# Patient Record
Sex: Female | Born: 1985
Health system: Southern US, Community
[De-identification: ages and names within clinical notes are randomized; demographics above are authoritative.]

## PROBLEM LIST (undated history)

## (undated) DIAGNOSIS — H5501 Congenital nystagmus: Secondary | ICD-10-CM

## (undated) DIAGNOSIS — C9590 Leukemia, unspecified not having achieved remission: Secondary | ICD-10-CM

## (undated) DIAGNOSIS — Q909 Down syndrome, unspecified: Secondary | ICD-10-CM

## (undated) DIAGNOSIS — C801 Malignant (primary) neoplasm, unspecified: Secondary | ICD-10-CM

## (undated) DIAGNOSIS — R011 Cardiac murmur, unspecified: Secondary | ICD-10-CM

## (undated) HISTORY — DX: Down syndrome, unspecified: Q90.9

## (undated) HISTORY — DX: Congenital nystagmus: H55.01

## (undated) HISTORY — DX: Leukemia, unspecified not having achieved remission: C95.90

## (undated) HISTORY — PX: HERNIA REPAIR: SHX51

## (undated) HISTORY — DX: Cardiac murmur, unspecified: R01.1

---
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
## 2004-01-07 ENCOUNTER — Encounter: Admission: RE | Admit: 2004-01-07 | Discharge: 2004-01-07 | Payer: Self-pay | Admitting: General Surgery

## 2004-07-10 ENCOUNTER — Ambulatory Visit (HOSPITAL_COMMUNITY): Admission: RE | Admit: 2004-07-10 | Discharge: 2004-07-10 | Payer: Self-pay | Admitting: General Surgery

## 2004-07-12 ENCOUNTER — Inpatient Hospital Stay (HOSPITAL_COMMUNITY): Admission: RE | Admit: 2004-07-12 | Discharge: 2004-07-18 | Payer: Self-pay | Admitting: General Surgery

## 2011-07-30 ENCOUNTER — Other Ambulatory Visit: Payer: Self-pay | Admitting: Endocrinology

## 2011-07-30 DIAGNOSIS — E0590 Thyrotoxicosis, unspecified without thyrotoxic crisis or storm: Secondary | ICD-10-CM

## 2011-08-02 ENCOUNTER — Encounter (HOSPITAL_COMMUNITY)
Admission: RE | Admit: 2011-08-02 | Discharge: 2011-08-02 | Disposition: A | Discharge status: Home / Self Care | Payer: Medicaid Other | Source: Ambulatory Visit | Attending: Endocrinology | Admitting: Endocrinology

## 2011-08-02 DIAGNOSIS — E0590 Thyrotoxicosis, unspecified without thyrotoxic crisis or storm: Secondary | ICD-10-CM

## 2011-08-02 MED ORDER — SODIUM IODIDE I 131 CAPSULE
6.0000 | Freq: Once | INTRAVENOUS | Status: AC | PRN
  Administered 2011-08-02: 6 via ORAL

## 2011-08-03 ENCOUNTER — Encounter (HOSPITAL_COMMUNITY): Payer: Self-pay

## 2011-08-03 ENCOUNTER — Encounter (HOSPITAL_COMMUNITY)
Admission: RE | Admit: 2011-08-03 | Discharge: 2011-08-03 | Disposition: A | Discharge status: Home / Self Care | Payer: Medicaid Other | Source: Ambulatory Visit | Attending: Endocrinology | Admitting: Endocrinology

## 2011-08-03 HISTORY — DX: Malignant (primary) neoplasm, unspecified: C80.1

## 2011-08-03 MED ORDER — SODIUM PERTECHNETATE TC 99M INJECTION
10.0000 | Freq: Once | INTRAVENOUS | Status: AC | PRN
  Administered 2011-08-03: 10.7 via INTRAVENOUS

## 2011-08-07 ENCOUNTER — Other Ambulatory Visit: Payer: Self-pay | Admitting: Endocrinology

## 2011-08-07 DIAGNOSIS — E0590 Thyrotoxicosis, unspecified without thyrotoxic crisis or storm: Secondary | ICD-10-CM

## 2011-08-17 ENCOUNTER — Encounter (HOSPITAL_COMMUNITY)
Admission: RE | Admit: 2011-08-17 | Discharge: 2011-08-17 | Disposition: A | Discharge status: Home / Self Care | Payer: Medicaid Other | Source: Ambulatory Visit | Attending: Endocrinology | Admitting: Endocrinology

## 2011-08-17 DIAGNOSIS — E0500 Thyrotoxicosis with diffuse goiter without thyrotoxic crisis or storm: Secondary | ICD-10-CM | POA: Insufficient documentation

## 2011-08-17 DIAGNOSIS — E0590 Thyrotoxicosis, unspecified without thyrotoxic crisis or storm: Secondary | ICD-10-CM

## 2011-08-17 MED ORDER — SODIUM IODIDE I 131 CAPSULE
24.3000 | Freq: Once | INTRAVENOUS | Status: AC | PRN
  Administered 2011-08-17: 24.3 via ORAL

## 2012-04-14 ENCOUNTER — Other Ambulatory Visit: Payer: Self-pay | Admitting: *Deleted

## 2012-04-14 MED ORDER — METFORMIN HCL 500 MG PO TABS: ORAL_TABLET | ORAL | Status: DC

## 2012-04-14 NOTE — Telephone Encounter (Signed)
LAST AIC 6/13 5.6

## 2012-04-14 NOTE — Telephone Encounter (Signed)
This patient is to be seen by someone in the next month.Please schedule appointment for him

## 2012-05-19 ENCOUNTER — Other Ambulatory Visit: Payer: Self-pay

## 2012-05-19 MED ORDER — METFORMIN HCL 500 MG PO TABS: ORAL_TABLET | ORAL | Status: DC

## 2012-05-19 NOTE — Telephone Encounter (Signed)
Last seen 06/13/11  Last refilled and told needed to be seen  No upcoming appt scheduled

## 2012-06-23 ENCOUNTER — Other Ambulatory Visit: Payer: Self-pay | Admitting: *Deleted

## 2012-06-23 MED ORDER — METFORMIN HCL 500 MG PO TABS: ORAL_TABLET | ORAL | Status: DC

## 2012-06-23 NOTE — Telephone Encounter (Signed)
LAST LABS 6/13. NTBS

## 2012-07-15 ENCOUNTER — Ambulatory Visit (INDEPENDENT_AMBULATORY_CARE_PROVIDER_SITE_OTHER): Payer: Medicaid Other | Admitting: General Practice

## 2012-07-15 ENCOUNTER — Encounter: Payer: Self-pay | Admitting: General Practice

## 2012-07-15 VITALS — BP 118/77 | HR 85 | Temp 99.0°F | Wt 244.0 lb

## 2012-07-15 DIAGNOSIS — E559 Vitamin D deficiency, unspecified: Secondary | ICD-10-CM

## 2012-07-15 DIAGNOSIS — E119 Type 2 diabetes mellitus without complications: Secondary | ICD-10-CM

## 2012-07-15 DIAGNOSIS — E079 Disorder of thyroid, unspecified: Secondary | ICD-10-CM

## 2012-07-15 MED ORDER — METFORMIN HCL 500 MG PO TABS: ORAL_TABLET | ORAL | Status: DC

## 2012-07-15 NOTE — Progress Notes (Signed)
  Subjective:    Patient ID: Julie Carter, female    DOB: 1985-10-15, 27 y.o.   MRN: [id number]  HPI Patient presents today for chronic health follow up, she has down syndrome and accompanied by her mother. She has a history of hypothyroidism and diabetes. Patient's mother denies complaints or problems and patient is taking medications as prescribed.     Review of Systems  Constitutional: Negative for fever and chills.  HENT: Negative for neck pain and neck stiffness.   Respiratory: Negative for chest tightness and shortness of breath.   Cardiovascular: Negative for chest pain and palpitations.  Gastrointestinal: Negative for nausea, vomiting, abdominal pain and blood in stool.  Genitourinary: Negative for hematuria and difficulty urinating.  Neurological: Negative for dizziness, weakness and headaches.       Objective:   Physical Exam  Constitutional: She appears well-developed and well-nourished.  Patient isn't very verbal, but able to answer some questions appropriately  HENT:  Head: Normocephalic and atraumatic.  Eyes: Conjunctivae are normal.  Neck: Normal range of motion. Neck supple. No thyromegaly present.  Cardiovascular: Normal rate, regular rhythm and normal heart sounds.   Pulmonary/Chest: Effort normal and breath sounds normal. No respiratory distress. She exhibits no tenderness.  Abdominal: Soft. Bowel sounds are normal. She exhibits no distension. There is no tenderness.  Obese abdomen  Lymphadenopathy:    She has no cervical adenopathy.  Neurological: She is alert.  Oriented to self  Skin: Skin is warm and dry.  Psychiatric: She has a normal mood and affect.           Assessment & Plan:  1. Diabetes mellitus, controlled - COMPLETE METABOLIC PANEL WITH GFR - NMR Lipoprofile with Lipids - POCT glycosylated hemoglobin (Hb A1C) - metFORMIN (GLUCOPHAGE) 500 MG tablet; TAKE 1/2 TABLET BY MOUTH TWICE DAILY  Dispense: 30 tablet; Refill: 2  2. Thyroid  disorder - Thyroid Panel With TSH  3. Unspecified vitamin D deficiency - Vitamin D 25 hydroxy -Continue all current medications Labs pending, cmp, lipid panel F/u in 3 months Discussed exercise and healthy eating habits Patient's mother verbalized understanding Coralie Keens, FNP-C

## 2012-07-15 NOTE — Patient Instructions (Signed)

## 2012-07-16 LAB — COMPLETE METABOLIC PANEL WITH GFR
AST: 19 U/L (ref 0–37)
Albumin: 4 g/dL (ref 3.5–5.2)
BUN: 10 mg/dL (ref 6–23)
Calcium: 9.4 mg/dL (ref 8.4–10.5)
Chloride: 103 mEq/L (ref 96–112)
Creat: 0.81 mg/dL (ref 0.50–1.10)
GFR, Est African American: >89 mL/min
GFR, Est Non African American: >89 mL/min
Glucose, Bld: 86 mg/dL (ref 70–99)

## 2012-07-16 LAB — NMR LIPOPROFILE WITH LIPIDS
HDL Size: 9.4 nm (ref >=9.2)
LDL (calc): 76 mg/dL (ref <100)
LDL Particle Number: 1059 nmol/L — ABNORMAL HIGH (ref <1000)
LDL Size: 20.9 nm (ref >20.5)
LP-IR Score: 51 — ABNORMAL HIGH (ref <=45)
Large VLDL-P: 6 nmol/L — ABNORMAL HIGH (ref <=2.7)
Small LDL Particle Number: 491 nmol/L (ref <=527)
VLDL Size: 47.9 nm — ABNORMAL HIGH (ref <=46.6)

## 2012-07-22 ENCOUNTER — Other Ambulatory Visit: Payer: Self-pay | Admitting: General Practice

## 2012-07-22 DIAGNOSIS — E559 Vitamin D deficiency, unspecified: Secondary | ICD-10-CM

## 2012-07-22 MED ORDER — VITAMIN D3 1.25 MG (50000 UT) PO CAPS: 1.0000 | ORAL_CAPSULE | ORAL | Status: DC

## 2012-07-22 NOTE — Progress Notes (Signed)
Patient mom aware.

## 2012-09-12 ENCOUNTER — Telehealth: Payer: Self-pay | Admitting: Family Medicine

## 2012-09-12 ENCOUNTER — Other Ambulatory Visit: Payer: Self-pay | Admitting: *Deleted

## 2012-09-12 MED ORDER — GLUCOSE BLOOD VI STRP: ORAL_STRIP | Status: DC

## 2012-09-26 ENCOUNTER — Other Ambulatory Visit: Payer: Self-pay

## 2012-09-26 DIAGNOSIS — E119 Type 2 diabetes mellitus without complications: Secondary | ICD-10-CM

## 2012-09-26 MED ORDER — METFORMIN HCL 500 MG PO TABS: ORAL_TABLET | ORAL | Status: DC

## 2012-10-03 ENCOUNTER — Other Ambulatory Visit: Payer: Self-pay | Admitting: *Deleted

## 2012-10-03 DIAGNOSIS — E039 Hypothyroidism, unspecified: Secondary | ICD-10-CM

## 2012-10-03 DIAGNOSIS — E119 Type 2 diabetes mellitus without complications: Secondary | ICD-10-CM

## 2012-10-03 DIAGNOSIS — E890 Postprocedural hypothyroidism: Secondary | ICD-10-CM | POA: Insufficient documentation

## 2012-10-09 ENCOUNTER — Other Ambulatory Visit (INDEPENDENT_AMBULATORY_CARE_PROVIDER_SITE_OTHER): Payer: Medicaid Other

## 2012-10-09 DIAGNOSIS — E039 Hypothyroidism, unspecified: Secondary | ICD-10-CM

## 2012-10-14 ENCOUNTER — Encounter: Payer: Self-pay | Admitting: Endocrinology

## 2012-10-14 ENCOUNTER — Ambulatory Visit (INDEPENDENT_AMBULATORY_CARE_PROVIDER_SITE_OTHER): Payer: Medicaid Other | Admitting: Endocrinology

## 2012-10-14 VITALS — BP 118/52 | HR 86 | Temp 98.2°F | Resp 12 | Ht <= 58 in | Wt 243.7 lb

## 2012-10-14 DIAGNOSIS — E039 Hypothyroidism, unspecified: Secondary | ICD-10-CM

## 2012-10-14 NOTE — Patient Instructions (Signed)
No change 

## 2012-10-14 NOTE — Progress Notes (Signed)
Patient ID: Julie Carter, female   DOB: [birth year], 27 y.o.   MRN: [id number]  Reason for Appointment:  Hypothyroidism, followup visit    History of Present Illness:   The hypothyroidism was first diagnosed in  2013 after her I-131 treatment for Graves' disease in 08/2011  She has had some increase in her thyroid dosage done after initial therapy was started Difficult to assess her symptoms because of her mental retardation She has difficulty with her obesity and tends to gain weight Currently the patient does not complain of unusual sleepiness or cold intolerance Previous records are currently not available, her dose was not changed on her last visit about 6 months ago         Compliance with the medical regimen has been as prescribed with taking the tablet in the morning before breakfast.  Appointment on 10/09/2012  Component Date Value Range Status  . TSH 10/09/2012 1.05  0.35 - 5.50 uIU/mL Final  . Free T4 10/09/2012 1.21  0.60 - 1.60 ng/dL Final      Medication List       This list is accurate as of: 10/14/12  9:32 AM.  Always use your most recent med list.               glucose blood test strip  Use as instructed     levothyroxine 137 MCG tablet  Commonly known as:  SYNTHROID, LEVOTHROID  Take 137 mcg by mouth daily before breakfast. Take 1 pill every day and 2 pills on Sunday     metFORMIN 500 MG tablet  Commonly known as:  GLUCOPHAGE  TAKE 1/2 TABLET BY MOUTH TWICE DAILY        Past Medical History  Diagnosis Date  . Cancer   . Diabetes mellitus     No past surgical history on file.  No family history on file.  Social History:  reports that she has never smoked. She does not have any smokeless tobacco history on file. Her alcohol and drug histories are not on file.  Allergies: No Known Allergies  REVIEW of systems:  She has ha mild diabetes followed by PCP, apparently blood sugars are fairly good at home checked by her mother   Examination:    BP 118/52  Pulse 86  Temp(Src) 98.2 F (36.8 C)  Resp 12  Ht 4' 8.5" (1.435 m)  Wt 243 lb 11.2 oz (110.542 kg)  BMI 53.68 kg/m2  SpO2 98%  LMP 06/14/2012   GENERAL APPEARANCE:  has generalized obesity, she is alert  FACE: No puffiness of face or periorbital edema.         NECK:  no mass palpable in the neck         NEUROLOGIC EXAM: DTRs appear normal bilaterally at biceps, slightly difficult to elicit.    Assessments   Hypothyroidism, post ablative with normal TSH and no clinical symptoms of hypothyroidism She has been very compliant with her thyroid supplement    Treatment:   Continue same dosage before breakfast daily. Avoid taking any calcium or iron supplements with the thyroid supplement.   Followup annually  [facility] 10/14/2012, 9:32 AM

## 2012-10-16 ENCOUNTER — Encounter: Payer: Self-pay | Admitting: General Practice

## 2012-10-16 ENCOUNTER — Ambulatory Visit (INDEPENDENT_AMBULATORY_CARE_PROVIDER_SITE_OTHER): Payer: Medicaid Other | Admitting: General Practice

## 2012-10-16 VITALS — BP 134/71 | HR 68 | Temp 97.3°F | Ht <= 58 in | Wt 243.5 lb

## 2012-10-16 DIAGNOSIS — E119 Type 2 diabetes mellitus without complications: Secondary | ICD-10-CM

## 2012-10-16 MED ORDER — METFORMIN HCL 500 MG PO TABS: ORAL_TABLET | ORAL | Status: DC

## 2012-10-16 MED ORDER — GLUCOSE BLOOD VI STRP: ORAL_STRIP | Status: DC

## 2012-10-16 NOTE — Patient Instructions (Signed)

## 2012-10-16 NOTE — Progress Notes (Signed)
  Subjective:    Patient ID: Julie Carter, female    DOB: 02-09-1985, 27 y.o.   MRN: [id number]  HPI Patient presents today for 3 month chronic health follow up. Julie Carter has Down Syndrome and is accompanied by her mother. Julie Carter has a history of hyperthyroidism and diabetes type II. Thyroid disease is managed by endocrinologist. Patient's mother reports medications are taken as prescribed. Julie Carter denies any physical activity, she enjoys playing her ipad. Mom reports trying to encourage Julie Carter to walk, due to her obese size and better for health, but doesn't have much success. Julie Carter does respond to some questions appropriately with yes or no answers.     Review of Systems  Constitutional: Negative for fever and chills.  Respiratory: Negative for cough, chest tightness, shortness of breath and wheezing.   Cardiovascular: Negative for chest pain and palpitations.  Gastrointestinal: Negative for abdominal pain, diarrhea, constipation and blood in stool.       Obese abdomen  Genitourinary: Negative for dysuria, hematuria and difficulty urinating.  Musculoskeletal: Negative for back pain, neck pain and neck stiffness.  Neurological: Negative for dizziness, weakness and headaches.       Objective:   Physical Exam  Constitutional: She appears well-developed and well-nourished.  Patient isn't very verbal, but able to answer some questions appropriately  HENT:  Head: Normocephalic and atraumatic.  Eyes: Conjunctivae are normal.  Neck: Normal range of motion. Neck supple. No thyromegaly present.  Cardiovascular: Normal rate, regular rhythm and normal heart sounds.   Pulmonary/Chest: Effort normal and breath sounds normal. No respiratory distress. She exhibits no tenderness.  Abdominal: Soft. Bowel sounds are normal. She exhibits no distension. There is no tenderness.  Obese abdomen  Lymphadenopathy:    She has no cervical adenopathy.  Neurological: She is alert.  Oriented to self  Skin:  Skin is warm and dry.  Psychiatric: She has a normal mood and affect.          Assessment & Plan:  1. Diabetes  - POCT glycosylated hemoglobin (Hb A1C) - CMP14+EGFR  2. Diabetes mellitus, controlled  - glucose blood test strip; Use as instructed  Dispense: 100 each; Refill: 12 - metFORMIN (GLUCOPHAGE) 500 MG tablet; TAKE 1/2 TABLET BY MOUTH TWICE DAILY  Dispense: 30 tablet; Refill: 3 -Continue all current medications Labs pending, cmp, lipid panel F/u in 3 months Discussed exercise and diet  Encouraged her to walk for exercise as tolerated Patient's mother verbalized understanding Coralie Keens, FNP-C

## 2012-10-17 LAB — CMP14+EGFR
Albumin: 4.3 g/dL (ref 3.5–5.5)
Alkaline Phosphatase: 88 IU/L (ref 39–117)
BUN/Creatinine Ratio: 12 (ref 8–20)
BUN: 9 mg/dL (ref 6–20)
Chloride: 100 mmol/L (ref 97–108)
Creatinine, Ser: 0.78 mg/dL (ref 0.57–1.00)
GFR calc Af Amer: 120 mL/min/{1.73_m2} (ref >59)
Globulin, Total: 2.8 g/dL (ref 1.5–4.5)
Glucose: 84 mg/dL (ref 65–99)
Total Bilirubin: 0.2 mg/dL (ref 0.0–1.2)
Total Protein: 7.1 g/dL (ref 6.0–8.5)

## 2012-10-23 ENCOUNTER — Encounter: Payer: Self-pay | Admitting: *Deleted

## 2012-10-28 ENCOUNTER — Encounter: Payer: Self-pay | Admitting: *Deleted

## 2013-02-13 ENCOUNTER — Other Ambulatory Visit: Payer: Self-pay | Admitting: *Deleted

## 2013-02-13 MED ORDER — LEVOTHYROXINE SODIUM 137 MCG PO TABS: 137.0000 ug | ORAL_TABLET | Freq: Every day | ORAL | Status: DC

## 2013-03-04 ENCOUNTER — Encounter: Payer: Self-pay | Admitting: *Deleted

## 2013-04-15 ENCOUNTER — Ambulatory Visit (INDEPENDENT_AMBULATORY_CARE_PROVIDER_SITE_OTHER): Payer: Medicaid Other | Admitting: General Practice

## 2013-04-15 ENCOUNTER — Encounter: Payer: Self-pay | Admitting: General Practice

## 2013-04-15 VITALS — BP 127/74 | HR 85 | Temp 98.2°F | Ht <= 58 in | Wt 259.6 lb

## 2013-04-15 DIAGNOSIS — E119 Type 2 diabetes mellitus without complications: Secondary | ICD-10-CM

## 2013-04-15 LAB — POCT CBC
Granulocyte percent: 76.2 %G (ref 37–80)
HCT, POC: 39.4 % (ref 37.7–47.9)
Hemoglobin: 13 g/dL (ref 12.2–16.2)
LYMPH, POC: 1.4 (ref 0.6–3.4)
MCH, POC: 30.9 pg (ref 27–31.2)
MCHC: 32.9 g/dL (ref 31.8–35.4)
MCV: 93.8 fL (ref 80–97)
MPV: 6.9 fL (ref 0–99.8)
PLATELET COUNT, POC: 371 10*3/uL (ref 142–424)
POC GRANULOCYTE: 5 (ref 2–6.9)
POC LYMPH PERCENT: 20.8 %L (ref 10–50)
RBC: 4.2 M/uL (ref 4.04–5.48)
RDW, POC: 16.1 %
WBC: 6.6 10*3/uL (ref 4.6–10.2)

## 2013-04-15 LAB — POCT GLYCOSYLATED HEMOGLOBIN (HGB A1C)

## 2013-04-15 NOTE — Patient Instructions (Signed)

## 2013-04-15 NOTE — Progress Notes (Signed)
   Subjective:    Patient ID: Julie Carter, female    DOB: 1985/12/06, 28 y.o.   MRN: [id number]  HPI Patient presents today for 3 month chronic health follow up. Julie Carter has Down Syndrome and is accompanied by her mother. History of thyroid and diabetes type II. Thyroid disease is managed by endocrinologist. Patient's mother reports medications are taken as prescribed. Blood sugars checked daily and range 120's. Julie Carter does respond to some questions appropriately with yes or no answers. Mother denies regular physical activity, tries to provide healthy foods.      Review of Systems  Constitutional: Negative for fever and chills.  Respiratory: Negative for cough, chest tightness, shortness of breath and wheezing.   Cardiovascular: Negative for chest pain and palpitations.  Gastrointestinal: Negative for abdominal pain, diarrhea, constipation and blood in stool.       Obese abdomen  Genitourinary: Negative for dysuria, hematuria and difficulty urinating.  Musculoskeletal: Negative for back pain, neck pain and neck stiffness.  Neurological: Negative for dizziness, weakness and headaches.       Objective:   Physical Exam  Constitutional: She appears well-developed and well-nourished.  Patient isn't very verbal, but able to answer some questions appropriately  HENT:  Head: Normocephalic and atraumatic.  Eyes: Conjunctivae are normal.  Neck: Normal range of motion. Neck supple. No thyromegaly present.  Cardiovascular: Normal rate, regular rhythm and normal heart sounds.   Pulmonary/Chest: Effort normal and breath sounds normal. No respiratory distress. She exhibits no tenderness.  Abdominal: Soft. Bowel sounds are normal. She exhibits no distension. There is no tenderness.  Obese abdomen  Lymphadenopathy:    She has no cervical adenopathy.  Neurological: She is alert.  Oriented to self  Skin: Skin is warm and dry.  Psychiatric: She has a normal mood and affect.            Assessment & Plan:  1. Diabetes mellitus, type 2 - POCT CBC - CMP14+EGFR - POCT glycosylated hemoglobin (Hb A1C) - Lipid panel Continue all current medications Labs pending F/u in 3 months Discussed benefits of regular exercise and healthy eating Patient's guardian verbalized understanding Erby Pian, FNP-C

## 2013-04-16 ENCOUNTER — Ambulatory Visit: Payer: Medicaid Other | Admitting: General Practice

## 2013-04-16 LAB — CMP14+EGFR
ALBUMIN: 4.3 g/dL (ref 3.5–5.5)
ALK PHOS: 81 IU/L (ref 39–117)
ALT: 48 IU/L — ABNORMAL HIGH (ref 0–32)
AST: 28 IU/L (ref 0–40)
Albumin/Globulin Ratio: 1.4 (ref 1.1–2.5)
BUN / CREAT RATIO: 13 (ref 8–20)
BUN: 11 mg/dL (ref 6–20)
CHLORIDE: 102 mmol/L (ref 97–108)
CO2: 26 mmol/L (ref 18–29)
Calcium: 9.6 mg/dL (ref 8.7–10.2)
Creatinine, Ser: 0.84 mg/dL (ref 0.57–1.00)
GFR calc Af Amer: 109 mL/min/{1.73_m2} (ref >59)
GFR calc non Af Amer: 94 mL/min/{1.73_m2} (ref >59)
Globulin, Total: 3 g/dL (ref 1.5–4.5)
Glucose: 134 mg/dL — ABNORMAL HIGH (ref 65–99)
POTASSIUM: 4.9 mmol/L (ref 3.5–5.2)
Sodium: 144 mmol/L (ref 134–144)
Total Bilirubin: 0.3 mg/dL (ref 0.0–1.2)
Total Protein: 7.3 g/dL (ref 6.0–8.5)

## 2013-04-16 LAB — LIPID PANEL
Chol/HDL Ratio: 3.2 ratio units (ref 0.0–4.4)
Cholesterol, Total: 162 mg/dL (ref 100–199)
HDL: 50 mg/dL (ref >39)
LDL Calculated: 86 mg/dL (ref 0–99)
Triglycerides: 131 mg/dL (ref 0–149)
VLDL Cholesterol Cal: 26 mg/dL (ref 5–40)

## 2013-04-17 ENCOUNTER — Ambulatory Visit: Payer: Medicaid Other | Admitting: General Practice

## 2013-04-17 ENCOUNTER — Encounter: Payer: Self-pay | Admitting: General Practice

## 2013-04-22 ENCOUNTER — Other Ambulatory Visit: Payer: Self-pay | Admitting: *Deleted

## 2013-04-22 DIAGNOSIS — E119 Type 2 diabetes mellitus without complications: Secondary | ICD-10-CM

## 2013-04-22 MED ORDER — METFORMIN HCL 500 MG PO TABS: ORAL_TABLET | ORAL | Status: DC

## 2013-06-26 ENCOUNTER — Other Ambulatory Visit: Payer: Self-pay | Admitting: *Deleted

## 2013-06-26 DIAGNOSIS — E119 Type 2 diabetes mellitus without complications: Secondary | ICD-10-CM

## 2013-06-26 MED ORDER — METFORMIN HCL 500 MG PO TABS: ORAL_TABLET | ORAL | Status: DC

## 2013-08-27 ENCOUNTER — Other Ambulatory Visit: Payer: Self-pay | Admitting: Nurse Practitioner

## 2013-08-28 NOTE — Telephone Encounter (Signed)
Last seen and last glucose 04/15/13  Julie Carter

## 2013-08-31 NOTE — Telephone Encounter (Signed)
Patient NTBS for follow up and lab work  

## 2013-10-02 ENCOUNTER — Other Ambulatory Visit: Payer: Self-pay | Admitting: Nurse Practitioner

## 2013-10-05 NOTE — Telephone Encounter (Signed)
no more refills without being seen  

## 2013-10-12 ENCOUNTER — Other Ambulatory Visit: Payer: Medicaid Other

## 2013-10-13 ENCOUNTER — Other Ambulatory Visit (INDEPENDENT_AMBULATORY_CARE_PROVIDER_SITE_OTHER): Payer: Medicaid Other

## 2013-10-13 DIAGNOSIS — E039 Hypothyroidism, unspecified: Secondary | ICD-10-CM

## 2013-10-13 LAB — TSH: TSH: 1.16 u[IU]/mL (ref 0.35–4.50)

## 2013-10-13 LAB — T4, FREE: Free T4: 1.42 ng/dL (ref 0.60–1.60)

## 2013-10-14 ENCOUNTER — Ambulatory Visit (INDEPENDENT_AMBULATORY_CARE_PROVIDER_SITE_OTHER): Payer: Medicaid Other | Admitting: Endocrinology

## 2013-10-14 ENCOUNTER — Encounter: Payer: Self-pay | Admitting: Endocrinology

## 2013-10-14 VITALS — BP 116/70 | HR 93 | Temp 98.0°F | Resp 14 | Ht <= 58 in | Wt 264.0 lb

## 2013-10-14 DIAGNOSIS — E890 Postprocedural hypothyroidism: Secondary | ICD-10-CM

## 2013-10-14 NOTE — Progress Notes (Signed)
Patient ID: Julie Carter, female   DOB: 1985/09/21, 28 y.o.   MRN: [id number]  Reason for Appointment:  Hypothyroidism, followup visit    History of Present Illness:   The hypothyroidism was first diagnosed in  2013 after her I-131 treatment for Graves' disease in 08/2011  She did need an increase in her thyroid dosage done after initial therapy was started Difficult to assess her symptoms because of her mental retardation She has difficulty with her obesity and continues to gain weight Currently the patient does not complain of unusual fatigue or cold intolerance  Her dose was not changed on her visit a year ago          Compliance with the medical regimen has been as prescribed with taking the tablet in the morning before breakfast. Not on any iron or vitamins Her TSH is again fairly good  Appointment on 10/13/2013  Component Date Value Ref Range Status  . Free T4 10/13/2013 1.42  0.60 - 1.60 ng/dL Final  . TSH 10/13/2013 1.16  0.35 - 4.50 uIU/mL Final   Lab Results  Component Value Date   FREET4 1.42 10/13/2013   FREET4 1.21 10/09/2012   TSH 1.16 10/13/2013   TSH 1.05 10/09/2012     Medication List       This list is accurate as of: 10/14/13 11:19 AM.  Always use your most recent med list.               glucose blood test strip  Use as instructed     levothyroxine 137 MCG tablet  Commonly known as:  SYNTHROID, LEVOTHROID  Take 1 tablet (137 mcg total) by mouth daily before breakfast. Take 1 pill every day and 2 pills on Sunday     metFORMIN 500 MG tablet  Commonly known as:  GLUCOPHAGE  TAKE ONE-HALF (1/2) TABLET (250MG ) BY MOUTH TWICE DAILY        Past Medical History  Diagnosis Date  . Cancer   . Diabetes mellitus   . Down's syndrome   . Congenital nystagmus   . Leukemia   . Heart murmur     Past Surgical History  Procedure Laterality Date  . Hernia repair      umbilical    Family History  Problem Relation Age of Onset  . Diabetes Mother    . Hypertension Mother     Social History:  reports that she has never smoked. She does not have any smokeless tobacco history on file. She reports that she does not drink alcohol or use illicit drugs.  Allergies: No Known Allergies  REVIEW of systems:  She has has mild diabetes followed by PCP, apparently blood sugars are fairly good at home checked by her mother 120-130   Examination:   BP 116/70  Pulse 93  Temp(Src) 98 F (36.7 C)  Resp 14  Ht 4' 8.5" (1.435 m)  Wt 264 lb (119.75 kg)  BMI 58.15 kg/m2  SpO2 93%   GENERAL APPEARANCE:  has generalized obesity, she is  Communicative but not very alert    No puffiness of face or  parents             Assessments   Hypothyroidism, post ablative with now consistently  normal TSH and no clinical symptoms of hypothyroidism She has been very compliant with her thyroid supplement    Obesity: She has gained about 20 pounds in one year and advised her to discuss this with her PCP   Treatment:  Continue same dosage before breakfast daily. Avoid taking any calcium or iron supplements with the thyroid supplement.   Followup annually  [facility] 10/14/2013, 11:19 AM

## 2013-10-14 NOTE — Patient Instructions (Signed)
   Continue same dosage before breakfast daily. Avoid taking any calcium or iron supplements with the thyroid supplement.   Followup annually

## 2013-11-07 ENCOUNTER — Other Ambulatory Visit: Payer: Self-pay | Admitting: Nurse Practitioner

## 2013-12-04 ENCOUNTER — Telehealth: Payer: Self-pay | Admitting: Endocrinology

## 2013-12-04 ENCOUNTER — Other Ambulatory Visit: Payer: Self-pay | Admitting: *Deleted

## 2013-12-04 MED ORDER — LEVOTHYROXINE SODIUM 137 MCG PO TABS: ORAL_TABLET | ORAL | Status: DC

## 2013-12-04 NOTE — Telephone Encounter (Signed)
Patient need refill of Levothroixine

## 2013-12-04 NOTE — Telephone Encounter (Signed)
rx sent

## 2014-01-04 ENCOUNTER — Other Ambulatory Visit: Payer: Self-pay | Admitting: Family Medicine

## 2014-01-05 ENCOUNTER — Telehealth: Payer: Self-pay | Admitting: Family

## 2014-01-05 MED ORDER — METFORMIN HCL 500 MG PO TABS: 250.0000 mg | ORAL_TABLET | Freq: Two times a day (BID) | ORAL | Status: DC

## 2014-01-05 NOTE — Telephone Encounter (Signed)
done

## 2014-01-16 ENCOUNTER — Other Ambulatory Visit: Payer: Self-pay | Admitting: Nurse Practitioner

## 2014-01-16 MED ORDER — AMOXICILLIN 400 MG/5ML PO SUSR: ORAL | Status: DC

## 2014-02-02 ENCOUNTER — Encounter: Payer: Self-pay | Admitting: Family

## 2014-02-02 ENCOUNTER — Ambulatory Visit (INDEPENDENT_AMBULATORY_CARE_PROVIDER_SITE_OTHER): Payer: Medicaid Other | Admitting: Family

## 2014-02-02 VITALS — BP 127/77 | HR 95 | Temp 96.7°F | Ht <= 58 in | Wt 265.4 lb

## 2014-02-02 DIAGNOSIS — E119 Type 2 diabetes mellitus without complications: Secondary | ICD-10-CM

## 2014-02-02 DIAGNOSIS — Z1321 Encounter for screening for nutritional disorder: Secondary | ICD-10-CM

## 2014-02-02 DIAGNOSIS — Q909 Down syndrome, unspecified: Secondary | ICD-10-CM

## 2014-02-02 DIAGNOSIS — E890 Postprocedural hypothyroidism: Secondary | ICD-10-CM

## 2014-02-02 LAB — POCT GLYCOSYLATED HEMOGLOBIN (HGB A1C): HEMOGLOBIN A1C: 7

## 2014-02-02 LAB — POCT UA - MICROALBUMIN: MICROALBUMIN (UR) POC: 20 mg/L

## 2014-02-02 MED ORDER — METFORMIN HCL 500 MG PO TABS: 250.0000 mg | ORAL_TABLET | Freq: Two times a day (BID) | ORAL | Status: DC

## 2014-02-02 NOTE — Patient Instructions (Signed)

## 2014-02-02 NOTE — Addendum Note (Signed)
Addended by: Earlene Plater on: 02/02/2014 12:42 PM   Modules accepted: Orders

## 2014-02-02 NOTE — Progress Notes (Signed)
Subjective:    Patient ID: Julie Carter, female    DOB: 1985/02/09, 29 y.o.   MRN: [id number]  Pt presents for a chronic follow up with lab work. Pt has down syndrome.  Diabetes She presents for her follow-up diabetic visit. She has type 2 diabetes mellitus. Her disease course has been stable. Pertinent negatives for hypoglycemia include no confusion, dizziness, headaches or nervousness/anxiousness. Pertinent negatives for diabetes include no blurred vision, no fatigue, no foot paresthesias, no foot ulcerations and no visual change. Pertinent negatives for hypoglycemia complications include no blackouts and no hospitalization. Symptoms are stable. Pertinent negatives for diabetic complications include no CVA, heart disease, nephropathy or peripheral neuropathy. Risk factors for coronary artery disease include obesity. Current diabetic treatment includes oral agent (monotherapy). She is compliant with treatment all of the time. Her weight is increasing steadily. She is following a generally unhealthy diet. She rarely participates in exercise. Her breakfast blood glucose range is generally 110-130 mg/dl. Eye exam is not current.  Thyroid Problem Presents for follow-up visit. Symptoms include dry skin. Patient reports no anxiety, constipation, depressed mood, diarrhea, fatigue, heat intolerance, nail problem, palpitations or visual change. The symptoms have been stable. Past treatments include levothyroxine. The treatment provided significant relief. Her past medical history is significant for diabetes and obesity. There is no history of hyperlipidemia.    *Pt has an endocrinology   that she see's once a year who manages her thyroid.   Review of Systems  Constitutional: Negative.  Negative for fatigue.  HENT: Negative.   Eyes: Negative.  Negative for blurred vision.  Respiratory: Negative.  Negative for shortness of breath.   Cardiovascular: Negative.  Negative for palpitations.    Gastrointestinal: Negative.  Negative for diarrhea and constipation.  Endocrine: Negative.  Negative for heat intolerance.  Genitourinary: Negative.   Musculoskeletal: Negative.   Neurological: Negative.  Negative for dizziness and headaches.  Hematological: Negative.   Psychiatric/Behavioral: Negative.  Negative for confusion. The patient is not nervous/anxious.   All other systems reviewed and are negative.      Objective:   Physical Exam  Constitutional: She is oriented to person, place, and time. She appears well-developed and well-nourished. No distress.  HENT:  Head: Normocephalic and atraumatic.  Right Ear: External ear normal.  Left Ear: External ear normal.  Nose: Nose normal.  Mouth/Throat: Oropharynx is clear and moist.  Eyes: Pupils are equal, round, and reactive to light.  Neck: Normal range of motion. Neck supple. No thyromegaly present.  Cardiovascular: Normal rate, regular rhythm, normal heart sounds and intact distal pulses.   No murmur heard. Pulmonary/Chest: Effort normal and breath sounds normal. No respiratory distress. She has no wheezes.  Abdominal: Soft. Bowel sounds are normal. She exhibits no distension. There is no tenderness.  Musculoskeletal: Normal range of motion. She exhibits no edema or tenderness.  Neurological: She is alert and oriented to person, place, and time. She has normal reflexes. No cranial nerve deficit.  Skin: Skin is warm and dry.  Psychiatric: She has a normal mood and affect. Her behavior is normal. Judgment and thought content normal.  Vitals reviewed.   BP 127/77 mmHg  Pulse 95  Temp(Src) 96.7 F (35.9 C) (Oral)  Ht 4' 8.5" (1.435 m)  Wt 265 lb 6.4 oz (120.385 kg)  BMI 58.46 kg/m2       Assessment & Plan:  1. Hypothyroidism, postradioiodine therapy - CMP14+EGFR  2. Type 2 diabetes mellitus without complication - POCT glycosylated hemoglobin (Hb A1C) -  POCT UA - Microalbumin - CMP14+EGFR - metFORMIN (GLUCOPHAGE)  500 MG tablet; Take 0.5 tablets (250 mg total) by mouth 2 (two) times daily.  Dispense: 90 tablet; Refill: 3  3. Encounter for vitamin deficiency screening - Vit D  25 hydroxy (rtn osteoporosis monitoring)  4. Down syndrome  5. Morbid obesity -Encouraged activity and daily exericse   Continue all meds Labs pending Health Maintenance reviewed- Pt encouraged to have eye exam Diet and exercise encouraged RTO 6 months  Evelina Dun, FNP

## 2014-02-03 ENCOUNTER — Other Ambulatory Visit: Payer: Self-pay | Admitting: Family

## 2014-02-03 DIAGNOSIS — E559 Vitamin D deficiency, unspecified: Secondary | ICD-10-CM

## 2014-02-03 LAB — CMP14+EGFR
ALK PHOS: 79 IU/L (ref 39–117)
ALT: 62 IU/L — ABNORMAL HIGH (ref 0–32)
AST: 34 IU/L (ref 0–40)
Albumin/Globulin Ratio: 1.3 (ref 1.1–2.5)
Albumin: 4 g/dL (ref 3.5–5.5)
BILIRUBIN TOTAL: 0.3 mg/dL (ref 0.0–1.2)
BUN/Creatinine Ratio: 12 (ref 8–20)
BUN: 9 mg/dL (ref 6–20)
CALCIUM: 9.6 mg/dL (ref 8.7–10.2)
CHLORIDE: 100 mmol/L (ref 97–108)
CO2: 26 mmol/L (ref 18–29)
CREATININE: 0.74 mg/dL (ref 0.57–1.00)
GFR, EST AFRICAN AMERICAN: 126 mL/min/{1.73_m2} (ref >59)
GFR, EST NON AFRICAN AMERICAN: 109 mL/min/{1.73_m2} (ref >59)
GLUCOSE: 134 mg/dL — AB (ref 65–99)
Globulin, Total: 3.1 g/dL (ref 1.5–4.5)
Potassium: 4.7 mmol/L (ref 3.5–5.2)
Sodium: 143 mmol/L (ref 134–144)
TOTAL PROTEIN: 7.1 g/dL (ref 6.0–8.5)

## 2014-02-03 LAB — VITAMIN D 25 HYDROXY (VIT D DEFICIENCY, FRACTURES): VIT D 25 HYDROXY: 12.6 ng/mL — AB (ref 30.0–100.0)

## 2014-02-03 LAB — MICROALBUMIN, URINE: Microalbumin, Urine: 4.6 ug/mL (ref 0.0–17.0)

## 2014-02-03 MED ORDER — VITAMIN D (ERGOCALCIFEROL) 1.25 MG (50000 UNIT) PO CAPS: 50000.0000 [IU] | ORAL_CAPSULE | ORAL | Status: DC

## 2014-10-11 ENCOUNTER — Other Ambulatory Visit: Payer: Medicaid Other

## 2014-10-15 ENCOUNTER — Encounter: Payer: Self-pay | Admitting: *Deleted

## 2014-10-15 ENCOUNTER — Telehealth: Payer: Self-pay | Admitting: Endocrinology

## 2014-10-15 ENCOUNTER — Ambulatory Visit: Payer: Medicaid Other | Admitting: Endocrinology

## 2014-10-15 NOTE — Telephone Encounter (Signed)
Patient no showed today's appt. Please advise on how to follow up. °A. No follow up necessary. °B. Follow up urgent. Contact patient immediately. °C. Follow up necessary. Contact patient and schedule visit in ___ days. °D. Follow up advised. Contact patient and schedule visit in ____weeks. ° °

## 2014-10-15 NOTE — Telephone Encounter (Signed)
Letter mailed

## 2014-10-18 NOTE — Telephone Encounter (Signed)
Needs to be rescheduled at first available appointment

## 2014-10-20 ENCOUNTER — Other Ambulatory Visit: Payer: Self-pay | Admitting: *Deleted

## 2014-10-20 ENCOUNTER — Other Ambulatory Visit (INDEPENDENT_AMBULATORY_CARE_PROVIDER_SITE_OTHER): Payer: Medicaid Other

## 2014-10-20 DIAGNOSIS — E890 Postprocedural hypothyroidism: Secondary | ICD-10-CM

## 2014-10-20 LAB — T4, FREE: FREE T4: 1.13 ng/dL (ref 0.60–1.60)

## 2014-10-20 LAB — TSH: TSH: 1.65 u[IU]/mL (ref 0.35–4.50)

## 2014-10-26 ENCOUNTER — Ambulatory Visit: Payer: Medicaid Other | Admitting: Endocrinology

## 2014-11-02 ENCOUNTER — Encounter: Payer: Self-pay | Admitting: Endocrinology

## 2014-11-02 ENCOUNTER — Ambulatory Visit (INDEPENDENT_AMBULATORY_CARE_PROVIDER_SITE_OTHER): Payer: Medicaid Other | Admitting: Endocrinology

## 2014-11-02 VITALS — BP 110/74 | HR 92 | Temp 98.6°F | Wt 259.1 lb

## 2014-11-02 DIAGNOSIS — Z23 Encounter for immunization: Secondary | ICD-10-CM

## 2014-11-02 DIAGNOSIS — E890 Postprocedural hypothyroidism: Secondary | ICD-10-CM | POA: Diagnosis not present

## 2014-11-02 MED ORDER — LEVOTHYROXINE SODIUM 137 MCG PO TABS: ORAL_TABLET | ORAL | Status: DC

## 2014-11-02 NOTE — Progress Notes (Signed)
Patient ID: Julie Carter, female   DOB: 1985/03/15, 29 y.o.   MRN: [id number]  Reason for Appointment:  Hypothyroidism, followup visit    History of Present Illness:   The hypothyroidism was first diagnosed in  2013 after her I-131 treatment for Graves' disease in 08/2011  She did need an increase in her thyroid dosage done after initial therapy was started Difficult to assess her symptoms because of her mental retardation She has difficulty with her obesity and continues to gain weight Currently the patient does not complain of unusual fatigue or cold intolerance  Her dose was not changed on her visit a year ago          Compliance with the medical regimen has been as prescribed with taking the tablet in the morning before breakfast. Not on any iron or vitamins  Her TSH is again quite normal  No visits with results within 1 Week(s) from this visit. Latest known visit with results is:  Appointment on 10/20/2014  Component Date Value Ref Range Status  . TSH 10/20/2014 1.65  0.35 - 4.50 uIU/mL Final  . Free T4 10/20/2014 1.13  0.60 - 1.60 ng/dL Final   Lab Results  Component Value Date   FREET4 1.13 10/20/2014   FREET4 1.42 10/13/2013   FREET4 1.21 10/09/2012   TSH 1.65 10/20/2014   TSH 1.16 10/13/2013   TSH 1.05 10/09/2012     Medication List       This list is accurate as of: 11/02/14  9:08 PM.  Always use your most recent med list.               glucose blood test strip  Use as instructed     levothyroxine 137 MCG tablet  Commonly known as:  SYNTHROID, LEVOTHROID  Take 1 pill every day and 2 pills on Sunday     metFORMIN 500 MG tablet  Commonly known as:  GLUCOPHAGE  Take 0.5 tablets (250 mg total) by mouth 2 (two) times daily.     Vitamin D (Ergocalciferol) 50000 UNITS Caps capsule  Commonly known as:  DRISDOL  Take 1 capsule (50,000 Units total) by mouth every 7 (seven) days.        Past Medical History  Diagnosis Date  . Cancer ([place])   .  Diabetes mellitus   . Down's syndrome   . Congenital nystagmus   . Leukemia ([place])   . Heart murmur     Past Surgical History  Procedure Laterality Date  . Hernia repair      umbilical    Family History  Problem Relation Age of Onset  . Diabetes Mother   . Hypertension Mother     Social History:  reports that she has never smoked. She has never used smokeless tobacco. She reports that she does not drink alcohol or use illicit drugs.  Allergies: No Known Allergies  REVIEW of systems:  She has has mild diabetes followed by PCP, last A1c was 7% She is however taking only 250 mg metformin twice a day Blood sugars at home are checked only fasting and they are about 130-140 She is not motivated to any exercise  She has had amenorrhea for several years of unknown etiology   Examination:   BP 110/74 mmHg  Pulse 92  Temp(Src) 98.6 F (37 C) (Oral)  Wt 259 lb 2 oz (117.538 kg)   GENERAL APPEARANCE:  has generalized obesity, no cushingoid features Does have facial hirsutism    No swelling  of the eyes Thyroid not palpable Biceps reflexes appear normal No edema      Assessments   Hypothyroidism, post ablative with  consistently  normal TSH and no clinical symptoms of hypothyroidism She has been very compliant with her thyroid supplement with the help of her mother  DIABETES: Considering her age and her A1c is relatively high at 7% and fasting readings are 130-140 without any postprandial monitoring. Discussed with the mother that since she does have obesity and insulin resistance she should be on at least 1500 mg of metformin a day instead of 500   Probable PCOS: She has amenorrhea and hirsutism and may benefit from increased doses of metformin also   Plan:   Continue same dosage of levothyroxine before breakfast daily.  Followup annually  Will forward this note to her PCP for consideration of evaluation of PCOS an increased dose of metformin  Tilda Samudio 11/02/2014,  9:08 PM   Note: This office note was prepared with Estate agent. Any transcriptional errors that result from this process are unintentional.

## 2014-11-02 NOTE — Progress Notes (Deleted)
Patient ID: Julie Carter, female   DOB: Jan 14, 1985, 29 y.o.   MRN: [id number]  Reason for Appointment:  Hypothyroidism, followup visit    History of Present Illness:   The hypothyroidism was first diagnosed in  2013 after her I-131 treatment for Graves' disease in 08/2011  She did need an increase in her thyroid dosage done after initial therapy was started Difficult to assess her symptoms because of her mental retardation  She has difficulty with her obesity  Currently the patient does not complain of unusual fatigue or cold intolerance  Her dose was not changed on her visit a year ago          Compliance with the medical regimen has been as prescribed with taking the tablet in the morning before breakfast. Not on any iron or vitamins  Her TSH is again fairly normal  Lab Results  Component Value Date   TSH 1.65 10/20/2014   TSH 1.16 10/13/2013   TSH 1.05 10/09/2012   FREET4 1.13 10/20/2014   FREET4 1.42 10/13/2013   FREET4 1.21 10/09/2012      Medication List       This list is accurate as of: 11/02/14 10:54 AM.  Always use your most recent med list.               glucose blood test strip  Use as instructed     levothyroxine 137 MCG tablet  Commonly known as:  SYNTHROID, LEVOTHROID  Take 1 pill every day and 2 pills on Sunday     metFORMIN 500 MG tablet  Commonly known as:  GLUCOPHAGE  Take 0.5 tablets (250 mg total) by mouth 2 (two) times daily.     Vitamin D (Ergocalciferol) 50000 UNITS Caps capsule  Commonly known as:  DRISDOL  Take 1 capsule (50,000 Units total) by mouth every 7 (seven) days.        Past Medical History  Diagnosis Date  . Cancer ([place])   . Diabetes mellitus   . Down's syndrome   . Congenital nystagmus   . Leukemia ([place])   . Heart murmur     Past Surgical History  Procedure Laterality Date  . Hernia repair      umbilical    Family History  Problem Relation Age of Onset  . Diabetes Mother   . Hypertension  Mother     Social History:  reports that she has never smoked. She has never used smokeless tobacco. She reports that she does not drink alcohol or use illicit drugs.  Allergies: No Known Allergies  REVIEW of systems:  She has has mild diabetes followed by PCP, apparently blood sugars are fairly good at home checked by her mother 130  Wt Readings from Last 3 Encounters:  11/02/14 259 lb 2 oz (117.538 kg)  02/02/14 265 lb 6.4 oz (120.385 kg)  10/14/13 264 lb (119.75 kg)   Has amenorrhea   Examination:   BP 110/74 mmHg  Pulse 92  Temp(Src) 98.6 F (37 C) (Oral)  Wt 259 lb 2 oz (117.538 kg)   GENERAL APPEARANCE:  has generalized obesity, she is  Communicative but not very alert    No puffiness of face or  parents             Assessments   Hypothyroidism, post ablative with now consistently  normal TSH and no clinical symptoms of hypothyroidism She has been very compliant with her thyroid supplement     Treatment:   Continue  same dosage before breakfast daily. Avoid taking any calcium or iron supplements with the thyroid supplement.   Followup annually  [facility] 11/02/2014, 10:54 AM

## 2014-11-08 ENCOUNTER — Ambulatory Visit: Payer: Medicaid Other | Admitting: Family

## 2014-11-10 ENCOUNTER — Encounter: Payer: Self-pay | Admitting: Family

## 2014-12-07 ENCOUNTER — Other Ambulatory Visit: Payer: Self-pay

## 2014-12-07 DIAGNOSIS — E119 Type 2 diabetes mellitus without complications: Secondary | ICD-10-CM

## 2014-12-07 MED ORDER — METFORMIN HCL 500 MG PO TABS: 250.0000 mg | ORAL_TABLET | Freq: Two times a day (BID) | ORAL | Status: DC

## 2014-12-07 NOTE — Telephone Encounter (Signed)
Mother aware that she will need to be seen

## 2014-12-07 NOTE — Telephone Encounter (Signed)
Pt needs to make chronic follow up

## 2015-01-11 ENCOUNTER — Ambulatory Visit (INDEPENDENT_AMBULATORY_CARE_PROVIDER_SITE_OTHER): Payer: Medicaid Other | Admitting: Family Medicine

## 2015-01-11 ENCOUNTER — Ambulatory Visit (INDEPENDENT_AMBULATORY_CARE_PROVIDER_SITE_OTHER): Payer: Medicaid Other

## 2015-01-11 ENCOUNTER — Encounter: Payer: Self-pay | Admitting: Family Medicine

## 2015-01-11 VITALS — BP 127/81 | HR 122 | Temp 99.4°F | Ht <= 58 in | Wt 261.0 lb

## 2015-01-11 DIAGNOSIS — R509 Fever, unspecified: Secondary | ICD-10-CM | POA: Diagnosis not present

## 2015-01-11 DIAGNOSIS — R0989 Other specified symptoms and signs involving the circulatory and respiratory systems: Secondary | ICD-10-CM

## 2015-01-11 DIAGNOSIS — R05 Cough: Secondary | ICD-10-CM

## 2015-01-11 DIAGNOSIS — J181 Lobar pneumonia, unspecified organism: Secondary | ICD-10-CM

## 2015-01-11 DIAGNOSIS — J029 Acute pharyngitis, unspecified: Secondary | ICD-10-CM | POA: Diagnosis not present

## 2015-01-11 DIAGNOSIS — R059 Cough, unspecified: Secondary | ICD-10-CM

## 2015-01-11 DIAGNOSIS — J189 Pneumonia, unspecified organism: Secondary | ICD-10-CM | POA: Diagnosis not present

## 2015-01-11 LAB — POCT INFLUENZA A/B
INFLUENZA A, POC: NEGATIVE
Influenza B, POC: NEGATIVE

## 2015-01-11 LAB — POCT RAPID STREP A (OFFICE): Rapid Strep A Screen: NEGATIVE

## 2015-01-11 MED ORDER — CEFTRIAXONE SODIUM 1 G IJ SOLR
1.0000 g | Freq: Once | INTRAMUSCULAR | Status: AC
  Administered 2015-01-11: 1 g via INTRAMUSCULAR

## 2015-01-11 MED ORDER — LEVOFLOXACIN 500 MG PO TABS: 500.0000 mg | ORAL_TABLET | Freq: Every day | ORAL | Status: DC

## 2015-01-11 MED ORDER — HYDROCODONE-HOMATROPINE 5-1.5 MG/5ML PO SYRP: 5.0000 mL | ORAL_SOLUTION | Freq: Four times a day (QID) | ORAL | Status: DC | PRN

## 2015-01-11 MED ORDER — LEVALBUTEROL HCL 0.63 MG/3ML IN NEBU
0.6300 mg | INHALATION_SOLUTION | Freq: Once | RESPIRATORY_TRACT | Status: AC
  Administered 2015-01-11: 0.63 mg via RESPIRATORY_TRACT

## 2015-01-11 MED ORDER — BETAMETHASONE SOD PHOS & ACET 6 (3-3) MG/ML IJ SUSP
6.0000 mg | Freq: Once | INTRAMUSCULAR | Status: AC
  Administered 2015-01-11: 6 mg via INTRAMUSCULAR

## 2015-01-11 NOTE — Progress Notes (Signed)
Subjective:  Patient ID: Julie Carter, female    DOB: [birth year]  Age: 30 y.o. MRN: [id number]  CC: No chief complaint on file.   HPI Julie Carter presents for Patient presents with upper respiratory congestion. Rhinorrhea that is frequently purulent. There is moderate sore throat. Patient reports coughing frequently as well.-colored/purulent sputum noted. There is no fever no chills no sweats. The patient has developed shortness of breath. Onset was 7 days ago. Gradually worsening in spite of home remedies.   History Julie Carter has a past medical history of Cancer (Julie Carter); Diabetes mellitus; Down's syndrome; Congenital nystagmus; Leukemia (Julie Carter); and Heart murmur.   She has past surgical history that includes Hernia repair.   Her family history includes Diabetes in her mother; Hypertension in her mother.She reports that she has never smoked. She has never used smokeless tobacco. She reports that she does not drink alcohol or use illicit drugs.  Outpatient Prescriptions Prior to Visit  Medication Sig Dispense Refill  . glucose blood test strip Use as instructed 100 each 12  . levothyroxine (SYNTHROID, LEVOTHROID) 137 MCG tablet Take 1 pill every day and 2 pills on Sunday 35 tablet 10  . metFORMIN (GLUCOPHAGE) 500 MG tablet Take 0.5 tablets (250 mg total) by mouth 2 (two) times daily. 60 tablet 0  . Vitamin D, Ergocalciferol, (DRISDOL) 50000 UNITS CAPS capsule Take 1 capsule (50,000 Units total) by mouth every 7 (seven) days. 12 capsule 3   No facility-administered medications prior to visit.    ROS Review of Systems  Constitutional: Negative for fever, chills, activity change and appetite change.  HENT: Positive for congestion, postnasal drip, rhinorrhea and sinus pressure. Negative for ear discharge, ear pain, hearing loss, nosebleeds, sneezing and trouble swallowing.   Respiratory: Positive for cough, shortness of breath and wheezing. Negative for chest tightness.   Cardiovascular:  Negative for chest pain and palpitations.  Skin: Negative for rash.    Objective:  BP 127/81 mmHg  Pulse 122  Temp(Src) 99.4 F (37.4 C) (Oral)  Ht 4' 8.5" (1.435 m)  Wt 261 lb (118.389 kg)  BMI 57.49 kg/m2  SpO2 90%  BP Readings from Last 3 Encounters:  01/11/15 127/81  11/02/14 110/74  02/02/14 127/77    Wt Readings from Last 3 Encounters:  01/11/15 261 lb (118.389 kg)  11/02/14 259 lb 2 oz (117.538 kg)  02/02/14 265 lb 6.4 oz (120.385 kg)     Physical Exam  Constitutional: She appears well-developed and well-nourished. She appears distressed.  HENT:  Head: Normocephalic and atraumatic.  Right Ear: Tympanic membrane and external ear normal. No decreased hearing is noted.  Left Ear: Tympanic membrane and external ear normal. No decreased hearing is noted.  Nose: Mucosal edema present. Right sinus exhibits no frontal sinus tenderness. Left sinus exhibits no frontal sinus tenderness.  Mouth/Throat: No oropharyngeal exudate or posterior oropharyngeal erythema.  Eyes: Pupils are equal, round, and reactive to light. Right eye exhibits no discharge. Left eye exhibits no discharge. Scleral icterus is present.  Neck: Normal range of motion. No Brudzinski's sign noted.  Cardiovascular: Normal rate and regular rhythm.   No murmur heard. Pulmonary/Chest: No respiratory distress. She has wheezes. She has rales. She exhibits no tenderness.  Lymphadenopathy:       Head (right side): No preauricular adenopathy present.       Head (left side): No preauricular adenopathy present.       Right cervical: No superficial cervical adenopathy present.      Left cervical:  No superficial cervical adenopathy present.     Lab Results  Component Value Date   WBC 6.6 04/15/2013   HGB 13.0 04/15/2013   HCT 39.4 04/15/2013   GLUCOSE 134* 02/02/2014   CHOL 162 04/15/2013   TRIG 131 04/15/2013   HDL 50 04/15/2013   LDLCALC 86 04/15/2013   ALT 62* 02/02/2014   AST 34 02/02/2014   NA 143  02/02/2014   K 4.7 02/02/2014   CL 100 02/02/2014   CREATININE 0.74 02/02/2014   BUN 9 02/02/2014   CO2 26 02/02/2014   TSH 1.65 10/20/2014   HGBA1C 7.0 02/02/2014    Nm Rai Therapy For Hyperthyroidism  08/17/2011  *RADIOLOGY REPORT* Clinical Data: Graves' disease NUCLEAR MEDICINE RADIOACTIVE IODINE THERAPY FOR HYPERTHYROIDISM Technique:  The risks and benefits of radioactive iodine therapy were discussed with the patient in detail. Alternative therapies were also mentioned. Radiation safety was discussed with the patient, including how to protect the general public from exposure. There were no barriers to communication.  Written consent was obtained.  The patient then received a capsule containing the radiopharmaceutical.  The patient will follow-up with the referring physician. Radiopharmaceutical: 24.3MILLI CURIE I-131 SODIUM IODIDE I 131 CAPSULE Comparison: Thyroid uptake 08/03/2011 Findings: The patient has laboratory values and imaging consistent Graves' disease.  The patient was accompanied by her mother as patient has Down's syndrome. IMPRESSION: I 131 therapy for Graves' disease. Original Report Authenticated By: Suzy Bouchard, M.D.   Assessment & Plan:   Diagnoses and all orders for this visit:  Cough -     POCT Influenza A/B -     DG Chest 2 View  Fever, unspecified -     POCT Influenza A/B -     POCT rapid strep A -     DG Chest 2 View  Chest congestion -     POCT Influenza A/B  Sore throat -     POCT rapid strep A  Left upper lobe pneumonia -     levofloxacin (LEVAQUIN) 500 MG tablet; Take 1 tablet (500 mg total) by mouth daily. -     HYDROcodone-homatropine (HYCODAN) 5-1.5 MG/5ML syrup; Take 5 mLs by mouth every 6 (six) hours as needed for cough. -     betamethasone acetate-betamethasone sodium phosphate (CELESTONE) injection 6 mg; Inject 1 mL (6 mg total) into the muscle once. -     cefTRIAXone (ROCEPHIN) injection 1 g; Inject 1 g into the muscle once. -      levalbuterol (XOPENEX) nebulizer solution 0.63 mg; Take 3 mLs (0.63 mg total) by nebulization once.   I am having [name] start on levofloxacin and HYDROcodone-homatropine. I am also having her maintain her glucose blood, Vitamin D (Ergocalciferol), levothyroxine, and metFORMIN. We administered betamethasone acetate-betamethasone sodium phosphate, cefTRIAXone, and levalbuterol.  Meds ordered this encounter  Medications  . levofloxacin (LEVAQUIN) 500 MG tablet    Sig: Take 1 tablet (500 mg total) by mouth daily.    Dispense:  10 tablet    Refill:  0  . HYDROcodone-homatropine (HYCODAN) 5-1.5 MG/5ML syrup    Sig: Take 5 mLs by mouth every 6 (six) hours as needed for cough.    Dispense:  120 mL    Refill:  0  . betamethasone acetate-betamethasone sodium phosphate (CELESTONE) injection 6 mg    Sig:   . cefTRIAXone (ROCEPHIN) injection 1 g    Sig:   . levalbuterol (XOPENEX) nebulizer solution 0.63 mg    Sig:    Results for orders  placed or performed in visit on 01/11/15  POCT Influenza A/B  Result Value Ref Range   Influenza A, POC Negative Negative   Influenza B, POC Negative Negative  POCT rapid strep A  Result Value Ref Range   Rapid Strep A Screen Negative Negative     Follow-up: Return in about 3 days (around 01/14/2015) for pneumonia.  Claretta Fraise, M.D.

## 2015-01-14 ENCOUNTER — Encounter (HOSPITAL_COMMUNITY): Payer: Self-pay | Admitting: Emergency Medicine

## 2015-01-14 ENCOUNTER — Ambulatory Visit (INDEPENDENT_AMBULATORY_CARE_PROVIDER_SITE_OTHER): Payer: Medicaid Other

## 2015-01-14 ENCOUNTER — Ambulatory Visit (INDEPENDENT_AMBULATORY_CARE_PROVIDER_SITE_OTHER): Payer: Medicaid Other | Admitting: Family Medicine

## 2015-01-14 ENCOUNTER — Inpatient Hospital Stay (HOSPITAL_COMMUNITY)
Admission: EM | Admit: 2015-01-14 | Discharge: 2015-01-17 | DRG: 193 | Disposition: A | Discharge status: Home / Self Care | Payer: Medicaid Other | Attending: Internal Medicine | Admitting: Internal Medicine

## 2015-01-14 ENCOUNTER — Emergency Department (HOSPITAL_COMMUNITY): Payer: Medicaid Other

## 2015-01-14 VITALS — BP 111/68 | HR 106 | Temp 102.0°F | Ht <= 58 in | Wt 259.0 lb

## 2015-01-14 DIAGNOSIS — Z6841 Body Mass Index (BMI) 40.0 and over, adult: Secondary | ICD-10-CM

## 2015-01-14 DIAGNOSIS — E890 Postprocedural hypothyroidism: Secondary | ICD-10-CM | POA: Diagnosis present

## 2015-01-14 DIAGNOSIS — Q909 Down syndrome, unspecified: Secondary | ICD-10-CM | POA: Diagnosis not present

## 2015-01-14 DIAGNOSIS — J189 Pneumonia, unspecified organism: Secondary | ICD-10-CM

## 2015-01-14 DIAGNOSIS — Z856 Personal history of leukemia: Secondary | ICD-10-CM | POA: Diagnosis not present

## 2015-01-14 DIAGNOSIS — A419 Sepsis, unspecified organism: Secondary | ICD-10-CM

## 2015-01-14 DIAGNOSIS — E119 Type 2 diabetes mellitus without complications: Secondary | ICD-10-CM

## 2015-01-14 DIAGNOSIS — J9601 Acute respiratory failure with hypoxia: Secondary | ICD-10-CM | POA: Diagnosis present

## 2015-01-14 DIAGNOSIS — E878 Other disorders of electrolyte and fluid balance, not elsewhere classified: Secondary | ICD-10-CM | POA: Diagnosis present

## 2015-01-14 DIAGNOSIS — Z833 Family history of diabetes mellitus: Secondary | ICD-10-CM

## 2015-01-14 DIAGNOSIS — E1165 Type 2 diabetes mellitus with hyperglycemia: Secondary | ICD-10-CM | POA: Diagnosis present

## 2015-01-14 DIAGNOSIS — E559 Vitamin D deficiency, unspecified: Secondary | ICD-10-CM | POA: Diagnosis present

## 2015-01-14 DIAGNOSIS — J181 Lobar pneumonia, unspecified organism: Secondary | ICD-10-CM

## 2015-01-14 DIAGNOSIS — Z7984 Long term (current) use of oral hypoglycemic drugs: Secondary | ICD-10-CM

## 2015-01-14 DIAGNOSIS — Z8249 Family history of ischemic heart disease and other diseases of the circulatory system: Secondary | ICD-10-CM

## 2015-01-14 DIAGNOSIS — Y95 Nosocomial condition: Secondary | ICD-10-CM | POA: Diagnosis present

## 2015-01-14 DIAGNOSIS — E0800 Diabetes mellitus due to underlying condition with hyperosmolarity without nonketotic hyperglycemic-hyperosmolar coma (NKHHC): Secondary | ICD-10-CM

## 2015-01-14 DIAGNOSIS — R0902 Hypoxemia: Secondary | ICD-10-CM | POA: Diagnosis not present

## 2015-01-14 LAB — URINALYSIS, ROUTINE W REFLEX MICROSCOPIC
Bilirubin Urine: NEGATIVE
Glucose, UA: 100 mg/dL — AB
Hgb urine dipstick: NEGATIVE
Ketones, ur: NEGATIVE mg/dL
Leukocytes, UA: NEGATIVE
Nitrite: NEGATIVE
PH: 5.5 (ref 5.0–8.0)
PROTEIN: NEGATIVE mg/dL

## 2015-01-14 LAB — COMPREHENSIVE METABOLIC PANEL
ALT: 41 U/L (ref 14–54)
ANION GAP: 7 (ref 5–15)
AST: 27 U/L (ref 15–41)
Albumin: 3.2 g/dL — ABNORMAL LOW (ref 3.5–5.0)
Alkaline Phosphatase: 74 U/L (ref 38–126)
BUN: 10 mg/dL (ref 6–20)
CHLORIDE: 98 mmol/L — AB (ref 101–111)
CO2: 31 mmol/L (ref 22–32)
CREATININE: 0.72 mg/dL (ref 0.44–1.00)
Calcium: 9 mg/dL (ref 8.9–10.3)
Glucose, Bld: 286 mg/dL — ABNORMAL HIGH (ref 65–99)
POTASSIUM: 3.9 mmol/L (ref 3.5–5.1)
Sodium: 136 mmol/L (ref 135–145)
Total Bilirubin: 0.9 mg/dL (ref 0.3–1.2)
Total Protein: 7.1 g/dL (ref 6.5–8.1)

## 2015-01-14 LAB — CBC WITH DIFFERENTIAL/PLATELET
Basophils Absolute: 0.1 10*3/uL (ref 0.0–0.1)
Basophils Relative: 1 %
EOS ABS: 0.1 10*3/uL (ref 0.0–0.7)
Eosinophils Relative: 1 %
HCT: 37.1 % (ref 36.0–46.0)
HEMOGLOBIN: 12.4 g/dL (ref 12.0–15.0)
LYMPHS ABS: 1.5 10*3/uL (ref 0.7–4.0)
LYMPHS PCT: 17 %
MCH: 31.8 pg (ref 26.0–34.0)
MCHC: 33.4 g/dL (ref 30.0–36.0)
MCV: 95.1 fL (ref 78.0–100.0)
Monocytes Absolute: 0.5 10*3/uL (ref 0.1–1.0)
Monocytes Relative: 6 %
NEUTROS PCT: 75 %
Neutro Abs: 6.5 10*3/uL (ref 1.7–7.7)
Platelets: 298 10*3/uL (ref 150–400)
RBC: 3.9 MIL/uL (ref 3.87–5.11)
RDW: 15.1 % (ref 11.5–15.5)
WBC: 8.6 10*3/uL (ref 4.0–10.5)

## 2015-01-14 LAB — PROTIME-INR
INR: 1.09 (ref 0.00–1.49)
PROTHROMBIN TIME: 14.3 s (ref 11.6–15.2)

## 2015-01-14 LAB — GLUCOSE, CAPILLARY: GLUCOSE-CAPILLARY: 216 mg/dL — AB (ref 65–99)

## 2015-01-14 LAB — APTT: aPTT: 29 seconds (ref 24–37)

## 2015-01-14 LAB — PROCALCITONIN: Procalcitonin: 0.35 ng/mL

## 2015-01-14 LAB — LACTIC ACID, PLASMA
LACTIC ACID, VENOUS: 1.1 mmol/L (ref 0.5–2.0)
LACTIC ACID, VENOUS: 1.1 mmol/L (ref 0.5–2.0)

## 2015-01-14 LAB — I-STAT CG4 LACTIC ACID, ED: LACTIC ACID, VENOUS: 1.04 mmol/L (ref 0.5–2.0)

## 2015-01-14 LAB — INFLUENZA PANEL BY PCR (TYPE A & B)
H1N1FLUPCR: NOT DETECTED
INFLAPCR: NEGATIVE
INFLBPCR: NEGATIVE

## 2015-01-14 LAB — BRAIN NATRIURETIC PEPTIDE: B NATRIURETIC PEPTIDE 5: 80 pg/mL (ref 0.0–100.0)

## 2015-01-14 LAB — TSH: TSH: 10.89 u[IU]/mL — AB (ref 0.350–4.500)

## 2015-01-14 MED ORDER — ENOXAPARIN SODIUM 40 MG/0.4ML ~~LOC~~ SOLN: 40.0000 mg | SUBCUTANEOUS | Status: DC

## 2015-01-14 MED ORDER — DEXTROSE 5 % IV SOLN
1.0000 g | Freq: Three times a day (TID) | INTRAVENOUS | Status: DC
  Filled 2015-01-14 (×9): qty 1

## 2015-01-14 MED ORDER — VITAMIN D (ERGOCALCIFEROL) 1.25 MG (50000 UNIT) PO CAPS
50000.0000 [IU] | ORAL_CAPSULE | ORAL | Status: DC
  Filled 2015-01-14: qty 1

## 2015-01-14 MED ORDER — IOHEXOL 350 MG/ML SOLN
100.0000 mL | Freq: Once | INTRAVENOUS | Status: AC | PRN
  Administered 2015-01-14: 100 mL via INTRAVENOUS

## 2015-01-14 MED ORDER — INSULIN ASPART 100 UNIT/ML ~~LOC~~ SOLN
0.0000 [IU] | Freq: Three times a day (TID) | SUBCUTANEOUS | Status: DC
Start: 2015-01-15 — End: 2015-01-17
  Administered 2015-01-15: 11 [IU] via SUBCUTANEOUS
  Administered 2015-01-15: 7 [IU] via SUBCUTANEOUS
  Administered 2015-01-15 – 2015-01-16 (×2): 11 [IU] via SUBCUTANEOUS
  Administered 2015-01-16 (×2): 7 [IU] via SUBCUTANEOUS
  Administered 2015-01-17: 4 [IU] via SUBCUTANEOUS
  Administered 2015-01-17: 15 [IU] via SUBCUTANEOUS

## 2015-01-14 MED ORDER — DEXTROSE 5 % IV SOLN
2.0000 g | Freq: Once | INTRAVENOUS | Status: AC
  Administered 2015-01-14: 2 g via INTRAVENOUS
  Filled 2015-01-14: qty 2

## 2015-01-14 MED ORDER — DEXTROSE 5 % IV SOLN
1.0000 g | Freq: Three times a day (TID) | INTRAVENOUS | Status: DC
  Administered 2015-01-14 – 2015-01-17 (×8): 1 g via INTRAVENOUS
  Filled 2015-01-14 (×12): qty 1

## 2015-01-14 MED ORDER — CETYLPYRIDINIUM CHLORIDE 0.05 % MT LIQD
7.0000 mL | Freq: Two times a day (BID) | OROMUCOSAL | Status: DC
  Administered 2015-01-15 – 2015-01-17 (×5): 7 mL via OROMUCOSAL

## 2015-01-14 MED ORDER — IPRATROPIUM-ALBUTEROL 0.5-2.5 (3) MG/3ML IN SOLN
3.0000 mL | RESPIRATORY_TRACT | Status: DC | PRN
  Administered 2015-01-14: 3 mL via RESPIRATORY_TRACT
  Filled 2015-01-14: qty 3

## 2015-01-14 MED ORDER — DEXTROSE 5 % IV SOLN
INTRAVENOUS | Status: AC
  Filled 2015-01-14 (×2): qty 1

## 2015-01-14 MED ORDER — HYDROCODONE-HOMATROPINE 5-1.5 MG/5ML PO SYRP
5.0000 mL | ORAL_SOLUTION | Freq: Four times a day (QID) | ORAL | Status: DC | PRN
  Administered 2015-01-14: 5 mL via ORAL
  Filled 2015-01-14: qty 5

## 2015-01-14 MED ORDER — LEVOTHYROXINE SODIUM 137 MCG PO TABS
137.0000 ug | ORAL_TABLET | Freq: Every day | ORAL | Status: DC
  Administered 2015-01-15 – 2015-01-17 (×3): 137 ug via ORAL
  Filled 2015-01-14 (×5): qty 1

## 2015-01-14 MED ORDER — ENOXAPARIN SODIUM 60 MG/0.6ML ~~LOC~~ SOLN
60.0000 mg | SUBCUTANEOUS | Status: DC
  Administered 2015-01-14 – 2015-01-16 (×3): 60 mg via SUBCUTANEOUS
  Filled 2015-01-14 (×3): qty 0.6

## 2015-01-14 MED ORDER — VANCOMYCIN HCL IN DEXTROSE 1-5 GM/200ML-% IV SOLN: 1000.0000 mg | Freq: Once | INTRAVENOUS | Status: DC

## 2015-01-14 MED ORDER — VANCOMYCIN HCL IN DEXTROSE 1-5 GM/200ML-% IV SOLN
1000.0000 mg | Freq: Once | INTRAVENOUS | Status: AC
  Administered 2015-01-14: 1000 mg via INTRAVENOUS
  Filled 2015-01-14: qty 200

## 2015-01-14 MED ORDER — SODIUM CHLORIDE 0.9 % IV SOLN: 1000.0000 mL | INTRAVENOUS | Status: DC

## 2015-01-14 MED ORDER — POTASSIUM CHLORIDE IN NACL 20-0.9 MEQ/L-% IV SOLN
INTRAVENOUS | Status: AC
Start: 2015-01-14 — End: 2015-01-15
  Administered 2015-01-14: 22:00:00 via INTRAVENOUS

## 2015-01-14 MED ORDER — SODIUM CHLORIDE 0.9 % IV BOLUS (SEPSIS)
1000.0000 mL | INTRAVENOUS | Status: DC
  Administered 2015-01-14 (×3): 1000 mL via INTRAVENOUS

## 2015-01-14 MED ORDER — VANCOMYCIN HCL IN DEXTROSE 1-5 GM/200ML-% IV SOLN
1000.0000 mg | Freq: Three times a day (TID) | INTRAVENOUS | Status: DC
  Administered 2015-01-14 – 2015-01-17 (×7): 1000 mg via INTRAVENOUS
  Filled 2015-01-14 (×7): qty 200

## 2015-01-14 NOTE — Progress Notes (Signed)
Subjective:  Patient ID: Julie Carter, female    DOB: 06/14/1985  Age: 30 y.o. MRN: [id number]  CC: Pneumonia   HPI Julie Carter presents for recheck of pneumonia. Seen 3 days ago and found to have infiltrate. Now febrile, not herself says Mom. Less active. Had rocephin & celestone injections at last visit. Taking levaquin as well. Noted to be listless by staff. Appetite poor.Dyspneic intermittently. Mom says she tends to haveproblems fighting off infections.  History Julie Carter has a past medical history of Cancer ([place]); Diabetes mellitus; Down's syndrome; Congenital nystagmus; Leukemia ([place]); and Heart murmur.   She has past surgical history that includes Hernia repair.   Her family history includes Diabetes in her mother; Hypertension in her mother.She reports that she has never smoked. She has never used smokeless tobacco. She reports that she does not drink alcohol or use illicit drugs.  No facility-administered medications prior to visit.   Outpatient Prescriptions Prior to Visit  Medication Sig Dispense Refill  . glucose blood test strip Use as instructed 100 each 12  . HYDROcodone-homatropine (HYCODAN) 5-1.5 MG/5ML syrup Take 5 mLs by mouth every 6 (six) hours as needed for cough. 120 mL 0  . levofloxacin (LEVAQUIN) 500 MG tablet Take 1 tablet (500 mg total) by mouth daily. 10 tablet 0  . levothyroxine (SYNTHROID, LEVOTHROID) 137 MCG tablet Take 1 pill every day and 2 pills on Sunday 35 tablet 10  . metFORMIN (GLUCOPHAGE) 500 MG tablet Take 0.5 tablets (250 mg total) by mouth 2 (two) times daily. 60 tablet 0  . Vitamin D, Ergocalciferol, (DRISDOL) 50000 UNITS CAPS capsule Take 1 capsule (50,000 Units total) by mouth every 7 (seven) days. 12 capsule 3    ROS Review of Systems  Constitutional: Positive for activity change (decreased - no energy) and appetite change (decreased). Negative for fever and chills.  HENT: Positive for congestion, postnasal drip, rhinorrhea and sinus  pressure. Negative for ear discharge, ear pain, hearing loss, nosebleeds, sneezing and trouble swallowing.   Respiratory: Positive for cough, shortness of breath and wheezing. Negative for chest tightness.   Cardiovascular: Negative for chest pain and palpitations.  Skin: Negative for rash.    Objective:  BP 111/68 mmHg  Pulse 106  Temp(Src) 102 F (38.9 C) (Oral)  Ht 4' 8.5" (1.435 m)  Wt 259 lb (117.482 kg)  BMI 57.05 kg/m2  SpO2 72%  BP Readings from Last 3 Encounters:  01/16/15 128/68  01/14/15 111/68  01/11/15 127/81    Wt Readings from Last 3 Encounters:  01/14/15 260 lb 14.4 oz (118.343 kg)  01/14/15 259 lb (117.482 kg)  01/11/15 261 lb (118.389 kg)   Filed Vitals:   01/14/15 1126 01/14/15 1141 01/14/15 1154  BP: 111/68    Pulse: 106    Temp: 102 F (38.9 C)    TempSrc: Oral    Height: 4' 8.5" (1.435 m)    Weight: 259 lb (117.482 kg)    SpO2: 72% 90% 72%  Room air SpO2                                                followed by 2L Julie Carter                 followed by RA after CXR  Physical Exam  Constitutional: She appears well-developed and well-nourished. She appears distressed.  HENT:  Head: Normocephalic and atraumatic.  Right Ear: Tympanic membrane and external ear normal. No decreased hearing is noted.  Left Ear: Tympanic membrane and external ear normal. No decreased hearing is noted.  Nose: Mucosal edema present. Right sinus exhibits no frontal sinus tenderness. Left sinus exhibits no frontal sinus tenderness.  Mouth/Throat: No oropharyngeal exudate or posterior oropharyngeal erythema.  Eyes: Pupils are equal, round, and reactive to light. Right eye exhibits no discharge. Left eye exhibits no discharge. Scleral icterus is present.  Neck: Normal range of motion. No Brudzinski's sign noted.  Cardiovascular: Normal rate and regular rhythm.   No murmur heard. Pulmonary/Chest: No respiratory distress. She has wheezes. She has rales. She exhibits no tenderness.    Lymphadenopathy:       Head (right side): No preauricular adenopathy present.       Head (left side): No preauricular adenopathy present.       Right cervical: No superficial cervical adenopathy present.      Left cervical: No superficial cervical adenopathy present.      Lab Results  Component Value Date   WBC 7.0 01/15/2015   HGB 11.4* 01/15/2015   HCT 34.5* 01/15/2015   PLT 302 01/15/2015   GLUCOSE 271* 01/15/2015   CHOL 162 04/15/2013   TRIG 131 04/15/2013   HDL 50 04/15/2013   LDLCALC 86 04/15/2013   ALT 41 01/14/2015   AST 27 01/14/2015   NA 139 01/15/2015   K 4.1 01/15/2015   CL 104 01/15/2015   CREATININE 0.74 01/15/2015   BUN 10 01/15/2015   CO2 28 01/15/2015   TSH 10.890* 01/14/2015   INR 1.09 01/14/2015   HGBA1C 8.5* 01/14/2015    Nm Rai Therapy For Hyperthyroidism  08/17/2011  *RADIOLOGY REPORT* Clinical Data: Graves' disease NUCLEAR MEDICINE RADIOACTIVE IODINE THERAPY FOR HYPERTHYROIDISM Technique:  The risks and benefits of radioactive iodine therapy were discussed with the patient in detail. Alternative therapies were also mentioned. Radiation safety was discussed with the patient, including how to protect the general public from exposure. There were no barriers to communication.  Written consent was obtained.  The patient then received a capsule containing the radiopharmaceutical.  The patient will follow-up with the referring physician. Radiopharmaceutical: 24.3MILLI CURIE I-131 SODIUM IODIDE I 131 CAPSULE Comparison: Thyroid uptake 08/03/2011 Findings: The patient has laboratory values and imaging consistent Graves' disease.  The patient was accompanied by her mother as patient has Down's syndrome. IMPRESSION: I 131 therapy for Graves' disease. Original Report Authenticated By: Suzy Bouchard, M.D.   Assessment & Plan:   Julie Carter was seen today for pneumonia.  Diagnoses and all orders for this visit:  LLL pneumonia -     DG Chest 2 View  CXR - compared  to 1/10 shows infiltrate unchanged at LLL  I am having Julie Carter maintain her glucose blood, Vitamin D (Ergocalciferol), levothyroxine, metFORMIN, levofloxacin, and HYDROcodone-homatropine.  No orders of the defined types were placed in this encounter.   Results for orders placed or performed in visit on 01/11/15  POCT Influenza A/B  Result Value Ref Range   Influenza A, POC Negative Negative   Influenza B, POC Negative Negative  POCT rapid strep A  Result Value Ref Range   Rapid Strep A Screen Negative Negative   Pneumonia with hypoxia.   Follow-up: EMS to [facility].  Claretta Fraise, M.D.

## 2015-01-14 NOTE — ED Provider Notes (Signed)
CSN: [id number]     Arrival date & time 01/14/15  1246 History   First MD Initiated Contact with Patient 01/14/15 1246     Chief Complaint  Patient presents with  . Low O2 Sats      (Consider location/radiation/quality/duration/timing/severity/associated sxs/prior Treatment) HPI Comments: The patient is a 30 year old female, she does have Down syndrome, she also has diabetes, Leukemia.  She presents with her mother from their doctor's office at [employer] [facility].  The mother reports that actually has been sick for the last week with a cough and fever and increased shortness of breath Xray from 10th showed LLL pna On Levaquin Xray from today showed on going pna and now is hypoxic and in resp distress Sent to ED for admission and abx Pt poor historian due to underlying MR>    The history is provided by the patient.    Past Medical History  Diagnosis Date  . Cancer ([place])   . Diabetes mellitus   . Down's syndrome   . Congenital nystagmus   . Leukemia ([place])   . Heart murmur    Past Surgical History  Procedure Laterality Date  . Hernia repair      umbilical   Family History  Problem Relation Age of Onset  . Diabetes Mother   . Hypertension Mother    Social History  Substance Use Topics  . Smoking status: Never Smoker   . Smokeless tobacco: Never Used  . Alcohol Use: No   OB History    No data available     Review of Systems  Unable to perform ROS: Acuity of condition      Allergies  Review of patient's allergies indicates no known allergies.  Home Medications   Prior to Admission medications   Medication Sig Start Date End Date Taking? Authorizing Provider  glucose blood test strip Use as instructed 10/16/12   Erby Pian, FNP  HYDROcodone-homatropine [facility]) 5-1.5 MG/5ML syrup Take 5 mLs by mouth every 6 (six) hours as needed for cough. 01/11/15   Claretta Fraise, MD  levofloxacin (LEVAQUIN) 500 MG tablet Take 1 tablet (500 mg total) by  mouth daily. 01/11/15   Claretta Fraise, MD  levothyroxine (SYNTHROID, LEVOTHROID) 137 MCG tablet Take 1 pill every day and 2 pills on Sunday 11/02/14   Elayne Snare, MD  metFORMIN (GLUCOPHAGE) 500 MG tablet Take 0.5 tablets (250 mg total) by mouth 2 (two) times daily. 12/07/14   [name], FNP  Vitamin D, Ergocalciferol, (DRISDOL) 50000 UNITS CAPS capsule Take 1 capsule (50,000 Units total) by mouth every 7 (seven) days. 02/03/14   Christy A Hawks, FNP   BP 133/70 mmHg  Pulse 100  Temp(Src) 98.6 F (37 C) (Oral)  Resp 38  Ht 4' 8.5" (1.435 m)  Wt 259 lb (117.482 kg)  BMI 57.05 kg/m2  SpO2 94% Physical Exam  Constitutional: She appears well-developed and well-nourished. She appears distressed.  HENT:  Head: Normocephalic and atraumatic.  Mouth/Throat: Oropharynx is clear and moist. No oropharyngeal exudate.  Eyes: Conjunctivae and EOM are normal. Pupils are equal, round, and reactive to light. Right eye exhibits no discharge. Left eye exhibits no discharge. No scleral icterus.  Neck: Normal range of motion. Neck supple. No JVD present. No thyromegaly present.  Cardiovascular: Regular rhythm, normal heart sounds and intact distal pulses.  Exam reveals no gallop and no friction rub.   No murmur heard. Mild tachycardia  Pulmonary/Chest: She is in respiratory distress. She has no wheezes. She  has rales.  Abdominal: Soft. Bowel sounds are normal. She exhibits no distension and no mass. There is no tenderness.  Musculoskeletal: Normal range of motion. She exhibits no edema or tenderness.  Lymphadenopathy:    She has no cervical adenopathy.  Neurological: She is alert. Coordination normal.  Skin: Skin is warm and dry. No rash noted. No erythema.  Psychiatric: She has a normal mood and affect. Her behavior is normal.  Nursing note and vitals reviewed.   ED Course  Procedures (including critical care time) Labs Review Labs Reviewed  CULTURE, BLOOD (ROUTINE X 2)  CULTURE, BLOOD (ROUTINE X  2)  URINE CULTURE  COMPREHENSIVE METABOLIC PANEL  CBC WITH DIFFERENTIAL/PLATELET  URINALYSIS, ROUTINE W REFLEX MICROSCOPIC (NOT AT [facility])  I-STAT CG4 LACTIC ACID, ED    Imaging Review Dg Chest 2 View  01/14/2015  CLINICAL DATA:  Recent diagnosis of pneumonia, patient has experience no clinical improvement. EXAM: CHEST  2 VIEW COMPARISON:  PA and lateral chest x-ray of January 11, 2015 FINDINGS: The study is limited due to scatter effects secondary to the patient's body habitus. The lungs are adequately inflated. The interstitial markings remain increased bilaterally. Confluent alveolar opacity in the lingula has become less conspicuous. The cardiac silhouette is top-normal to mildly enlarged. The central pulmonary vascularity remains prominent. IMPRESSION: 1. Mild interval improvement in alveolar infiltrate in the lingula. Persistently increased interstitial markings in both lungs are present. Additional follow-up radiographs following antibiotic therapy will be needed. 2. The cardiac silhouette appears slightly larger today. The pulmonary vascularity is slightly less distinct and appears mildly engorged. Are there other clinical or laboratory findings that might suggest low-grade CHF? This would be unusual at this age but correlation clinically is needed. Electronically Signed   By: David  Martinique M.D.   On: 01/14/2015 11:38   I have personally reviewed and evaluated these images and lab results as part of my medical decision-making.   EKG Interpretation   Date/Time:  Friday January 14 2015 12:46:38 EST Ventricular Rate:  96 PR Interval:  119 QRS Duration: 82 QT Interval:  361 QTC Calculation: 456 R Axis:   35 Text Interpretation:  Sinus rhythm Borderline short PR interval Baseline  wander in lead(s) V3 No old tracing to compare Confirmed by Toluwani Yadav  MD,  [place] ([id number]) on 01/14/2015 1:10:35 PM      MDM   Final diagnoses:  None    Increased WOB,  consider worsening pna (though  clearing on xray) PE less likely but possible CHF also possible but less likely ECG without ischemic fidnings. Labs pending Sat's 80% on ra with significant tachypnea. The pt required tx for pulmonary sepsis with the following:  1.  Cardiac monitoring 2.  Blood Pressure monitoring 3.  Fluid Rescucitation for hypotension 30cc/Kg NS 4.  Broad spectrum abx for failed outpt tx 5.  CT angio to r/o PE - (negative)  At change of shift - care signed over to Dr. Jeneen Rinks to f/u CT report, reevaluated and disposition accordingly - anticipated disposition is admissino.    Noemi Chapel, MD 01/15/15 [id number]

## 2015-01-14 NOTE — ED Notes (Signed)
MD at bedside. 

## 2015-01-14 NOTE — ED Notes (Signed)
Dr Sabra Heck given VS BP 115/72, Sats 98% on 2L/M, HR 91 and Resp 27.  Attempting to lower [facility] for tolerance for possible CTA.

## 2015-01-14 NOTE — H&P (Signed)
[facility] History and Physical  Julie Carter [id number] DOB: 1985-08-10 DOA: 01/14/2015  Referring physician: ED physician PCP: Evelina Dun, FNP  Specialists:   Chief Complaint: Hypoxia at [place] office   HPI: Julie Carter is a 30 y.o. female with PMH of trisomy 21, leukemia, and diabetes mellitus who presents to the ED at the direction of her PCP after O2 sat in the doctor's office was noted to be in the low 70s. Julie Carter developed respiratory distress several days ago with nonproductive cough and was evaluated in her PCPs office where chest x-ray was obtained, demonstrating a left upper lobe pneumonia, and the patient was started on Levaquin after receiving an IM dose of Rocephin in the office. With symptoms continuing to worsen, patient return to her PCP today Where her O2 sat was noted to be 72% on room air. Supplemental oxygen was administered in the outpatient setting and EMS was activated for transport to the hospital.  In ED, patient was found to be hypoxic with sats in the 70s, recovering with 3 L/m supplemental oxygen. She was febrile to 38.9 C with tachycardia in the low 100s and tachypnea in the low 30s. She received IV fluids in the ED, blood cultures were obtained, and empiric vancomycin and cefepime were administered. CT angiogram of the chest was obtained from the ED, demonstrated a bilateral airspace process concerning for multifocal pneumonia. Initial blood work demonstrates hyperglycemia to 286, a mild hypochloremia, and a lactic acid of 1.1. Patient remained stable in the emergency department and was admitted to the hospital for ongoing evaluation and management.   Where does patient live?     ALF     Can patient participate in ADLs?  Some   Review of Systems:   Unable to obtain secondary to clinical condition with chronic cognitive deficits.    Allergy: No Known Allergies  Past Medical History  Diagnosis Date  . Cancer ([place])   . Diabetes mellitus    . Down's syndrome   . Congenital nystagmus   . Leukemia ([place])   . Heart murmur     Past Surgical History  Procedure Laterality Date  . Hernia repair      umbilical    Social History:  reports that she has never smoked. She has never used smokeless tobacco. She reports that she does not drink alcohol or use illicit drugs.  Family History:  Family History  Problem Relation Age of Onset  . Diabetes Mother   . Hypertension Mother      Prior to Admission medications   Medication Sig Start Date End Date Taking? Authorizing Provider  glucose blood test strip Use as instructed 10/16/12  Yes Mae Loree Fee, FNP  HYDROcodone-homatropine [facility]) 5-1.5 MG/5ML syrup Take 5 mLs by mouth every 6 (six) hours as needed for cough. 01/11/15  Yes Claretta Fraise, MD  levofloxacin (LEVAQUIN) 500 MG tablet Take 1 tablet (500 mg total) by mouth daily. 01/11/15  Yes Claretta Fraise, MD  levothyroxine (SYNTHROID, LEVOTHROID) 137 MCG tablet Take 1 pill every day and 2 pills on Sunday 11/02/14  Yes Elayne Snare, MD  metFORMIN (GLUCOPHAGE) 500 MG tablet Take 0.5 tablets (250 mg total) by mouth 2 (two) times daily. 12/07/14  Yes [name], FNP  Vitamin D, Ergocalciferol, (DRISDOL) 50000 UNITS CAPS capsule Take 1 capsule (50,000 Units total) by mouth every 7 (seven) days. 02/03/14  Yes [name], FNP    Physical Exam: Filed Vitals:   01/14/15 1400 01/14/15 1430  01/14/15 1500 01/14/15 1739  BP: 115/72 103/67 116/79 130/83  Pulse: 95 86 88 91  Temp:      TempSrc:      Resp: 38 31 25 20   Height:      Weight:      SpO2: 94% 93% 97% 97%   General:   Down facies. Very obese. Mild respiratory distress with accessory muscle recruitment, no tachypnea, pallor, or cyanosis  HEENT:       Eyes: PERRL, EOMI, no scleral icterus or conjunctival pallor. Horizontal strabismus         ENT: No discharge from the ears or nose, no pharyngeal ulcers, petechiae or exudate         Neck: No JVD, no bruit, no  appreciable mass Heme: No cervical adenopathy, no pallor Cardiac: S1/S2, RRR, grade II midsystolic murmur, No gallops or rubs. Pulm: Good air movement bilaterally. Coarse ronchi over left lung fields > rt, no wheeze or rubs. Abd: Soft, nondistended, nontender, no rebound pain or gaurding, BS present. Ext: No LE edema bilaterally. 2+DP/PT pulse bilaterally. Musculoskeletal: No red, hot, swollen joints, no limitation in ROM  Skin: No rashes or wounds on exposed surfaces  Neuro: Alert, oriented to person and place, no facial asymmetry, moves all extremities. No focal findings Psych: Patient is not overtly psychotic, appropriate mood and affect.  Labs on Admission:  Basic Metabolic Panel:  Recent Labs Lab 01/14/15 1300  NA 136  K 3.9  CL 98*  CO2 31  GLUCOSE 286*  BUN 10  CREATININE 0.72  CALCIUM 9.0   Liver Function Tests:  Recent Labs Lab 01/14/15 1300  AST 27  ALT 41  ALKPHOS 74  BILITOT 0.9  PROT 7.1  ALBUMIN 3.2*   No results for input(s): LIPASE, AMYLASE in the last 168 hours. No results for input(s): AMMONIA in the last 168 hours. CBC:  Recent Labs Lab 01/14/15 1300  WBC 8.6  NEUTROABS 6.5  HGB 12.4  HCT 37.1  MCV 95.1  PLT 298   Cardiac Enzymes: No results for input(s): CKTOTAL, CKMB, CKMBINDEX, TROPONINI in the last 168 hours.  BNP (last 3 results)  Recent Labs  01/14/15 1315  BNP 80.0    ProBNP (last 3 results) No results for input(s): PROBNP in the last 8760 hours.  CBG: No results for input(s): GLUCAP in the last 168 hours.  Radiological Exams on Admission: Dg Chest 2 View  01/14/2015  CLINICAL DATA:  Recent diagnosis of pneumonia, patient has experience no clinical improvement. EXAM: CHEST  2 VIEW COMPARISON:  PA and lateral chest x-ray of January 11, 2015 FINDINGS: The study is limited due to scatter effects secondary to the patient's body habitus. The lungs are adequately inflated. The interstitial markings remain increased  bilaterally. Confluent alveolar opacity in the lingula has become less conspicuous. The cardiac silhouette is top-normal to mildly enlarged. The central pulmonary vascularity remains prominent. IMPRESSION: 1. Mild interval improvement in alveolar infiltrate in the lingula. Persistently increased interstitial markings in both lungs are present. Additional follow-up radiographs following antibiotic therapy will be needed. 2. The cardiac silhouette appears slightly larger today. The pulmonary vascularity is slightly less distinct and appears mildly engorged. Are there other clinical or laboratory findings that might suggest low-grade CHF? This would be unusual at this age but correlation clinically is needed. Electronically Signed   By: David  Martinique M.D.   On: 01/14/2015 11:38   Ct Angio Chest Pe W/cm &/or Wo Cm  01/14/2015  CLINICAL DATA:  Shortness  of breath and hypoxia. History of Down's syndrome, diabetes and anemia. EXAM: CT ANGIOGRAPHY CHEST WITH CONTRAST TECHNIQUE: Multidetector CT imaging of the chest was performed using the standard protocol during bolus administration of intravenous contrast. Multiplanar CT image reconstructions and MIPs were obtained to evaluate the vascular anatomy. CONTRAST:  169mL OMNIPAQUE IOHEXOL 350 MG/ML SOLN COMPARISON:  Chest x-ray 01/14/2015 and CT abdomen 01/07/2004 FINDINGS: Lungs are adequately inflated and demonstrate patchy bilateral airspace opacification suggesting multifocal pneumonia. No evidence effusion. Airways are within normal. There is mild cardiomegaly. Pulmonary arterial system is normal without evidence of emboli. 1.1 cm and 1.3 cm lymph nodes in the right peritracheal region. 1 cm AP window lymph node. These lymph nodes are likely reactive. No evidence of hilar or axillary adenopathy. Remaining mediastinal structures are within normal. Images through the upper abdomen demonstrate diffuse decreased density of the liver. There are moderate overlying soft  tissues present. Minimal degenerate change of the spine. Review of the MIP images confirms the above findings. IMPRESSION: Patchy bilateral airspace process likely multifocal pneumonia. Mild mediastinal adenopathy likely reactive. No evidence of pulmonary embolism. Electronically Signed   By: Marin Olp M.D.   On: 01/14/2015 16:53    EKG:   Not done in ED, will obtain as appropriate   Assessment/Plan  1. Sepsis d/t PNA, organsim unknown - Treated empirically with Levaquin outpt (1/10 - 1/13); IM Rocephin given 1/10  - CTA c/w multifocal PNA on admission  - Given systemic involvement and worsening on Levaquin, will cover broadly now and deescalate as appropriate considering culture data, procalcitonin, and clinical progress  - Vancomycin and cefepime with pharmacy consultation  - Blood cultures obtained, though pt had been on abx outpt  - Trend lactate, check procalcitonin  - Check urine for strep pneumo antigens; seasonal flu PCR  - Monitor with continuous pulse oximetry  - Trial neb treatments   2. Type II DM  - Serum glucose 286 on presentation, perhaps elevated in setting of sepsis - Hold metformin while inpt  - Check CBGs with meals and qHS  - SSI correctional regimen, adjusted according to CBGs  - A1c pending; was 7.0% on 02/02/14, suggesting adequate control  - Carb-modified diet    3. Hypothyroidism  - Continue home-dose Synthroid  - Check TSH    4. Vit D deficiency  - Will continue supplementing   -   5. Hypochloremia  - Suspect secondary to dehydration in setting of sepsis  - Anticipate resolution with IVF  - Repeat chem panel in am    DVT ppx:  SQ Lovenox     Code Status: Full code Family Communication:   Yes, patient's sister at bed side Disposition Plan: Admit to inpatient   Date of Service 01/14/2015    Vianne Bulls, MD [facility] Pager [phone]  If 7PM-7AM, please contact night-coverage [email or web address] Password [facility] 01/14/2015, 5:49  PM

## 2015-01-14 NOTE — Progress Notes (Signed)
ANTIBIOTIC CONSULT NOTE - INITIAL  Pharmacy Consult for Vancomycin and Cefepime 1/13 >> Indication: pneumonia  No Known Allergies  Patient Measurements: Height: 4' 8.5" (143.5 cm) Weight: 259 lb (117.482 kg) IBW/kg (Calculated) : 37.45  Vital Signs: Temp: 98.6 F (37 C) (01/13 1247) Temp Source: Oral (01/13 1247) BP: 103/67 mmHg (01/13 1430) Pulse Rate: 86 (01/13 1430) Intake/Output from previous day:   Intake/Output from this shift: Total I/O In: 1250 [I.V.:1050; Other:200] Out: -   Labs:  Recent Labs  01/14/15 1300  WBC 8.6  HGB 12.4  PLT 298  CREATININE 0.72   Estimated Creatinine Clearance: 112.8 mL/min (by C-G formula based on Cr of 0.72). No results for input(s): VANCOTROUGH, VANCOPEAK, VANCORANDOM, GENTTROUGH, GENTPEAK, GENTRANDOM, TOBRATROUGH, TOBRAPEAK, TOBRARND, AMIKACINPEAK, AMIKACINTROU, AMIKACIN in the last 72 hours.   Microbiology: Recent Results (from the past 720 hour(s))  Blood Culture (routine x 2)     Status: None (Preliminary result)   Collection Time: 01/14/15  1:02 PM  Result Value Ref Range Status   Specimen Description BLOOD DRAWN BY IV THERAPY  Final   Special Requests BOTTLES DRAWN AEROBIC AND ANAEROBIC 6 CC EACH  Final   Culture PENDING  Incomplete   Report Status PENDING  Incomplete  Blood Culture (routine x 2)     Status: None (Preliminary result)   Collection Time: 01/14/15  1:07 PM  Result Value Ref Range Status   Specimen Description BLOOD DRAWN BY IV THERAPY  Final   Special Requests BOTTLES DRAWN AEROBIC AND ANAEROBIC 8 CC EACH  Final   Culture PENDING  Incomplete   Report Status PENDING  Incomplete    Medical History: Past Medical History  Diagnosis Date  . Cancer ([place])   . Diabetes mellitus   . Down's syndrome   . Congenital nystagmus   . Leukemia ([place])   . Heart murmur    Anti-infectives    Start     Dose/Rate Route Frequency Ordered Stop   01/14/15 1500  vancomycin (VANCOCIN) IVPB 1000 mg/200 mL premix     Comments:  * Give 2 bags of 1000mg  each to make total dose of 2000mg  today *   1,000 mg 200 mL/hr over 60 Minutes Intravenous  Once 01/14/15 1316     01/14/15 1400  vancomycin (VANCOCIN) IVPB 1000 mg/200 mL premix     1,000 mg 200 mL/hr over 60 Minutes Intravenous  Once 01/14/15 1316 01/14/15 1426   01/14/15 1300  ceFEPIme (MAXIPIME) 2 g in dextrose 5 % 50 mL IVPB     2 g 100 mL/hr over 30 Minutes Intravenous  Once 01/14/15 1256 01/14/15 1402   01/14/15 1300  vancomycin (VANCOCIN) IVPB 1000 mg/200 mL premix  Status:  Discontinued     1,000 mg 200 mL/hr over 60 Minutes Intravenous  Once 01/14/15 1256 01/14/15 1311     Assessment: 30yo obese female.  Asked to initiate Vancomycin and Cefepime for pna.  Pt has good renal fxn.  Goal of Therapy:  Vancomycin trough level 15-20 mcg/ml  Plan:  Cefepime 2gm loading dose IV x 1 then 1gm IV q8h (signed and held) Vancomycin 2gm loading dose IV x 1 then 1gm IV q8h (signed and held) Check trough level at steady state Monitor labs, renal fxn, progress and c/s  Hart Robinsons A 01/14/2015,2:47 PM

## 2015-01-14 NOTE — ED Notes (Signed)
Pt was a doctors appointment and oxygen Sats were 72% on RA and pt was placed on 2l/m [place] in office and EMs called to  Transport to ED.  Sats on RA was 87% and placed on O2@2L /MSats remained 90-91%.  Increased O2to 3L/M and Sats currently 95%.

## 2015-01-14 NOTE — ED Provider Notes (Signed)
Care assumed from Dr. Sabra Heck. Patient discussed. CT scan shows left lower lobe pneumonia. No pulmonary embolus or mass. Reactive mediastinal lymphadenopathy. Patient saturating in the mid to high 90s on 2 L nasal cannula. Has been given antibiotics of vancomycin and Maxipime. Blood cultures obtained prior to this. Care discussed with Dr.Opyd, [facility]. To be admitted under his care.  Tanna Furry, MD 01/14/15 1726

## 2015-01-14 NOTE — ED Notes (Signed)
Verified Vancomycin with Lauren Pharmacist

## 2015-01-15 DIAGNOSIS — J189 Pneumonia, unspecified organism: Principal | ICD-10-CM

## 2015-01-15 DIAGNOSIS — J9601 Acute respiratory failure with hypoxia: Secondary | ICD-10-CM

## 2015-01-15 DIAGNOSIS — E890 Postprocedural hypothyroidism: Secondary | ICD-10-CM

## 2015-01-15 LAB — GLUCOSE, CAPILLARY
GLUCOSE-CAPILLARY: 242 mg/dL — AB (ref 65–99)
Glucose-Capillary: 254 mg/dL — ABNORMAL HIGH (ref 65–99)
Glucose-Capillary: 293 mg/dL — ABNORMAL HIGH (ref 65–99)
Glucose-Capillary: 208 mg/dL — ABNORMAL HIGH (ref 65–99)

## 2015-01-15 LAB — BASIC METABOLIC PANEL
ANION GAP: 7 (ref 5–15)
BUN: 10 mg/dL (ref 6–20)
CHLORIDE: 104 mmol/L (ref 101–111)
CO2: 28 mmol/L (ref 22–32)
Calcium: 8.1 mg/dL — ABNORMAL LOW (ref 8.9–10.3)
Creatinine, Ser: 0.74 mg/dL (ref 0.44–1.00)
GFR calc Af Amer: >60 mL/min (ref >60)
GFR calc non Af Amer: >60 mL/min (ref >60)
Glucose, Bld: 271 mg/dL — ABNORMAL HIGH (ref 65–99)
POTASSIUM: 4.1 mmol/L (ref 3.5–5.1)
Sodium: 139 mmol/L (ref 135–145)

## 2015-01-15 LAB — CBC WITH DIFFERENTIAL/PLATELET
Basophils Absolute: 0.1 10*3/uL (ref 0.0–0.1)
Basophils Relative: 1 %
EOS ABS: 0.1 10*3/uL (ref 0.0–0.7)
Eosinophils Relative: 1 %
HEMATOCRIT: 34.5 % — AB (ref 36.0–46.0)
HEMOGLOBIN: 11.4 g/dL — AB (ref 12.0–15.0)
LYMPHS ABS: 1.6 10*3/uL (ref 0.7–4.0)
LYMPHS PCT: 23 %
MCH: 32 pg (ref 26.0–34.0)
MCHC: 33 g/dL (ref 30.0–36.0)
MCV: 96.9 fL (ref 78.0–100.0)
MONOS PCT: 6 %
Monocytes Absolute: 0.4 10*3/uL (ref 0.1–1.0)
NEUTROS ABS: 4.8 10*3/uL (ref 1.7–7.7)
NEUTROS PCT: 69 %
Platelets: 302 10*3/uL (ref 150–400)
RBC: 3.56 MIL/uL — AB (ref 3.87–5.11)
RDW: 15.3 % (ref 11.5–15.5)
WBC: 7 10*3/uL (ref 4.0–10.5)

## 2015-01-15 LAB — HIV ANTIBODY (ROUTINE TESTING W REFLEX): HIV SCREEN 4TH GENERATION: NONREACTIVE

## 2015-01-15 LAB — HEMOGLOBIN A1C
HEMOGLOBIN A1C: 8.5 % — AB (ref 4.8–5.6)
Mean Plasma Glucose: 197 mg/dL

## 2015-01-15 LAB — STREP PNEUMONIAE URINARY ANTIGEN: STREP PNEUMO URINARY ANTIGEN: NEGATIVE

## 2015-01-15 MED ORDER — VITAMIN D (ERGOCALCIFEROL) 1.25 MG (50000 UNIT) PO CAPS: 50000.0000 [IU] | ORAL_CAPSULE | ORAL | Status: DC

## 2015-01-15 MED ORDER — INSULIN GLARGINE 100 UNIT/ML ~~LOC~~ SOLN
5.0000 [IU] | Freq: Every day | SUBCUTANEOUS | Status: DC
Start: 2015-01-15 — End: 2015-01-17
  Administered 2015-01-15 – 2015-01-17 (×3): 5 [IU] via SUBCUTANEOUS
  Filled 2015-01-15 (×4): qty 0.05

## 2015-01-15 MED ORDER — SODIUM CHLORIDE 0.9 % IV SOLN
INTRAVENOUS | Status: DC
  Administered 2015-01-15 – 2015-01-16 (×2): via INTRAVENOUS

## 2015-01-15 NOTE — Progress Notes (Signed)
PROGRESS NOTE  Julie Carter [id number] DOB: [birth year] DOA:  01/14/2015 PCP: Evelina Dun, FNP  HPI/Recap of past 24 hours:  patient is a 30 year old with past medical historyfemale with past medical history of trisomy 21, obesity and diabetes mellitus who had been treated for several days for a pneumonia as outpatient, but then sent over to the emergency room on 1/13 after her PCP noted that she was hypoxic in the office. In the emergency room, patient noted to have normal white blood cell count, but tachycardic, febrile, tachypneic and oxygen saturations in the 70s on room air. Patient started on IV antibiotics, oxygen  This morning, patient doing better. If she is in saturations above 90% on 2 L. She is comfortable without complaints  Assessment/Plan: Principal Problem:   Acute respiratory failure with hypoxia secondary to HCAP (healthcare-associated pneumonia): Continue antibiotics, oxygen supplementation Active Problems:   Hypothyroidism, postradioiodine therapy : Continue Synthroid   Diabetes mellitus ([place]) with acute hyperglycemia: A1c 8.5, worse from 1 year ago. Likely more elevated in the setting of acute illness. Sliding-scale and low-dose Lantus    Hypochloremia Morbid obesity: Patient meets criteria with BMI greater than 40 History of trisomy 21  Code Status: Full code   Family Communication: Mother at the bedside   Disposition Plan: Likely here for several days    Consultants:  None   Procedures:  None   Antibiotics:   Levaquin prior to admission 4 days  IV vancomycin 1/13-present  IV cefepime 1/13-present   Objective: BP 121/67 mmHg  Pulse 87  Temp(Src) 98.1 F (36.7 C) (Oral)  Resp 20  Ht 4' 8.5" (1.435 m)  Wt 118.343 kg (260 lb 14.4 oz)  BMI 57.47 kg/m2  SpO2 94%  Intake/Output Summary (Last 24 hours) at 01/15/15 1520 Last data filed at 01/15/15 1200  Gross per 24 hour  Intake 2496.67 ml  Output    900 ml  Net 1596.67 ml   Filed  Weights   01/14/15 1247 01/14/15 2007  Weight: 117.482 kg (259 lb) 118.343 kg (260 lb 14.4 oz)    Exam:   General:  Alert and oriented 2, no acute distress   Cardiovascular: Regular rate and rhythm, S1-S2   Respiratory: Decreased breath sounds throughout secondary to body habitus, decreased at bases   Abdomen: Soft, obese, nontender, positive bowel sounds   Musculoskeletal: Trace pitting edema bilaterally    Data Reviewed: Basic Metabolic Panel:  Recent Labs Lab 01/14/15 1300 01/15/15 0625  NA 136 139  K 3.9 4.1  CL 98* 104  CO2 31 28  GLUCOSE 286* 271*  BUN 10 10  CREATININE 0.72 0.74  CALCIUM 9.0 8.1*   Liver Function Tests:  Recent Labs Lab 01/14/15 1300  AST 27  ALT 41  ALKPHOS 74  BILITOT 0.9  PROT 7.1  ALBUMIN 3.2*   No results for input(s): LIPASE, AMYLASE in the last 168 hours. No results for input(s): AMMONIA in the last 168 hours. CBC:  Recent Labs Lab 01/14/15 1300 01/15/15 0625  WBC 8.6 7.0  NEUTROABS 6.5 4.8  HGB 12.4 11.4*  HCT 37.1 34.5*  MCV 95.1 96.9  PLT 298 302   Cardiac Enzymes:   No results for input(s): CKTOTAL, CKMB, CKMBINDEX, TROPONINI in the last 168 hours. BNP (last 3 results)  Recent Labs  01/14/15 1315  BNP 80.0    ProBNP (last 3 results) No results for input(s): PROBNP in the last 8760 hours.  CBG:  Recent Labs Lab 01/14/15 2208 01/15/15 0802  01/15/15 1145  GLUCAP 216* 242* 254*    Recent Results (from the past 240 hour(s))  Blood Culture (routine x 2)     Status: None (Preliminary result)   Collection Time: 01/14/15  1:02 PM  Result Value Ref Range Status   Specimen Description BLOOD DRAWN BY IV THERAPY  Final   Special Requests BOTTLES DRAWN AEROBIC AND ANAEROBIC 6 CC EACH  Final   Culture NO GROWTH < 24 HOURS  Final   Report Status PENDING  Incomplete  Blood Culture (routine x 2)     Status: None (Preliminary result)   Collection Time: 01/14/15  1:07 PM  Result Value Ref Range Status    Specimen Description BLOOD DRAWN BY IV THERAPY  Final   Special Requests BOTTLES DRAWN AEROBIC AND ANAEROBIC 8 CC EACH  Final   Culture NO GROWTH < 24 HOURS  Final   Report Status PENDING  Incomplete  Urine culture     Status: None (Preliminary result)   Collection Time: 01/14/15  1:55 PM  Result Value Ref Range Status   Specimen Description URINE, CLEAN CATCH  Final   Special Requests NONE  Final   Culture   Final    TOO YOUNG TO READ Performed at [facility]    Report Status PENDING  Incomplete     Studies: Ct Angio Chest Pe W/cm &/or Wo Cm  01/14/2015  CLINICAL DATA:  Shortness of breath and hypoxia. History of Down's syndrome, diabetes and anemia. EXAM: CT ANGIOGRAPHY CHEST WITH CONTRAST TECHNIQUE: Multidetector CT imaging of the chest was performed using the standard protocol during bolus administration of intravenous contrast. Multiplanar CT image reconstructions and MIPs were obtained to evaluate the vascular anatomy. CONTRAST:  137mL OMNIPAQUE IOHEXOL 350 MG/ML SOLN COMPARISON:  Chest x-ray 01/14/2015 and CT abdomen 01/07/2004 FINDINGS: Lungs are adequately inflated and demonstrate patchy bilateral airspace opacification suggesting multifocal pneumonia. No evidence effusion. Airways are within normal. There is mild cardiomegaly. Pulmonary arterial system is normal without evidence of emboli. 1.1 cm and 1.3 cm lymph nodes in the right peritracheal region. 1 cm AP window lymph node. These lymph nodes are likely reactive. No evidence of hilar or axillary adenopathy. Remaining mediastinal structures are within normal. Images through the upper abdomen demonstrate diffuse decreased density of the liver. There are moderate overlying soft tissues present. Minimal degenerate change of the spine. Review of the MIP images confirms the above findings. IMPRESSION: Patchy bilateral airspace process likely multifocal pneumonia. Mild mediastinal adenopathy likely reactive. No evidence of pulmonary  embolism. Electronically Signed   By: Marin Olp M.D.   On: 01/14/2015 16:53    Scheduled Meds: . antiseptic oral rinse  7 mL Mouth Rinse BID  . ceFEPime (MAXIPIME) IV  1 g Intravenous Q8H  . enoxaparin (LOVENOX) injection  60 mg Subcutaneous Q24H  . insulin aspart  0-20 Units Subcutaneous TID WC  . insulin glargine  5 Units Subcutaneous Daily  . levothyroxine  137 mcg Oral QAC breakfast  . vancomycin  1,000 mg Intravenous Q8H  . [START ON 01/27/2015] Vitamin D (Ergocalciferol)  50,000 Units Oral Q7 days    Continuous Infusions: . sodium chloride 125 mL/hr at 01/15/15 1010     Time spent: 15 minutes   [facility] Pager [phone] . If 7PM-7AM, please contact night-coverage at [email or web address], password [facility] 01/15/2015, 3:20 PM  LOS: 1 day

## 2015-01-15 NOTE — Progress Notes (Signed)
ANTIBIOTIC CONSULT NOTE - follow up  Pharmacy Consult for Vancomycin and Cefepime 1/13 >> Indication: pneumonia  No Known Allergies  Patient Measurements: Height: 4' 8.5" (143.5 cm) Weight: 260 lb 14.4 oz (118.343 kg) IBW/kg (Calculated) : 37.45  Vital Signs: Temp: 98.3 F (36.8 C) (01/14 0606) Temp Source: Oral (01/14 0606) BP: 116/66 mmHg (01/14 0606) Pulse Rate: 95 (01/14 0606) Intake/Output from previous day: 01/13 0701 - 01/14 0700 In: 3266.7 [I.V.:2816.7] Out: 400 [Urine:400] Intake/Output from this shift:    Labs:  Recent Labs  01/14/15 1300 01/15/15 0625  WBC 8.6 7.0  HGB 12.4 11.4*  PLT 298 302  CREATININE 0.72 0.74   Estimated Creatinine Clearance: 113.3 mL/min (by C-G formula based on Cr of 0.74). No results for input(s): VANCOTROUGH, VANCOPEAK, VANCORANDOM, GENTTROUGH, GENTPEAK, GENTRANDOM, TOBRATROUGH, TOBRAPEAK, TOBRARND, AMIKACINPEAK, AMIKACINTROU, AMIKACIN in the last 72 hours.   Microbiology: Recent Results (from the past 720 hour(s))  Blood Culture (routine x 2)     Status: None (Preliminary result)   Collection Time: 01/14/15  1:02 PM  Result Value Ref Range Status   Specimen Description BLOOD DRAWN BY IV THERAPY  Final   Special Requests BOTTLES DRAWN AEROBIC AND ANAEROBIC 6 CC EACH  Final   Culture NO GROWTH < 24 HOURS  Final   Report Status PENDING  Incomplete  Blood Culture (routine x 2)     Status: None (Preliminary result)   Collection Time: 01/14/15  1:07 PM  Result Value Ref Range Status   Specimen Description BLOOD DRAWN BY IV THERAPY  Final   Special Requests BOTTLES DRAWN AEROBIC AND ANAEROBIC 8 CC EACH  Final   Culture NO GROWTH < 24 HOURS  Final   Report Status PENDING  Incomplete    Medical History: Past Medical History  Diagnosis Date  . Cancer ([place])   . Diabetes mellitus   . Down's syndrome   . Congenital nystagmus   . Leukemia ([place])   . Heart murmur    Anti-infectives    Start     Dose/Rate Route Frequency Ordered  Stop   01/14/15 2200  vancomycin (VANCOCIN) IVPB 1000 mg/200 mL premix     1,000 mg 200 mL/hr over 60 Minutes Intravenous Every 8 hours 01/14/15 2055     01/14/15 2200  ceFEPIme (MAXIPIME) 1 g in dextrose 5 % 50 mL IVPB  Status:  Discontinued     1 g 100 mL/hr over 30 Minutes Intravenous 3 times per day 01/14/15 1748 01/14/15 2109   01/14/15 2100  ceFEPIme (MAXIPIME) 1 g in dextrose 5 % 50 mL IVPB     1 g 100 mL/hr over 30 Minutes Intravenous Every 8 hours 01/14/15 2055     01/14/15 1500  vancomycin (VANCOCIN) IVPB 1000 mg/200 mL premix    Comments:  * Give 2 bags of 1000mg  each to make total dose of 2000mg  today *   1,000 mg 200 mL/hr over 60 Minutes Intravenous  Once 01/14/15 1316 01/14/15 1521   01/14/15 1400  vancomycin (VANCOCIN) IVPB 1000 mg/200 mL premix     1,000 mg 200 mL/hr over 60 Minutes Intravenous  Once 01/14/15 1316 01/14/15 1426   01/14/15 1300  ceFEPIme (MAXIPIME) 2 g in dextrose 5 % 50 mL IVPB     2 g 100 mL/hr over 30 Minutes Intravenous  Once 01/14/15 1256 01/14/15 1402   01/14/15 1300  vancomycin (VANCOCIN) IVPB 1000 mg/200 mL premix  Status:  Discontinued     1,000 mg 200 mL/hr over 60  Minutes Intravenous  Once 01/14/15 1256 01/14/15 1311     Assessment: 30yo obese female.  Asked to initiate Vancomycin and Cefepime for pna.  Pt has good renal fxn.  Goal of Therapy:  Vancomycin trough level 15-20 mcg/ml  Plan:  Cefepime 1 then 1gm IV q8h  Vancomycin 1 then 1gm IV q8h  Check trough level at steady state Monitor labs, renal fxn, progress and c/s  Hart Robinsons A 01/15/2015,9:28 AM

## 2015-01-16 DIAGNOSIS — Q909 Down syndrome, unspecified: Secondary | ICD-10-CM

## 2015-01-16 LAB — GLUCOSE, CAPILLARY
GLUCOSE-CAPILLARY: 205 mg/dL — AB (ref 65–99)
Glucose-Capillary: 217 mg/dL — ABNORMAL HIGH (ref 65–99)
Glucose-Capillary: 290 mg/dL — ABNORMAL HIGH (ref 65–99)
Glucose-Capillary: 236 mg/dL — ABNORMAL HIGH (ref 65–99)

## 2015-01-16 LAB — URINE CULTURE

## 2015-01-16 NOTE — Progress Notes (Signed)
Patient's O2 sats at rest on RA 93%. Patient ambulated hallway on RA and sats dropped to 86% on RA.  O2 at 2L [place] applied and sats returned to 92%.   Patient ambulated back to room.  O2 sats at rest on 2L [place] 94%.  Patient resting in chair.  Denies shortness of breath.

## 2015-01-16 NOTE — Progress Notes (Signed)
PROGRESS NOTE  Julie Carter [id number] DOB: November 16, 1985 DOA:  01/14/2015 PCP: Evelina Dun, FNP  HPI/Recap of past 24 hours:  patient is a 30 year old with past medical historyfemale with past medical history of trisomy 21, obesity and diabetes mellitus who had been treated for several days for a pneumonia as outpatient, but then sent over to the emergency room on 1/13 after her PCP noted that she was hypoxic in the office. In the emergency room, patient noted to have normal white blood cell count, but tachycardic, febrile, tachypneic and oxygen saturations in the 70s on room air. Patient started on IV antibiotics, oxygen  Today, patient feeling much better. Out of bed to chair. No complaints. Breathing easier.  Assessment/Plan: Principal Problem:   Acute respiratory failure with hypoxia secondary to HCAP (healthcare-associated pneumonia): Continue antibiotics, oxygen supplementation. Will ambulate and check pulse ox off of oxygen. One more day of IV antibiotics Active Problems:   Hypothyroidism, postradioiodine therapy : Continue Synthroid   Diabetes mellitus ([place]) with acute hyperglycemia: A1c 8.5, worse from 1 year ago. Likely more elevated in the setting of acute illness. Sliding-scale and low-dose Lantus, titrated up slightly    Hypochloremia Morbid obesity: Patient meets criteria with BMI greater than 40 History of trisomy 21  Code Status: Full code   Family Communication: Parents at the bedside   Disposition Plan: Anticipate discharge tomorrow   Consultants:  None   Procedures:  None   Antibiotics:   Levaquin prior to admission 4 days  IV vancomycin 1/13-present  IV cefepime 1/13-present   Objective: BP 116/60 mmHg  Pulse 79  Temp(Src) 98.8 F (37.1 C) (Oral)  Resp 22  Ht 4' 8.5" (1.435 m)  Wt 118.343 kg (260 lb 14.4 oz)  BMI 57.47 kg/m2  SpO2 98%  Intake/Output Summary (Last 24 hours) at 01/16/15 1600 Last data filed at 01/16/15 0800  Gross  per 24 hour  Intake   1630 ml  Output      0 ml  Net   1630 ml   Filed Weights   01/14/15 1247 01/14/15 2007  Weight: 117.482 kg (259 lb) 118.343 kg (260 lb 14.4 oz)    Exam:   General:  Alert and oriented 2, no acute distress   Cardiovascular: Regular rate and rhythm, S1-S2   Respiratory: Decreased breath sounds throughout secondary to body habitus, decreased at bases -better airway exchange than previous day  Abdomen: Soft, obese, nontender, positive bowel sounds   Musculoskeletal: Trace pitting edema bilaterally    Data Reviewed: Basic Metabolic Panel:  Recent Labs Lab 01/14/15 1300 01/15/15 0625  NA 136 139  K 3.9 4.1  CL 98* 104  CO2 31 28  GLUCOSE 286* 271*  BUN 10 10  CREATININE 0.72 0.74  CALCIUM 9.0 8.1*   Liver Function Tests:  Recent Labs Lab 01/14/15 1300  AST 27  ALT 41  ALKPHOS 74  BILITOT 0.9  PROT 7.1  ALBUMIN 3.2*   No results for input(s): LIPASE, AMYLASE in the last 168 hours. No results for input(s): AMMONIA in the last 168 hours. CBC:  Recent Labs Lab 01/14/15 1300 01/15/15 0625  WBC 8.6 7.0  NEUTROABS 6.5 4.8  HGB 12.4 11.4*  HCT 37.1 34.5*  MCV 95.1 96.9  PLT 298 302   Cardiac Enzymes:   No results for input(s): CKTOTAL, CKMB, CKMBINDEX, TROPONINI in the last 168 hours. BNP (last 3 results)  Recent Labs  01/14/15 1315  BNP 80.0    ProBNP (last 3 results)  No results for input(s): PROBNP in the last 8760 hours.  CBG:  Recent Labs Lab 01/15/15 0802 01/15/15 1145 01/15/15 1642 01/15/15 2142 01/16/15 0737  GLUCAP 242* 254* 293* 208* 217*    Recent Results (from the past 240 hour(s))  Blood Culture (routine x 2)     Status: None (Preliminary result)   Collection Time: 01/14/15  1:02 PM  Result Value Ref Range Status   Specimen Description BLOOD DRAWN BY IV THERAPY  Final   Special Requests BOTTLES DRAWN AEROBIC AND ANAEROBIC 6 CC EACH  Final   Culture NO GROWTH 2 DAYS  Final   Report Status PENDING   Incomplete  Blood Culture (routine x 2)     Status: None (Preliminary result)   Collection Time: 01/14/15  1:07 PM  Result Value Ref Range Status   Specimen Description BLOOD DRAWN BY IV THERAPY  Final   Special Requests BOTTLES DRAWN AEROBIC AND ANAEROBIC 8 CC EACH  Final   Culture NO GROWTH 2 DAYS  Final   Report Status PENDING  Incomplete  Urine culture     Status: None (Preliminary result)   Collection Time: 01/14/15  1:55 PM  Result Value Ref Range Status   Specimen Description URINE, CLEAN CATCH  Final   Special Requests NONE  Final   Culture   Final    CULTURE REINCUBATED FOR BETTER GROWTH Performed at [facility]    Report Status PENDING  Incomplete     Studies: No results found.  Scheduled Meds: . antiseptic oral rinse  7 mL Mouth Rinse BID  . ceFEPime (MAXIPIME) IV  1 g Intravenous Q8H  . enoxaparin (LOVENOX) injection  60 mg Subcutaneous Q24H  . insulin aspart  0-20 Units Subcutaneous TID WC  . insulin glargine  5 Units Subcutaneous Daily  . levothyroxine  137 mcg Oral QAC breakfast  . vancomycin  1,000 mg Intravenous Q8H  . [START ON 01/27/2015] Vitamin D (Ergocalciferol)  50,000 Units Oral Q7 days    Continuous Infusions: . sodium chloride 50 mL/hr at 01/16/15 1130     Time spent: 15 minutes   [facility] Pager [phone] . If 7PM-7AM, please contact night-coverage at [email or web address], password [facility] 01/16/2015, 4:00 PM  LOS: 2 days

## 2015-01-17 LAB — BASIC METABOLIC PANEL
Anion gap: 8 (ref 5–15)
BUN: 11 mg/dL (ref 6–20)
CALCIUM: 9.2 mg/dL (ref 8.9–10.3)
CO2: 32 mmol/L (ref 22–32)
Chloride: 99 mmol/L — ABNORMAL LOW (ref 101–111)
Creatinine, Ser: 0.75 mg/dL (ref 0.44–1.00)
GFR calc Af Amer: >60 mL/min (ref >60)
GLUCOSE: 222 mg/dL — AB (ref 65–99)
Potassium: 4.2 mmol/L (ref 3.5–5.1)
Sodium: 139 mmol/L (ref 135–145)

## 2015-01-17 LAB — GLUCOSE, CAPILLARY
Glucose-Capillary: 187 mg/dL — ABNORMAL HIGH (ref 65–99)
Glucose-Capillary: 306 mg/dL — ABNORMAL HIGH (ref 65–99)

## 2015-01-17 LAB — LEGIONELLA ANTIGEN, URINE

## 2015-01-17 LAB — VANCOMYCIN, TROUGH: VANCOMYCIN TR: 17 ug/mL (ref 10.0–20.0)

## 2015-01-17 MED ORDER — LEVOFLOXACIN 500 MG PO TABS: 750.0000 mg | ORAL_TABLET | Freq: Every day | ORAL | Status: AC

## 2015-01-17 MED ORDER — ALBUTEROL SULFATE HFA 108 (90 BASE) MCG/ACT IN AERS: 2.0000 | INHALATION_SPRAY | Freq: Four times a day (QID) | RESPIRATORY_TRACT | Status: DC | PRN

## 2015-01-17 NOTE — Progress Notes (Signed)
Inpatient Diabetes Program Recommendations  AACE/ADA: New Consensus Statement on Inpatient Glycemic Control (2015)  Target Ranges:  Prepandial:   less than 140 mg/dL      Peak postprandial:   less than 180 mg/dL (1-2 hours)      Critically ill patients:  140 - 180 mg/dL   Review of Glycemic Control  Diabetes history: type 2 Outpatient Diabetes medications: Metformin 250 mg bid Current orders for Inpatient glycemic control: lantus 5 units and resistant correction tidwc  Inpatient Diabetes Program Recommendations: Please consider increase in lantus to 10 units.  Thank you Rosita Kea, RN, MSN, CDE  Diabetes Inpatient Program Office: [phone] Pager: [phone] 8:00 am to 5:00 pm

## 2015-01-17 NOTE — Discharge Summary (Signed)
Discharge Summary  Julie Carter [id number] DOB: [birth year]  PCP: Julie Dun, FNP  Admit date: 01/14/2015 Discharge date: 01/17/2015  Time spent: 25 minutes   Recommendations for Outpatient Follow-up:  1. New medication: Levaquin 750 mg by mouth daily 4 days. Patient has a previous prescription for Levaquin 500 and I discussed this with her mother. She will take existing prescription and use a pill and a half daily 2. Patient will follow-up with PCP in the next one week 3. New medication: Albuterol MDI 2 puffs 4 times a day as needed   Discharge Diagnoses:  Active Hospital Problems   Diagnosis Date Noted  . Acute respiratory failure with hypoxia ([place]) 01/14/2015  . HCAP (healthcare-associated pneumonia) 01/14/2015  . Hypochloremia   . Vitamin D deficiency 02/03/2014  . Morbid obesity ([place]) 02/02/2014  . Down syndrome 02/02/2014  . Diabetes mellitus ([place]) 10/03/2012  . Hypothyroidism, postradioiodine therapy 10/03/2012    Resolved Hospital Problems   Diagnosis Date Noted Date Resolved  No resolved problems to display.    Discharge Condition: Improved, being discharged home   Diet recommendation: Carb modified   Filed Weights   01/14/15 1247 01/14/15 2007  Weight: 117.482 kg (259 lb) 118.343 kg (260 lb 14.4 oz)    History of present illness:  patient is a 30 year old with past medical historyfemale with past medical history of trisomy 21, obesity and diabetes mellitus who had been treated for several days for a pneumonia as outpatient, but then sent over to the emergency room on 1/13 after her PCP noted that she was hypoxic in the office. In the emergency room, patient noted to have normal white blood cell count, but tachycardic, febrile, tachypneic and oxygen saturations in the 70s on room air. Patient started on IV antibiotics, oxygen   Hospital Course:  Principal Problem:   Acute respiratory failure with hypoxia ([place]) secondary to healthcare associated  pneumonia: Patient was continued on IV antibiotics and over the next few days, improved. By 1/16, she was able to ambulate with oxygen saturations only going as low as 90% on room air. She will be discharged home on by mouth Levaquin, which she was previously on, only at a higher dose 750 mg by mouth daily 4 more days. Active Problems:   Hypothyroidism, postradioiodine therapy   Diabetes mellitus ([place]) with acute hyperglycemia, uncontrolled not on long-term insulin: A1c at 8.5. Higher than year ago.sliding scale and low-dose Lantus while in hospital.   Down syndrome   Morbid obesity [facility]): Patient meets criteria with BMI greater than 40   Procedures:  None   Consultations:  None   Discharge Exam: BP 117/70 mmHg  Pulse 78  Temp(Src) 97.9 F (36.6 C) (Oral)  Resp 20  Ht 4' 8.5" (1.435 m)  Wt 118.343 kg (260 lb 14.4 oz)  BMI 57.47 kg/m2  SpO2 94%  General: Alert and oriented 2, no acute distress  Cardiovascular: Regular rate and rhythm, S1-S2  Respiratory: Clear to auscultation bilaterally   Discharge Instructions You were cared for by a hospitalist during your hospital stay. If you have any questions about your discharge medications or the care you received while you were in the hospital after you are discharged, you can call the unit and asked to speak with the hospitalist on call if the hospitalist that took care of you is not available. Once you are discharged, your primary care physician will handle any further medical issues. Please note that NO REFILLS for any discharge medications will be authorized once  you are discharged, as it is imperative that you return to your primary care physician (or establish a relationship with a primary care physician if you do not have one) for your aftercare needs so that they can reassess your need for medications and monitor your lab values.  Discharge Instructions    Diet - low sodium heart healthy    Complete by:  As directed       Increase activity slowly    Complete by:  As directed             Medication List    TAKE these medications        albuterol 108 (90 Base) MCG/ACT inhaler  Commonly known as:  PROVENTIL HFA;VENTOLIN HFA  Inhale 2 puffs into the lungs every 6 (six) hours as needed for wheezing or shortness of breath.     glucose blood test strip  Use as instructed     HYDROcodone-homatropine 5-1.5 MG/5ML syrup  Commonly known as:  HYCODAN  Take 5 mLs by mouth every 6 (six) hours as needed for cough.     levofloxacin 500 MG tablet  Commonly known as:  LEVAQUIN  Take 1.5 tablets (750 mg total) by mouth daily.     levothyroxine 137 MCG tablet  Commonly known as:  SYNTHROID, LEVOTHROID  Take 1 pill every day and 2 pills on Sunday     metFORMIN 500 MG tablet  Commonly known as:  GLUCOPHAGE  Take 0.5 tablets (250 mg total) by mouth 2 (two) times daily.     Vitamin D (Ergocalciferol) 50000 units Caps capsule  Commonly known as:  DRISDOL  Take 1 capsule (50,000 Units total) by mouth every 7 (seven) days.       No Known Allergies     Follow-up Information    Follow up with Julie Dun, FNP In 1 week.   Specialty:  Family Medicine   Why:  already have an appt with Julie Carter on 01/24/15 at 11:10   Contact information:   [employer] [place] [address] [phone]        The results of significant diagnostics from this hospitalization (including imaging, microbiology, ancillary and laboratory) are listed below for reference.    Significant Diagnostic Studies: Dg Chest 2 View  01/14/2015  CLINICAL DATA:  Recent diagnosis of pneumonia, patient has experience no clinical improvement. EXAM: CHEST  2 VIEW COMPARISON:  PA and lateral chest x-ray of January 11, 2015 FINDINGS: The study is limited due to scatter effects secondary to the patient's body habitus. The lungs are adequately inflated. The interstitial markings remain increased bilaterally. Confluent alveolar opacity in  the lingula has become less conspicuous. The cardiac silhouette is top-normal to mildly enlarged. The central pulmonary vascularity remains prominent. IMPRESSION: 1. Mild interval improvement in alveolar infiltrate in the lingula. Persistently increased interstitial markings in both lungs are present. Additional follow-up radiographs following antibiotic therapy will be needed. 2. The cardiac silhouette appears slightly larger today. The pulmonary vascularity is slightly less distinct and appears mildly engorged. Are there other clinical or laboratory findings that might suggest low-grade CHF? This would be unusual at this age but correlation clinically is needed. Electronically Signed   By: David  Martinique M.D.   On: 01/14/2015 11:38   Dg Chest 2 View  01/11/2015  CLINICAL DATA:  Cough, chest congestion, shortness of breath, URI symptoms. History of leukemia. EXAM: CHEST  2 VIEW COMPARISON:  Chest x-ray of July 10, 2004 FINDINGS: There patchy alveolar opacities  in the left lung with partial obscuration of the hemidiaphragm. The abnormalities appear lie in the left lower lobe and some in the lingula. The right lung is grossly clear. The heart and pulmonary vascularity are normal. The bony thorax exhibits no acute abnormality. IMPRESSION: Alveolar pneumonia in the left lower lobe likely in the lingula. Followup PA and lateral chest X-ray is recommended in 3-4 weeks following trial of antibiotic therapy to ensure resolution and exclude underlying malignancy. Electronically Signed   By: David  Martinique M.D.   On: 01/11/2015 11:48   Ct Angio Chest Pe W/cm &/or Wo Cm  01/14/2015  CLINICAL DATA:  Shortness of breath and hypoxia. History of Down's syndrome, diabetes and anemia. EXAM: CT ANGIOGRAPHY CHEST WITH CONTRAST TECHNIQUE: Multidetector CT imaging of the chest was performed using the standard protocol during bolus administration of intravenous contrast. Multiplanar CT image reconstructions and MIPs were obtained to  evaluate the vascular anatomy. CONTRAST:  138mL OMNIPAQUE IOHEXOL 350 MG/ML SOLN COMPARISON:  Chest x-ray 01/14/2015 and CT abdomen 01/07/2004 FINDINGS: Lungs are adequately inflated and demonstrate patchy bilateral airspace opacification suggesting multifocal pneumonia. No evidence effusion. Airways are within normal. There is mild cardiomegaly. Pulmonary arterial system is normal without evidence of emboli. 1.1 cm and 1.3 cm lymph nodes in the right peritracheal region. 1 cm AP window lymph node. These lymph nodes are likely reactive. No evidence of hilar or axillary adenopathy. Remaining mediastinal structures are within normal. Images through the upper abdomen demonstrate diffuse decreased density of the liver. There are moderate overlying soft tissues present. Minimal degenerate change of the spine. Review of the MIP images confirms the above findings. IMPRESSION: Patchy bilateral airspace process likely multifocal pneumonia. Mild mediastinal adenopathy likely reactive. No evidence of pulmonary embolism. Electronically Signed   By: Marin Olp M.D.   On: 01/14/2015 16:53    Microbiology: Recent Results (from the past 240 hour(s))  Blood Culture (routine x 2)     Status: None (Preliminary result)   Collection Time: 01/14/15  1:02 PM  Result Value Ref Range Status   Specimen Description BLOOD DRAWN BY IV THERAPY  Final   Special Requests BOTTLES DRAWN AEROBIC AND ANAEROBIC 6 CC EACH  Final   Culture NO GROWTH 3 DAYS  Final   Report Status PENDING  Incomplete  Blood Culture (routine x 2)     Status: None (Preliminary result)   Collection Time: 01/14/15  1:07 PM  Result Value Ref Range Status   Specimen Description BLOOD DRAWN BY IV THERAPY  Final   Special Requests BOTTLES DRAWN AEROBIC AND ANAEROBIC 8 CC EACH  Final   Culture NO GROWTH 3 DAYS  Final   Report Status PENDING  Incomplete  Urine culture     Status: None   Collection Time: 01/14/15  1:55 PM  Result Value Ref Range Status    Specimen Description URINE, CLEAN CATCH  Final   Special Requests NONE  Final   Culture   Final    30,000 COLONIES/mL YEAST Performed at [facility]    Report Status 01/16/2015 FINAL  Final     Labs: Basic Metabolic Panel:  Recent Labs Lab 01/14/15 1300 01/15/15 0625 01/17/15 0624  NA 136 139 139  K 3.9 4.1 4.2  CL 98* 104 99*  CO2 31 28 32  GLUCOSE 286* 271* 222*  BUN 10 10 11   CREATININE 0.72 0.74 0.75  CALCIUM 9.0 8.1* 9.2   Liver Function Tests:  Recent Labs Lab 01/14/15 1300  AST  27  ALT 41  ALKPHOS 74  BILITOT 0.9  PROT 7.1  ALBUMIN 3.2*   No results for input(s): LIPASE, AMYLASE in the last 168 hours. No results for input(s): AMMONIA in the last 168 hours. CBC:  Recent Labs Lab 01/14/15 1300 01/15/15 0625  WBC 8.6 7.0  NEUTROABS 6.5 4.8  HGB 12.4 11.4*  HCT 37.1 34.5*  MCV 95.1 96.9  PLT 298 302   Cardiac Enzymes: No results for input(s): CKTOTAL, CKMB, CKMBINDEX, TROPONINI in the last 168 hours. BNP: BNP (last 3 results)  Recent Labs  01/14/15 1315  BNP 80.0    ProBNP (last 3 results) No results for input(s): PROBNP in the last 8760 hours.  CBG:  Recent Labs Lab 01/16/15 1119 01/16/15 1637 01/16/15 2139 01/17/15 0734 01/17/15 1132  GLUCAP 205* 290* 236* 187* 306*       Signed:  KRISHNAN,SENDIL K  [facility] 01/17/2015, 3:08 PM

## 2015-01-17 NOTE — Progress Notes (Signed)
Discharge instructions given, verbalized understanding, out in stable condition via w/c with staff. 

## 2015-01-17 NOTE — Progress Notes (Signed)
SATURATION QUALIFICATIONS: (This note is used to comply with regulatory documentation for home oxygen)  Patient Saturations on Room Air at Rest = 94%  Patient Saturations on Room Air while Ambulating = 100%  Patient Saturations on Room Air after Ambulating= 94%  Please briefly explain why patient needs home oxygen:

## 2015-01-19 LAB — CULTURE, BLOOD (ROUTINE X 2)
Culture: NO GROWTH
Culture: NO GROWTH

## 2015-01-24 ENCOUNTER — Encounter: Payer: Self-pay | Admitting: Family

## 2015-01-24 ENCOUNTER — Ambulatory Visit (INDEPENDENT_AMBULATORY_CARE_PROVIDER_SITE_OTHER): Payer: Medicaid Other | Admitting: Family

## 2015-01-24 VITALS — BP 113/71 | HR 94 | Temp 97.7°F | Ht <= 58 in | Wt 256.0 lb

## 2015-01-24 DIAGNOSIS — J9601 Acute respiratory failure with hypoxia: Secondary | ICD-10-CM | POA: Diagnosis not present

## 2015-01-24 DIAGNOSIS — E0800 Diabetes mellitus due to underlying condition with hyperosmolarity without nonketotic hyperglycemic-hyperosmolar coma (NKHHC): Secondary | ICD-10-CM

## 2015-01-24 DIAGNOSIS — Q909 Down syndrome, unspecified: Secondary | ICD-10-CM

## 2015-01-24 DIAGNOSIS — J189 Pneumonia, unspecified organism: Secondary | ICD-10-CM

## 2015-01-24 MED ORDER — METFORMIN HCL 1000 MG PO TABS: 1000.0000 mg | ORAL_TABLET | Freq: Two times a day (BID) | ORAL | Status: DC

## 2015-01-24 NOTE — Progress Notes (Signed)
   Subjective:    Patient ID: Julie Carter, female    DOB: 12-09-1985, 30 y.o.   MRN: [id number]  HPI Pt presents to the office today for hospital follow up. Pt was admitted for CAP and low O2 and was discharged on 01/17/15. Pt was given IV antibiotics and given rx of Levaquin 750 mg for 4 days. PT's mother reports pt's breathing is improved. Mother denies any SOB, wheezing, or fever. Pt's O2 sat today on room air is 99%.    Review of Systems  Constitutional: Negative.   HENT: Negative.   Eyes: Negative.   Respiratory: Negative.  Negative for shortness of breath.   Cardiovascular: Negative.  Negative for palpitations.  Gastrointestinal: Negative.   Endocrine: Negative.   Genitourinary: Negative.   Musculoskeletal: Negative.   Neurological: Negative.  Negative for headaches.  Hematological: Negative.   Psychiatric/Behavioral: Negative.   All other systems reviewed and are negative.      Objective:   Physical Exam  Constitutional: She is oriented to person, place, and time. She appears well-developed and well-nourished. No distress.  HENT:  Head: Normocephalic and atraumatic.  Right Ear: External ear normal.  Left Ear: External ear normal.  Eyes: Pupils are equal, round, and reactive to light.  Neck: Normal range of motion. Neck supple. No thyromegaly present.  Cardiovascular: Normal rate, regular rhythm, normal heart sounds and intact distal pulses.   No murmur heard. Pulmonary/Chest: Effort normal and breath sounds normal. No respiratory distress. She has no wheezes.  Abdominal: Soft. Bowel sounds are normal. She exhibits no distension. There is no tenderness.  Musculoskeletal: Normal range of motion. She exhibits no edema or tenderness.  Neurological: She is alert and oriented to person, place, and time. She has normal reflexes. No cranial nerve deficit.  Skin: Skin is warm and dry.  Psychiatric: She has a normal mood and affect. Her behavior is normal. Judgment and  thought content normal.  Vitals reviewed.   BP 113/71 mmHg  Pulse 94  Temp(Src) 97.7 F (36.5 C) (Oral)  Ht 4' 8.5" (1.435 m)  Wt 256 lb (116.121 kg)  BMI 56.39 kg/m2  SpO2 99%       Assessment & Plan:  1. HCAP (healthcare-associated pneumonia)  2. Acute respiratory failure with hypoxia (HCC)  3. Morbid obesity, unspecified obesity type ([place]) -Weight loss and exercise encouraged -Discussed the importance with mother of being involved in weight lost  4. Down syndrome  5. Diabetes mellitus due to underlying condition with hyperosmolarity without coma, without long-term current use of insulin (HCC) -Pt's metformin increased to 1000 mg BID from 500 mg BID -Low carb diet discussed in length -RTO in 3 months for lab work - metFORMIN (GLUCOPHAGE) 1000 MG tablet; Take 1 tablet (1,000 mg total) by mouth 2 (two) times daily with a meal.  Dispense: 180 tablet; Refill: [place], FNP

## 2015-01-24 NOTE — Patient Instructions (Signed)
Diabetes Mellitus and Food It is important for you to manage your blood sugar (glucose) level. Your blood glucose level can be greatly affected by what you eat. Eating healthier foods in the appropriate amounts throughout the day at about the same time each day will help you control your blood glucose level. It can also help slow or prevent worsening of your diabetes mellitus. Healthy eating may even help you improve the level of your blood pressure and reach or maintain a healthy weight.  General recommendations for healthful eating and cooking habits include:  Eating meals and snacks regularly. Avoid going long periods of time without eating to lose weight.  Eating a diet that consists mainly of plant-based foods, such as fruits, vegetables, nuts, legumes, and whole grains.  Using low-heat cooking methods, such as baking, instead of high-heat cooking methods, such as deep frying. Work with your dietitian to make sure you understand how to use the Nutrition Facts information on food labels. HOW CAN FOOD AFFECT ME? Carbohydrates Carbohydrates affect your blood glucose level more than any other type of food. Your dietitian will help you determine how many carbohydrates to eat at each meal and teach you how to count carbohydrates. Counting carbohydrates is important to keep your blood glucose at a healthy level, especially if you are using insulin or taking certain medicines for diabetes mellitus. Alcohol Alcohol can cause sudden decreases in blood glucose (hypoglycemia), especially if you use insulin or take certain medicines for diabetes mellitus. Hypoglycemia can be a life-threatening condition. Symptoms of hypoglycemia (sleepiness, dizziness, and disorientation) are similar to symptoms of having too much alcohol.  If your health care provider has given you approval to drink alcohol, do so in moderation and use the following guidelines:  Women should not have more than one drink per day, and men  should not have more than two drinks per day. One drink is equal to:  12 oz of beer.  5 oz of wine.  1 oz of hard liquor.  Do not drink on an empty stomach.  Keep yourself hydrated. Have water, diet soda, or unsweetened iced tea.  Regular soda, juice, and other mixers might contain a lot of carbohydrates and should be counted. WHAT FOODS ARE NOT RECOMMENDED? As you make food choices, it is important to remember that all foods are not the same. Some foods have fewer nutrients per serving than other foods, even though they might have the same number of calories or carbohydrates. It is difficult to get your body what it needs when you eat foods with fewer nutrients. Examples of foods that you should avoid that are high in calories and carbohydrates but low in nutrients include:  Trans fats (most processed foods list trans fats on the Nutrition Facts label).  Regular soda.  Juice.  Candy.  Sweets, such as cake, pie, doughnuts, and cookies.  Fried foods. WHAT FOODS CAN I EAT? Eat nutrient-rich foods, which will nourish your body and keep you healthy. The food you should eat also will depend on several factors, including:  The calories you need.  The medicines you take.  Your weight.  Your blood glucose level.  Your blood pressure level.  Your cholesterol level. You should eat a variety of foods, including:  Protein.  Lean cuts of meat.  Proteins low in saturated fats, such as fish, egg whites, and beans. Avoid processed meats.  Fruits and vegetables.  Fruits and vegetables that may help control blood glucose levels, such as apples, mangoes, and   yams.  Dairy products.  Choose fat-free or low-fat dairy products, such as milk, yogurt, and cheese.  Grains, bread, pasta, and rice.  Choose whole grain products, such as multigrain bread, whole oats, and brown rice. These foods may help control blood pressure.  Fats.  Foods containing healthful fats, such as nuts,  avocado, olive oil, canola oil, and fish. DOES EVERYONE WITH DIABETES MELLITUS HAVE THE SAME MEAL PLAN? Because every person with diabetes mellitus is different, there is not one meal plan that works for everyone. It is very important that you meet with a dietitian who will help you create a meal plan that is just right for you.   This information is not intended to replace advice given to you by your health care provider. Make sure you discuss any questions you have with your health care provider.   Document Released: 09/14/2004 Document Revised: 01/08/2014 Document Reviewed: 11/14/2012 Elsevier Interactive Patient Education 2016 [employer]  

## 2015-02-02 ENCOUNTER — Other Ambulatory Visit: Payer: Self-pay

## 2015-02-02 NOTE — Telephone Encounter (Signed)
Last seen 01/24/15  Julie Carter  Last Vit D 02/02/14  12.6

## 2015-02-03 MED ORDER — VITAMIN D (ERGOCALCIFEROL) 1.25 MG (50000 UNIT) PO CAPS: 50000.0000 [IU] | ORAL_CAPSULE | ORAL | Status: DC

## 2015-03-22 ENCOUNTER — Encounter: Payer: Self-pay | Admitting: Family

## 2015-03-22 ENCOUNTER — Ambulatory Visit (INDEPENDENT_AMBULATORY_CARE_PROVIDER_SITE_OTHER): Payer: Medicaid Other | Admitting: Family

## 2015-03-22 VITALS — BP 121/78 | HR 93 | Temp 98.0°F | Ht <= 58 in | Wt 258.0 lb

## 2015-03-22 DIAGNOSIS — Z23 Encounter for immunization: Secondary | ICD-10-CM

## 2015-03-22 DIAGNOSIS — Z6841 Body Mass Index (BMI) 40.0 and over, adult: Secondary | ICD-10-CM | POA: Diagnosis not present

## 2015-03-22 DIAGNOSIS — Q909 Down syndrome, unspecified: Secondary | ICD-10-CM

## 2015-03-22 DIAGNOSIS — Z0000 Encounter for general adult medical examination without abnormal findings: Secondary | ICD-10-CM | POA: Diagnosis not present

## 2015-03-22 DIAGNOSIS — E878 Other disorders of electrolyte and fluid balance, not elsewhere classified: Secondary | ICD-10-CM

## 2015-03-22 DIAGNOSIS — E0800 Diabetes mellitus due to underlying condition with hyperosmolarity without nonketotic hyperglycemic-hyperosmolar coma (NKHHC): Secondary | ICD-10-CM

## 2015-03-22 DIAGNOSIS — E890 Postprocedural hypothyroidism: Secondary | ICD-10-CM

## 2015-03-22 DIAGNOSIS — E559 Vitamin D deficiency, unspecified: Secondary | ICD-10-CM

## 2015-03-22 DIAGNOSIS — Z01419 Encounter for gynecological examination (general) (routine) without abnormal findings: Secondary | ICD-10-CM

## 2015-03-22 LAB — BAYER DCA HB A1C WAIVED: HB A1C (BAYER DCA - WAIVED): 7.4 % — ABNORMAL HIGH (ref <7.0)

## 2015-03-22 NOTE — Progress Notes (Signed)
   Subjective:    Patient ID: Julie Carter, female    DOB: [birth year], 30 y.o.   MRN: [id number]  HPI    Review of Systems     Objective:   Physical Exam        Assessment & Plan:

## 2015-03-22 NOTE — Addendum Note (Signed)
Addended by: Shelbie Ammons on: 03/22/2015 11:20 AM   Modules accepted: Orders

## 2015-03-22 NOTE — Patient Instructions (Signed)
Health Maintenance, Female Adopting a healthy lifestyle and getting preventive care can go a long way to promote health and wellness. Talk with your health care provider about what schedule of regular examinations is right for you. This is a good chance for you to check in with your provider about disease prevention and staying healthy. In between checkups, there are plenty of things you can do on your own. Experts have done a lot of research about which lifestyle changes and preventive measures are most likely to keep you healthy. Ask your health care provider for more information. WEIGHT AND DIET  Eat a healthy diet  Be sure to include plenty of vegetables, fruits, low-fat dairy products, and lean protein.  Do not eat a lot of foods high in solid fats, added sugars, or salt.  Get regular exercise. This is one of the most important things you can do for your health.  Most adults should exercise for at least 150 minutes each week. The exercise should increase your heart rate and make you sweat (moderate-intensity exercise).  Most adults should also do strengthening exercises at least twice a week. This is in addition to the moderate-intensity exercise.  Maintain a healthy weight  Body mass index (BMI) is a measurement that can be used to identify possible weight problems. It estimates body fat based on height and weight. Your health care provider can help determine your BMI and help you achieve or maintain a healthy weight.  For females 20 years of age and older:   A BMI below 18.5 is considered underweight.  A BMI of 18.5 to 24.9 is normal.  A BMI of 25 to 29.9 is considered overweight.  A BMI of 30 and above is considered obese.  Watch levels of cholesterol and blood lipids  You should start having your blood tested for lipids and cholesterol at 31 years of age, then have this test every 5 years.  You may need to have your cholesterol levels checked more often if:  Your lipid  or cholesterol levels are high.  You are older than 31 years of age.  You are at high risk for heart disease.  CANCER SCREENING   Lung Cancer  Lung cancer screening is recommended for adults 55-80 years old who are at high risk for lung cancer because of a history of smoking.  A yearly low-dose CT scan of the lungs is recommended for people who:  Currently smoke.  Have quit within the past 15 years.  Have at least a 30-pack-year history of smoking. A pack year is smoking an average of one pack of cigarettes a day for 1 year.  Yearly screening should continue until it has been 15 years since you quit.  Yearly screening should stop if you develop a health problem that would prevent you from having lung cancer treatment.  Breast Cancer  Practice breast self-awareness. This means understanding how your breasts normally appear and feel.  It also means doing regular breast self-exams. Let your health care provider know about any changes, no matter how small.  If you are in your 20s or 30s, you should have a clinical breast exam (CBE) by a health care provider every 1-3 years as part of a regular health exam.  If you are 40 or older, have a CBE every year. Also consider having a breast X-ray (mammogram) every year.  If you have a family history of breast cancer, talk to your health care provider about genetic screening.  If you   are at high risk for breast cancer, talk to your health care provider about having an MRI and a mammogram every year.  Breast cancer gene (BRCA) assessment is recommended for women who have family members with BRCA-related cancers. BRCA-related cancers include:  Breast.  Ovarian.  Tubal.  Peritoneal cancers.  Results of the assessment will determine the need for genetic counseling and BRCA1 and BRCA2 testing. Cervical Cancer Your health care provider may recommend that you be screened regularly for cancer of the pelvic organs (ovaries, uterus, and  vagina). This screening involves a pelvic examination, including checking for microscopic changes to the surface of your cervix (Pap test). You may be encouraged to have this screening done every 3 years, beginning at age 21.  For women ages 30-65, health care providers may recommend pelvic exams and Pap testing every 3 years, or they may recommend the Pap and pelvic exam, combined with testing for human papilloma virus (HPV), every 5 years. Some types of HPV increase your risk of cervical cancer. Testing for HPV may also be done on women of any age with unclear Pap test results.  Other health care providers may not recommend any screening for nonpregnant women who are considered low risk for pelvic cancer and who do not have symptoms. Ask your health care provider if a screening pelvic exam is right for you.  If you have had past treatment for cervical cancer or a condition that could lead to cancer, you need Pap tests and screening for cancer for at least 20 years after your treatment. If Pap tests have been discontinued, your risk factors (such as having a new sexual partner) need to be reassessed to determine if screening should resume. Some women have medical problems that increase the chance of getting cervical cancer. In these cases, your health care provider may recommend more frequent screening and Pap tests. Colorectal Cancer  This type of cancer can be detected and often prevented.  Routine colorectal cancer screening usually begins at 31 years of age and continues through 31 years of age.  Your health care provider may recommend screening at an earlier age if you have risk factors for colon cancer.  Your health care provider may also recommend using home test kits to check for hidden blood in the stool.  A small camera at the end of a tube can be used to examine your colon directly (sigmoidoscopy or colonoscopy). This is done to check for the earliest forms of colorectal  cancer.  Routine screening usually begins at age 50.  Direct examination of the colon should be repeated every 5-10 years through 31 years of age. However, you may need to be screened more often if early forms of precancerous polyps or small growths are found. Skin Cancer  Check your skin from head to toe regularly.  Tell your health care provider about any new moles or changes in moles, especially if there is a change in a mole's shape or color.  Also tell your health care provider if you have a mole that is larger than the size of a pencil eraser.  Always use sunscreen. Apply sunscreen liberally and repeatedly throughout the day.  Protect yourself by wearing long sleeves, pants, a wide-brimmed hat, and sunglasses whenever you are outside. HEART DISEASE, DIABETES, AND HIGH BLOOD PRESSURE   High blood pressure causes heart disease and increases the risk of stroke. High blood pressure is more likely to develop in:  People who have blood pressure in the high end   of the normal range (130-139/85-89 mm Hg).  People who are overweight or obese.  People who are African American.  If you are 32-45 years of age, have your blood pressure checked every 3-5 years. If you are 13 years of age or older, have your blood pressure checked every year. You should have your blood pressure measured twice--once when you are at a hospital or clinic, and once when you are not at a hospital or clinic. Record the average of the two measurements. To check your blood pressure when you are not at a hospital or clinic, you can use:  An automated blood pressure machine at a pharmacy.  A home blood pressure monitor.  If you are between 29 years and 44 years old, ask your health care provider if you should take aspirin to prevent strokes.  Have regular diabetes screenings. This involves taking a blood sample to check your fasting blood sugar level.  If you are at a normal weight and have a low risk for diabetes,  have this test once every three years after 31 years of age.  If you are overweight and have a high risk for diabetes, consider being tested at a younger age or more often. PREVENTING INFECTION  Hepatitis B  If you have a higher risk for hepatitis B, you should be screened for this virus. You are considered at high risk for hepatitis B if:  You were born in a country where hepatitis B is common. Ask your health care provider which countries are considered high risk.  Your parents were born in a high-risk country, and you have not been immunized against hepatitis B (hepatitis B vaccine).  You have HIV or AIDS.  You use needles to inject street drugs.  You live with someone who has hepatitis B.  You have had sex with someone who has hepatitis B.  You get hemodialysis treatment.  You take certain medicines for conditions, including cancer, organ transplantation, and autoimmune conditions. Hepatitis C  Blood testing is recommended for:  Everyone born from [birth year] through 1966.  Anyone with known risk factors for hepatitis C. Sexually transmitted infections (STIs)  You should be screened for sexually transmitted infections (STIs) including gonorrhea and chlamydia if:  You are sexually active and are younger than 31 years of age.  You are older than 31 years of age and your health care provider tells you that you are at risk for this type of infection.  Your sexual activity has changed since you were last screened and you are at an increased risk for chlamydia or gonorrhea. Ask your health care provider if you are at risk.  If you do not have HIV, but are at risk, it may be recommended that you take a prescription medicine daily to prevent HIV infection. This is called pre-exposure prophylaxis (PrEP). You are considered at risk if:  You are sexually active and do not regularly use condoms or know the HIV status of your partner(s).  You take drugs by injection.  You are sexually  active with a partner who has HIV. Talk with your health care provider about whether you are at high risk of being infected with HIV. If you choose to begin PrEP, you should first be tested for HIV. You should then be tested every 3 months for as long as you are taking PrEP.  PREGNANCY   If you are premenopausal and you may become pregnant, ask your health care provider about preconception counseling.  If you may  become pregnant, take 400 to 800 micrograms (mcg) of folic acid every day.  If you want to prevent pregnancy, talk to your health care provider about birth control (contraception). OSTEOPOROSIS AND MENOPAUSE   Osteoporosis is a disease in which the bones lose minerals and strength with aging. This can result in serious bone fractures. Your risk for osteoporosis can be identified using a bone density scan.  If you are 58 years of age or older, or if you are at risk for osteoporosis and fractures, ask your health care provider if you should be screened.  Ask your health care provider whether you should take a calcium or vitamin D supplement to lower your risk for osteoporosis.  Menopause may have certain physical symptoms and risks.  Hormone replacement therapy may reduce some of these symptoms and risks. Talk to your health care provider about whether hormone replacement therapy is right for you.  HOME CARE INSTRUCTIONS   Schedule regular health, dental, and eye exams.  Stay current with your immunizations.   Do not use any tobacco products including cigarettes, chewing tobacco, or electronic cigarettes.  If you are pregnant, do not drink alcohol.  If you are breastfeeding, limit how much and how often you drink alcohol.  Limit alcohol intake to no more than 1 drink per day for nonpregnant women. One drink equals 12 ounces of beer, 5 ounces of wine, or 1 ounces of hard liquor.  Do not use street drugs.  Do not share needles.  Ask your health care provider for help if  you need support or information about quitting drugs.  Tell your health care provider if you often feel depressed.  Tell your health care provider if you have ever been abused or do not feel safe at home.   This information is not intended to replace advice given to you by your health care provider. Make sure you discuss any questions you have with your health care provider.   Document Released: 07/03/2010 Document Revised: 01/08/2014 Document Reviewed: 11/19/2012 Elsevier Interactive Patient Education [employer].

## 2015-03-22 NOTE — Progress Notes (Signed)
Subjective:    Patient ID: Julie Carter, female    DOB: [birth year], 30 y.o.   MRN: [id number]  Pt presents for a CPE with pap. Pt has down syndrome.  Diabetes She presents for her follow-up diabetic visit. She has type 2 diabetes mellitus. Her disease course has been stable. Pertinent negatives for hypoglycemia include no confusion, dizziness, headaches or nervousness/anxiousness. Pertinent negatives for diabetes include no blurred vision, no fatigue, no foot paresthesias, no foot ulcerations and no visual change. Pertinent negatives for hypoglycemia complications include no blackouts and no hospitalization. Symptoms are stable. Pertinent negatives for diabetic complications include no CVA, heart disease, nephropathy or peripheral neuropathy. Risk factors for coronary artery disease include obesity. Current diabetic treatment includes oral agent (monotherapy). She is compliant with treatment all of the time. Her weight is increasing steadily. She is following a generally unhealthy diet. She rarely participates in exercise. Her breakfast blood glucose range is generally 140-180 mg/dl. Eye exam is not current.  Thyroid Problem Presents for follow-up visit. Symptoms include dry skin and weight gain. Patient reports no anxiety, constipation, depressed mood, diarrhea, fatigue, heat intolerance, nail problem, palpitations or visual change. The symptoms have been stable. Past treatments include levothyroxine. The treatment provided significant relief. Her past medical history is significant for diabetes and obesity. There is no history of hyperlipidemia.    *Pt has an endocrinology   that she see's once a year who manages her thyroid.   Review of Systems  Constitutional: Positive for weight gain. Negative for fatigue.  HENT: Negative.   Eyes: Negative.  Negative for blurred vision.  Respiratory: Negative.  Negative for shortness of breath.   Cardiovascular: Negative.  Negative for palpitations.    Gastrointestinal: Negative.  Negative for diarrhea and constipation.  Endocrine: Negative.  Negative for heat intolerance.  Genitourinary: Negative.   Musculoskeletal: Negative.   Neurological: Negative.  Negative for dizziness and headaches.  Hematological: Negative.   Psychiatric/Behavioral: Negative.  Negative for confusion. The patient is not nervous/anxious.   All other systems reviewed and are negative.      Objective:   Physical Exam  Constitutional: She is oriented to person, place, and time. She appears well-developed and well-nourished. No distress.  HENT:  Head: Normocephalic and atraumatic.  Right Ear: External ear normal.  Left Ear: External ear normal.  Nose: Nose normal.  Mouth/Throat: Oropharynx is clear and moist.  Eyes: Pupils are equal, round, and reactive to light.  Neck: Normal range of motion. Neck supple. No thyromegaly present.  Cardiovascular: Normal rate, regular rhythm, normal heart sounds and intact distal pulses.   No murmur heard. Pulmonary/Chest: Effort normal and breath sounds normal. No respiratory distress. She has no wheezes. Right breast exhibits no inverted nipple, no mass, no nipple discharge, no skin change and no tenderness. Left breast exhibits no inverted nipple, no mass, no nipple discharge, no skin change and no tenderness. Breasts are symmetrical.  Abdominal: Soft. Bowel sounds are normal. She exhibits no distension. There is no tenderness.  Genitourinary: Vagina normal.  Bimanual exam- no adnexal masses, ovaries nonpalpable   Cervix parous and pink- No discharge, erythemas   Musculoskeletal: Normal range of motion. She exhibits no edema or tenderness.  Neurological: She is alert and oriented to person, place, and time. She has normal reflexes. No cranial nerve deficit.  Skin: Skin is warm and dry.  Psychiatric: She has a normal mood and affect. Her behavior is normal. Judgment and thought content normal.  Vitals reviewed.   BP  121/78 mmHg  Pulse 93  Temp(Src) 98 F (36.7 C) (Oral)  Ht 4' 8.5" (1.435 m)  Wt 258 lb (117.028 kg)  BMI 56.83 kg/m2       Assessment & Plan:  1. Hypothyroidism, postradioiodine therapy - CMP14+EGFR - Pap IG w/ reflex to HPV when ASC-U  2. Diabetes mellitus due to underlying condition with hyperosmolarity without coma, without long-term current use of insulin (HCC) - Bayer DCA Hb A1c Waived - CMP14+EGFR - Microalbumin / creatinine urine ratio - Ambulatory referral to Ophthalmology  3. Hypochloremia - CMP14+EGFR  4. Vitamin D deficiency - CMP14+EGFR  5. Morbid obesity, unspecified obesity type ([place]) - CMP14+EGFR  6. Down syndrome - CMP14+EGFR  7. Morbid obesity with BMI of 50.0-59.9, adult (HCC) - CMP14+EGFR  8. Annual physical exam - Bayer DCA Hb A1c Waived - CMP14+EGFR - Microalbumin / creatinine urine ratio - Thyroid Panel With TSH - Pap IG w/ reflex to HPV when ASC-U - Lipid panel  9. Encounter for routine gynecological examination - CMP14+EGFR - Pap IG w/ reflex to HPV when ASC-U   Continue all meds Labs pending Health Maintenance reviewed Diet and exercise encouraged RTO 6 months  Evelina Dun, FNP

## 2015-03-23 LAB — CMP14+EGFR
A/G RATIO: 1.3 (ref 1.2–2.2)
ALT: 53 IU/L — AB (ref 0–32)
AST: 29 IU/L (ref 0–40)
Albumin: 3.9 g/dL (ref 3.5–5.5)
Alkaline Phosphatase: 81 IU/L (ref 39–117)
BUN/Creatinine Ratio: 12 (ref 8–20)
BUN: 9 mg/dL (ref 6–20)
Bilirubin Total: 0.3 mg/dL (ref 0.0–1.2)
CALCIUM: 9.2 mg/dL (ref 8.7–10.2)
CO2: 31 mmol/L — AB (ref 18–29)
CREATININE: 0.77 mg/dL (ref 0.57–1.00)
Chloride: 97 mmol/L (ref 96–106)
GFR, EST AFRICAN AMERICAN: 119 mL/min/{1.73_m2} (ref >59)
GFR, EST NON AFRICAN AMERICAN: 103 mL/min/{1.73_m2} (ref >59)
Globulin, Total: 3 g/dL (ref 1.5–4.5)
Glucose: 185 mg/dL — ABNORMAL HIGH (ref 65–99)
Potassium: 4.1 mmol/L (ref 3.5–5.2)
Sodium: 141 mmol/L (ref 134–144)
TOTAL PROTEIN: 6.9 g/dL (ref 6.0–8.5)

## 2015-03-23 LAB — LIPID PANEL
CHOL/HDL RATIO: 3.2 ratio (ref 0.0–4.4)
Cholesterol, Total: 152 mg/dL (ref 100–199)
HDL: 47 mg/dL (ref >39)
LDL Calculated: 77 mg/dL (ref 0–99)
Triglycerides: 140 mg/dL (ref 0–149)
VLDL Cholesterol Cal: 28 mg/dL (ref 5–40)

## 2015-03-23 LAB — MICROALBUMIN / CREATININE URINE RATIO
Creatinine, Urine: 126.6 mg/dL
MICROALB/CREAT RATIO: 3.6 mg/g{creat} (ref 0.0–30.0)
MICROALBUM., U, RANDOM: 4.6 ug/mL

## 2015-03-23 LAB — THYROID PANEL WITH TSH
FREE THYROXINE INDEX: 2.8 (ref 1.2–4.9)
T3 UPTAKE RATIO: 28 % (ref 24–39)
T4 TOTAL: 10.1 ug/dL (ref 4.5–12.0)
TSH: 3.39 u[IU]/mL (ref 0.450–4.500)

## 2015-03-24 LAB — PAP IG W/ RFLX HPV ASCU: PAP Smear Comment: 0

## 2015-04-12 LAB — HM DIABETES EYE EXAM

## 2015-04-25 ENCOUNTER — Ambulatory Visit: Payer: Medicaid Other | Admitting: Family

## 2015-08-02 ENCOUNTER — Other Ambulatory Visit: Payer: Self-pay

## 2015-08-02 MED ORDER — VITAMIN D (ERGOCALCIFEROL) 1.25 MG (50000 UNIT) PO CAPS: 50000.0000 [IU] | ORAL_CAPSULE | ORAL | 0 refills | Status: DC

## 2015-09-22 ENCOUNTER — Encounter: Payer: Self-pay | Admitting: Family

## 2015-09-22 ENCOUNTER — Ambulatory Visit (INDEPENDENT_AMBULATORY_CARE_PROVIDER_SITE_OTHER): Payer: Medicaid Other | Admitting: Family

## 2015-09-22 VITALS — BP 122/77 | HR 79 | Temp 97.6°F | Ht <= 58 in | Wt 253.8 lb

## 2015-09-22 DIAGNOSIS — E890 Postprocedural hypothyroidism: Secondary | ICD-10-CM

## 2015-09-22 DIAGNOSIS — Z6841 Body Mass Index (BMI) 40.0 and over, adult: Secondary | ICD-10-CM

## 2015-09-22 DIAGNOSIS — E0800 Diabetes mellitus due to underlying condition with hyperosmolarity without nonketotic hyperglycemic-hyperosmolar coma (NKHHC): Secondary | ICD-10-CM | POA: Diagnosis not present

## 2015-09-22 DIAGNOSIS — E559 Vitamin D deficiency, unspecified: Secondary | ICD-10-CM | POA: Diagnosis not present

## 2015-09-22 DIAGNOSIS — Q909 Down syndrome, unspecified: Secondary | ICD-10-CM | POA: Diagnosis not present

## 2015-09-22 DIAGNOSIS — Z23 Encounter for immunization: Secondary | ICD-10-CM

## 2015-09-22 LAB — BAYER DCA HB A1C WAIVED: HB A1C (BAYER DCA - WAIVED): 6.4 % (ref <7.0)

## 2015-09-22 NOTE — Progress Notes (Signed)
Subjective:    Patient ID: Julie Carter, female    DOB: 1985/11/15, 30 y.o.   MRN: [id number]  Pt presents for a chronic follow up with lab work. Pt has down syndrome and her mother is present. PT is followed by Endocrinologists once a year for hypothyroidism.  Diabetes  She presents for her follow-up diabetic visit. She has type 2 diabetes mellitus. Her disease course has been stable. Pertinent negatives for hypoglycemia include no confusion, dizziness, headaches or nervousness/anxiousness. Pertinent negatives for diabetes include no blurred vision, no fatigue, no foot paresthesias, no foot ulcerations and no visual change. Pertinent negatives for hypoglycemia complications include no blackouts and no hospitalization. Symptoms are stable. Pertinent negatives for diabetic complications include no CVA, heart disease, nephropathy or peripheral neuropathy. Risk factors for coronary artery disease include obesity. Current diabetic treatment includes oral agent (monotherapy). She is compliant with treatment all of the time. Her weight is increasing steadily. She is following a generally unhealthy diet. She rarely participates in exercise. Her breakfast blood glucose range is generally 110-130 mg/dl. Eye exam is current.  Thyroid Problem  Presents for follow-up visit. Symptoms include dry skin. Patient reports no anxiety, constipation, depressed mood, diarrhea, fatigue, heat intolerance, nail problem, palpitations or visual change. The symptoms have been stable. Past treatments include levothyroxine. The treatment provided significant relief. Her past medical history is significant for diabetes and obesity. There is no history of hyperlipidemia.     Review of Systems  Constitutional: Negative.  Negative for fatigue.  HENT: Negative.   Eyes: Negative.  Negative for blurred vision.  Respiratory: Negative.  Negative for shortness of breath.   Cardiovascular: Negative.  Negative for palpitations.    Gastrointestinal: Negative.  Negative for constipation and diarrhea.  Endocrine: Negative.  Negative for heat intolerance.  Genitourinary: Negative.   Musculoskeletal: Negative.   Neurological: Negative.  Negative for dizziness and headaches.  Hematological: Negative.   Psychiatric/Behavioral: Negative.  Negative for confusion. The patient is not nervous/anxious.   All other systems reviewed and are negative.      Objective:   Physical Exam  Constitutional: She is oriented to person, place, and time. She appears well-developed and well-nourished. No distress.  HENT:  Head: Normocephalic and atraumatic.  Right Ear: External ear normal.  Left Ear: External ear normal.  Nose: Nose normal.  Mouth/Throat: Oropharynx is clear and moist.  Eyes: Pupils are equal, round, and reactive to light.  Neck: Normal range of motion. Neck supple. No thyromegaly present.  Cardiovascular: Normal rate, regular rhythm, normal heart sounds and intact distal pulses.   No murmur heard. Pulmonary/Chest: Effort normal and breath sounds normal. No respiratory distress. She has no wheezes.  Abdominal: Soft. Bowel sounds are normal. She exhibits no distension. There is no tenderness.  Musculoskeletal: Normal range of motion. She exhibits no edema or tenderness.  Neurological: She is alert and oriented to person, place, and time. She has normal reflexes. No cranial nerve deficit.  Skin: Skin is warm and dry.  Psychiatric: She has a normal mood and affect. Her behavior is normal. Judgment and thought content normal.  Vitals reviewed.   BP 122/77   Pulse 79   Temp 97.6 F (36.4 C) (Oral)   Ht 4' 8.5" (1.435 m)   Wt 253 lb 12.8 oz (115.1 kg)   BMI 55.90 kg/m        Assessment & Plan:  1. Diabetes mellitus due to underlying condition with hyperosmolarity without coma, without long-term current use of insulin (  [place]) -Discussed wight loss and exercising - CMP14+EGFR - Bayer DCA Hb A1c Waived  2.  Hypothyroidism, postradioiodine therapy - CMP14+EGFR  3. Down syndrome - CMP14+EGFR  4. Morbid obesity with BMI of 50.0-59.9, adult (HCC) -Discussed diet and exercise  - CMP14+EGFR  5. Vitamin D deficiency - CMP14+EGFR - VITAMIN D 25 Hydroxy (Vit-D Deficiency, Fractures)   Continue all meds Labs pending Health Maintenance reviewed Diet and exercise encouraged RTO 6 months  Evelina Dun, FNP

## 2015-09-22 NOTE — Patient Instructions (Signed)

## 2015-09-23 LAB — CMP14+EGFR
A/G RATIO: 1.5 (ref 1.2–2.2)
ALBUMIN: 4.1 g/dL (ref 3.5–5.5)
ALT: 61 IU/L — ABNORMAL HIGH (ref 0–32)
AST: 35 IU/L (ref 0–40)
Alkaline Phosphatase: 78 IU/L (ref 39–117)
BILIRUBIN TOTAL: 0.3 mg/dL (ref 0.0–1.2)
BUN / CREAT RATIO: 12 (ref 9–23)
BUN: 10 mg/dL (ref 6–20)
CHLORIDE: 100 mmol/L (ref 96–106)
CO2: 26 mmol/L (ref 18–29)
Calcium: 9.5 mg/dL (ref 8.7–10.2)
Creatinine, Ser: 0.82 mg/dL (ref 0.57–1.00)
GFR calc non Af Amer: 96 mL/min/{1.73_m2} (ref >59)
GFR, EST AFRICAN AMERICAN: 110 mL/min/{1.73_m2} (ref >59)
GLOBULIN, TOTAL: 2.8 g/dL (ref 1.5–4.5)
Glucose: 160 mg/dL — ABNORMAL HIGH (ref 65–99)
POTASSIUM: 4.4 mmol/L (ref 3.5–5.2)
Sodium: 142 mmol/L (ref 134–144)
TOTAL PROTEIN: 6.9 g/dL (ref 6.0–8.5)

## 2015-09-23 LAB — VITAMIN D 25 HYDROXY (VIT D DEFICIENCY, FRACTURES): Vit D, 25-Hydroxy: 36.7 ng/mL (ref 30.0–100.0)

## 2015-09-27 ENCOUNTER — Other Ambulatory Visit: Payer: Self-pay | Admitting: *Deleted

## 2015-09-27 MED ORDER — LEVOTHYROXINE SODIUM 137 MCG PO TABS: ORAL_TABLET | ORAL | 2 refills | Status: DC

## 2015-11-02 ENCOUNTER — Ambulatory Visit: Payer: Medicaid Other | Admitting: Endocrinology

## 2015-11-02 DIAGNOSIS — Z0289 Encounter for other administrative examinations: Secondary | ICD-10-CM

## 2015-11-09 ENCOUNTER — Other Ambulatory Visit: Payer: Self-pay | Admitting: *Deleted

## 2015-11-09 MED ORDER — VITAMIN D (ERGOCALCIFEROL) 1.25 MG (50000 UNIT) PO CAPS: 50000.0000 [IU] | ORAL_CAPSULE | ORAL | 0 refills | Status: DC

## 2016-01-16 ENCOUNTER — Telehealth: Payer: Self-pay | Admitting: Endocrinology

## 2016-01-16 NOTE — Telephone Encounter (Signed)
patient wants a refill on levothyroxine but has nit been seen in a year and has no future appointment please advise

## 2016-01-16 NOTE — Telephone Encounter (Signed)
Giving only 30 day prescription with need to make a follow-up appointment

## 2016-01-17 ENCOUNTER — Other Ambulatory Visit: Payer: Self-pay

## 2016-01-17 MED ORDER — LEVOTHYROXINE SODIUM 137 MCG PO TABS: ORAL_TABLET | ORAL | 0 refills | Status: DC

## 2016-01-17 NOTE — Telephone Encounter (Signed)
Ordered

## 2016-02-20 ENCOUNTER — Other Ambulatory Visit: Payer: Self-pay | Admitting: Endocrinology

## 2016-02-20 ENCOUNTER — Other Ambulatory Visit: Payer: Self-pay | Admitting: Family

## 2016-02-20 DIAGNOSIS — E0800 Diabetes mellitus due to underlying condition with hyperosmolarity without nonketotic hyperglycemic-hyperosmolar coma (NKHHC): Secondary | ICD-10-CM

## 2016-02-21 ENCOUNTER — Other Ambulatory Visit (INDEPENDENT_AMBULATORY_CARE_PROVIDER_SITE_OTHER): Payer: Medicaid Other

## 2016-02-21 DIAGNOSIS — E890 Postprocedural hypothyroidism: Secondary | ICD-10-CM | POA: Diagnosis not present

## 2016-02-21 LAB — T4, FREE: FREE T4: 1.19 ng/dL (ref 0.60–1.60)

## 2016-02-21 LAB — TSH: TSH: 7.28 u[IU]/mL — ABNORMAL HIGH (ref 0.35–4.50)

## 2016-02-22 ENCOUNTER — Encounter: Payer: Self-pay | Admitting: Endocrinology

## 2016-02-22 ENCOUNTER — Ambulatory Visit (INDEPENDENT_AMBULATORY_CARE_PROVIDER_SITE_OTHER): Payer: Medicaid Other | Admitting: Endocrinology

## 2016-02-22 VITALS — BP 130/80 | HR 90 | Ht <= 58 in | Wt 251.0 lb

## 2016-02-22 DIAGNOSIS — E890 Postprocedural hypothyroidism: Secondary | ICD-10-CM

## 2016-02-22 DIAGNOSIS — L680 Hirsutism: Secondary | ICD-10-CM

## 2016-02-22 MED ORDER — LEVOTHYROXINE SODIUM 175 MCG PO TABS: ORAL_TABLET | ORAL | 3 refills | Status: DC

## 2016-02-22 NOTE — Progress Notes (Signed)
Patient ID: Julie Carter, female   DOB: Apr 29, 1985, 31 y.o.   MRN: [id number]  Reason for Appointment:  Hypothyroidism, followup visit    History of Present Illness:   The hypothyroidism was first diagnosed in  2013 after her I-131 treatment for Graves' disease in 08/2011  She did need an increase in her thyroid dosage done after initial therapy was started Difficult to assess her symptoms because of her mental retardation She has difficulty with her obesity  Currently the patient does not complain of unusual fatigue or cold intolerance Weight is about the same  Her dose was not changed on her visit over a year ago  However her TSH was high when she was hospitalized in 1/17, subsequently was back to normal without change in dosage         Compliance with the medical regimen has been as prescribed with taking the tablet in the morning before breakfast. Not on any iron or vitamins  Her TSH is relatively higher at 7.3   Wt Readings from Last 3 Encounters:  02/22/16 251 lb (113.9 kg)  09/22/15 253 lb 12.8 oz (115.1 kg)  03/22/15 258 lb (117 kg)     Lab Results  Component Value Date   FREET4 1.19 02/21/2016   FREET4 1.13 10/20/2014   FREET4 1.42 10/13/2013   TSH 7.28 (H) 02/21/2016   TSH 3.390 03/22/2015   TSH 10.890 (H) 01/14/2015   Allergies as of 02/22/2016   No Known Allergies     Medication List       Accurate as of 02/22/16  1:22 PM. Always use your most recent med list.          albuterol 108 (90 Base) MCG/ACT inhaler Commonly known as:  PROVENTIL HFA;VENTOLIN HFA Inhale 2 puffs into the lungs every 6 (six) hours as needed for wheezing or shortness of breath.   glucose blood test strip Use as instructed   levothyroxine 175 MCG tablet Commonly known as:  SYNTHROID, LEVOTHROID TAKE ONE TABLET BY MOUTH EVERYDAY   metFORMIN 1000 MG tablet Commonly known as:  GLUCOPHAGE TAKE ONE TABLET BY MOUTH TWICE DAILY WITH A MEAL   Vitamin D (Ergocalciferol) 50000  units Caps capsule Commonly known as:  DRISDOL TAKE 1 CAPSULE BY MOUTH EVERY 7 DAYS       Past Medical History:  Diagnosis Date  . Cancer ([place])   . Congenital nystagmus   . Diabetes mellitus   . Down's syndrome   . Heart murmur   . Leukemia [facility])     Past Surgical History:  Procedure Laterality Date  . HERNIA REPAIR     umbilical    Family History  Problem Relation Age of Onset  . Diabetes Mother   . Hypertension Mother     Social History:  reports that she has never smoked. She has never used smokeless tobacco. She reports that she does not drink alcohol or use drugs.  Allergies: No Known Allergies  REVIEW of systems:  She has has mild diabetes followed by PCP, last A1c was 6.4% She is however taking 1 g  metformin twice a day   She is not motivated to any exercise  She has had Oligomenorrhea for several years of unknown etiology, may have a cycle once a year or so   Examination:   BP 130/80   Pulse 90   Ht 4\' 9"  (1.448 m)   Wt 251 lb (113.9 kg)   SpO2 96%   BMI 54.32 kg/m  She has generalized obesity, no cushingoid features Does have facial hirsutism Has malar reddish rash Mild acanthosis present  Biceps reflexes appear normal No edema      Assessments   Hypothyroidism, post ablative with previously consistently  normal TSH and no clinical symptoms of hypothyroidism She has been very compliant with her thyroid supplement with the help of her mother Although she is asymptomatic now her TSH is higher than usual at 7.3 despite good compliance with her regimen She is currently taking the equivalent of about 156 g daily on an average  DIABETES: Last A1c 6.4 She should continue her metformin 1 g twice a day   Probable PCOS: She has amenorrhea and hirsutism and should have evaluation of her free testosterone Currently on 1 g twice a day of metformin   Plan:  Change doses to 175 g daily Follow-up in 3 months  Will check free testosterone on  the next visit If her testosterone level is still high may consider adding control pills  Julie Carter 02/22/2016, 1:22 PM   Note: This office note was prepared with Estate agent. Any transcriptional errors that result from this process are unintentional.

## 2016-03-16 ENCOUNTER — Other Ambulatory Visit: Payer: Self-pay | Admitting: Family

## 2016-03-16 DIAGNOSIS — E0800 Diabetes mellitus due to underlying condition with hyperosmolarity without nonketotic hyperglycemic-hyperosmolar coma (NKHHC): Secondary | ICD-10-CM

## 2016-03-22 ENCOUNTER — Ambulatory Visit: Payer: Medicaid Other | Admitting: Family

## 2016-05-21 ENCOUNTER — Other Ambulatory Visit: Payer: Self-pay | Admitting: Family

## 2016-05-22 ENCOUNTER — Other Ambulatory Visit: Payer: Self-pay | Admitting: Endocrinology

## 2016-05-22 ENCOUNTER — Ambulatory Visit: Payer: Medicaid Other | Admitting: Endocrinology

## 2016-05-22 DIAGNOSIS — Z0289 Encounter for other administrative examinations: Secondary | ICD-10-CM

## 2016-08-31 ENCOUNTER — Other Ambulatory Visit: Payer: Self-pay | Admitting: Family

## 2016-09-04 NOTE — Telephone Encounter (Signed)
Last Vit D   02/02/14  12.6

## 2016-10-22 ENCOUNTER — Telehealth: Payer: Self-pay | Admitting: Endocrinology

## 2016-10-22 NOTE — Telephone Encounter (Signed)
Only 30 tablets with no refill, note to make appointment, she was supposed to be seen in 5/18

## 2016-10-22 NOTE — Telephone Encounter (Signed)
Please advise if okay to refill? Patient has not been seen since 02/2016

## 2016-10-23 NOTE — Telephone Encounter (Signed)
I have refilled this prescription per Dr. Dwyane Dee.  Please schedule this patient for follow up with Dr. Dwyane Dee and labs if they need them. Thanks!

## 2016-11-07 ENCOUNTER — Other Ambulatory Visit: Payer: Self-pay | Admitting: Family

## 2016-11-07 DIAGNOSIS — E0800 Diabetes mellitus due to underlying condition with hyperosmolarity without nonketotic hyperglycemic-hyperosmolar coma (NKHHC): Secondary | ICD-10-CM

## 2016-11-08 NOTE — Telephone Encounter (Signed)
Last seen 09/21/16  [facility]

## 2016-11-15 ENCOUNTER — Ambulatory Visit: Payer: Self-pay | Admitting: Endocrinology

## 2016-11-20 ENCOUNTER — Telehealth: Payer: Self-pay | Admitting: Endocrinology

## 2016-11-20 NOTE — Telephone Encounter (Signed)
Patient need lab order sen to [facility]. So patient can get her labs drawn. F# [phone] Patient need a refill of levothyroxine.

## 2016-11-20 NOTE — Telephone Encounter (Signed)
I faxed this today. 

## 2016-11-21 ENCOUNTER — Other Ambulatory Visit: Payer: Self-pay | Admitting: Endocrinology

## 2016-11-21 ENCOUNTER — Other Ambulatory Visit: Payer: Self-pay | Admitting: Family

## 2016-11-21 NOTE — Telephone Encounter (Signed)
Last vitamin d level drawn 09/22/2015

## 2016-12-06 ENCOUNTER — Ambulatory Visit (INDEPENDENT_AMBULATORY_CARE_PROVIDER_SITE_OTHER): Payer: Medicare Other | Admitting: Endocrinology

## 2016-12-06 ENCOUNTER — Encounter: Payer: Self-pay | Admitting: Endocrinology

## 2016-12-06 VITALS — BP 116/74 | HR 80 | Ht <= 58 in | Wt 257.8 lb

## 2016-12-06 DIAGNOSIS — E1165 Type 2 diabetes mellitus with hyperglycemia: Secondary | ICD-10-CM

## 2016-12-06 DIAGNOSIS — L680 Hirsutism: Secondary | ICD-10-CM

## 2016-12-06 DIAGNOSIS — E890 Postprocedural hypothyroidism: Secondary | ICD-10-CM

## 2016-12-06 LAB — BASIC METABOLIC PANEL
BUN: 8 mg/dL (ref 6–23)
CHLORIDE: 100 meq/L (ref 96–112)
CO2: 31 mEq/L (ref 19–32)
CREATININE: 0.76 mg/dL (ref 0.40–1.20)
Calcium: 9.4 mg/dL (ref 8.4–10.5)
GFR: 93.23 mL/min (ref >60.00)
Glucose, Bld: 195 mg/dL — ABNORMAL HIGH (ref 70–99)
Potassium: 4.1 mEq/L (ref 3.5–5.1)
Sodium: 138 mEq/L (ref 135–145)

## 2016-12-06 LAB — T4, FREE: FREE T4: 1.06 ng/dL (ref 0.60–1.60)

## 2016-12-06 LAB — TSH: TSH: 2.69 u[IU]/mL (ref 0.35–4.50)

## 2016-12-06 LAB — HEMOGLOBIN A1C: Hgb A1c MFr Bld: 9.3 % — ABNORMAL HIGH (ref 4.6–6.5)

## 2016-12-06 NOTE — Progress Notes (Signed)
Patient ID: Julie Carter, female   DOB: September 13, 1985, 31 y.o.   MRN: [id number]  Reason for Appointment:  Hypothyroidism, followup visit    History of Present Illness:   The hypothyroidism was first diagnosed in  2013 after her I-131 treatment for Graves' disease in 08/2011  She did need an increase in her thyroid dosage done after initial therapy was started Difficult to assess her symptoms because of her mental retardation She has difficulty with her obesity  Currently the patient does not complain of unusual fatigue or cold intolerance         Compliance with the medical regimen has been as prescribed with taking the tablet in the morning before breakfast. Not on any iron or vitamins  Her TSH previously was relatively higher at 7.3, she was changed to the 175 g daily Her mother does not think she feels any better with this Has gained further weight No recent labs available and she did not come back for follow-up in June as instructed   Wt Readings from Last 3 Encounters:  12/06/16 257 lb 12.8 oz (116.9 kg)  02/22/16 251 lb (113.9 kg)  09/22/15 253 lb 12.8 oz (115.1 kg)    Lab Results  Component Value Date   TSH 7.28 (H) 02/21/2016   TSH 3.390 03/22/2015   TSH 10.890 (H) 01/14/2015   FREET4 1.19 02/21/2016   FREET4 1.13 10/20/2014   FREET4 1.42 10/13/2013    Allergies as of 12/06/2016   No Known Allergies     Medication List        Accurate as of 12/06/16 11:55 AM. Always use your most recent med list.          albuterol 108 (90 Base) MCG/ACT inhaler Commonly known as:  PROVENTIL HFA;VENTOLIN HFA Inhale 2 puffs into the lungs every 6 (six) hours as needed for wheezing or shortness of breath.   glucose blood test strip Use as instructed   levothyroxine 175 MCG tablet Commonly known as:  SYNTHROID, LEVOTHROID TAKE ONE TABLET BY MOUTH EVERY DAY. NEEDS APPOINTMENT FOR REFILLS.   metFORMIN 1000 MG tablet Commonly known as:  GLUCOPHAGE TAKE ONE  TABLET BY MOUTH TWICE DAILY WITH MEALS   Vitamin D (Ergocalciferol) 50000 units Caps capsule Commonly known as:  DRISDOL TAKE 1 CAPSULE BY MOUTH EVERY 7 DAYS       Past Medical History:  Diagnosis Date  . Cancer ([place])   . Congenital nystagmus   . Diabetes mellitus   . Down's syndrome   . Heart murmur   . Leukemia [facility])     Past Surgical History:  Procedure Laterality Date  . HERNIA REPAIR     umbilical    Family History  Problem Relation Age of Onset  . Diabetes Mother   . Hypertension Mother     Social History:  reports that  has never smoked. she has never used smokeless tobacco. She reports that she does not drink alcohol or use drugs.  Allergies: No Known Allergies  REVIEW of systems:  She has has diabetes followed by PCP  PC 180, fasting 140 at home according to her mother  She is only taking 1 g  metformin twice a day   She is not motivated to any exercise  She has had Oligomenorrhea for several years of unknown etiology, may have a cycle once a year or so   Examination:   BP 116/74   Pulse 80   Ht 4\' 9"  (1.448 m)  Wt 257 lb 12.8 oz (116.9 kg)   SpO2 94%   BMI 55.79 kg/m   She has generalized obesity, no cushingoid features Mild acanthosis present  Biceps reflexes appear normal No peripheral edema      Assessments   Hypothyroidism, post ablative with previously consistently  normal TSH and no clinical symptoms of hypothyroidism She has been very compliant with her thyroid supplement with the help of her mother  She needs follow-up after her dose was increased in February to the 175 g regimen Usually is asymptomatic even when her TSH is high   DIABETES: Her mother does not know what her A1c has been and will need to do it in follow-up today  Probable PCOS: She has amenorrhea and hirsutism and should have evaluation of her free testosterone Currently on 1 g twice a day of metformin   Plan:  Check labs today and discuss further  treatment as needed  [facility] 12/06/2016, 11:55 AM   Note: This office note was prepared with Dragon voice recognition system technology. Any transcriptional errors that result from this process are unintentional.

## 2016-12-10 LAB — TESTOSTERONE, TOTAL, LC/MS/MS: TESTOSTERONE, TOTAL: 20.9 ng/dL (ref 10.0–55.0)

## 2016-12-13 ENCOUNTER — Other Ambulatory Visit: Payer: Self-pay

## 2016-12-13 MED ORDER — EMPAGLIFLOZIN 10 MG PO TABS: ORAL_TABLET | ORAL | 3 refills | Status: DC

## 2016-12-19 ENCOUNTER — Other Ambulatory Visit: Payer: Self-pay | Admitting: Endocrinology

## 2017-01-28 ENCOUNTER — Ambulatory Visit: Payer: Medicaid Other | Admitting: Endocrinology

## 2017-01-28 ENCOUNTER — Other Ambulatory Visit: Payer: Self-pay | Admitting: Endocrinology

## 2017-01-28 ENCOUNTER — Encounter: Payer: Self-pay | Admitting: Endocrinology

## 2017-01-28 VITALS — BP 131/96 | HR 89 | Ht <= 58 in | Wt 254.6 lb

## 2017-01-28 DIAGNOSIS — E0800 Diabetes mellitus due to underlying condition with hyperosmolarity without nonketotic hyperglycemic-hyperosmolar coma (NKHHC): Secondary | ICD-10-CM

## 2017-01-28 LAB — BASIC METABOLIC PANEL
BUN: 11 mg/dL (ref 6–23)
CO2: 30 mEq/L (ref 19–32)
CREATININE: 0.77 mg/dL (ref 0.40–1.20)
Calcium: 8.9 mg/dL (ref 8.4–10.5)
Chloride: 103 mEq/L (ref 96–112)
GFR: 91.75 mL/min (ref >60.00)
GLUCOSE: 191 mg/dL — AB (ref 70–99)
Potassium: 4.4 mEq/L (ref 3.5–5.1)
Sodium: 141 mEq/L (ref 135–145)

## 2017-01-28 LAB — URINALYSIS, ROUTINE W REFLEX MICROSCOPIC
Bilirubin Urine: NEGATIVE
Hgb urine dipstick: NEGATIVE
Ketones, ur: NEGATIVE
Leukocytes, UA: NEGATIVE
Nitrite: NEGATIVE
PH: 6 (ref 5.0–8.0)
RBC / HPF: NONE SEEN (ref 0–?)
SPECIFIC GRAVITY, URINE: 1.025 (ref 1.000–1.030)
Total Protein, Urine: NEGATIVE
Urine Glucose: >=1000 — AB
Urobilinogen, UA: 0.2 (ref 0.0–1.0)
WBC, UA: NONE SEEN (ref 0–?)

## 2017-01-28 LAB — MICROALBUMIN / CREATININE URINE RATIO
Creatinine,U: 98.9 mg/dL
Microalb Creat Ratio: 0.7 mg/g (ref 0.0–30.0)
Microalb, Ur: <0.7 mg/dL (ref 0.0–1.9)

## 2017-01-28 LAB — POCT GLUCOSE (DEVICE FOR HOME USE): POC Glucose: 194 mg/dl — AB (ref 70–99)

## 2017-01-28 MED ORDER — EMPAGLIFLOZIN 25 MG PO TABS: 25.0000 mg | ORAL_TABLET | Freq: Every day | ORAL | 2 refills | Status: DC

## 2017-01-28 NOTE — Patient Instructions (Addendum)
Jardiance 25mg  daily  Check blood sugars on waking up 3/7   Also check blood sugars about 2 hours after a meal and do this after different meals by rotation  Recommended blood sugar levels on waking up is 90-130 and about 2 hours after meal is 130-160  Please bring your blood sugar monitor to each visit, thank you  Walk daily

## 2017-01-28 NOTE — Progress Notes (Signed)
Patient ID: Julie Carter, female   DOB: May 28, 1985, 32 y.o.   MRN: [id number]    Reason for Appointment:  Endocrinology, followup visit    History of Present Illness:   DIABETES type II  She has had diabetes followed by PCP for the last few years   Most recent A1c was 9.3 done in 12/2016  Current management and problems identified:   She has been previously treated with metformin only, 2 g daily since at least 2016  Because of her A1c been persistently high and going up to 9.3% she was started on Jardiance in December 2018  Most likely she has not taken this for a complete month as she has just finished her first prescription last week but her mother has not filled the prescription this week yet  No side effects except feeling more thirsty  Her weight is down 4 pounds  Her mother checks her blood sugar only in the morning and she thinks they are running between 160-250 but not clear how often these are being checked, last reading was probably lower at 160  Blood sugar in the office today was 190 midday  Patient is avoiding drinks which sugar but not following any diet  Also not motivated to do any exercise as such     Lab Results  Component Value Date   HGBA1C 9.3 (H) 12/06/2016   HGBA1C 8.5 (H) 01/14/2015   HGBA1C 7.0 02/02/2014   Lab Results  Component Value Date   MICROALBUR 20 02/02/2014   LDLCALC 77 03/22/2015   CREATININE 0.76 12/06/2016    The following is a copy of the previous note:  HYPOTHYROIDISM  This was  first diagnosed in  2013 after her I-131 treatment for Graves' disease in 08/2011  She did need an increase in her thyroid dosage done after initial therapy was started Difficult to assess her symptoms because of her mental retardation She has difficulty with her obesity  Currently the patient does not complain of unusual fatigue or cold intolerance         Compliance with the medical regimen has been as prescribed with taking  the tablet in the morning before breakfast. Not on any iron or vitamins  Her TSH previously was relatively higher at 7.3, she was changed to the 175 g daily Her mother does not think she feels any better with this  Has gained further weight No recent labs available and she did not come back for follow-up in June as instructed   Wt Readings from Last 3 Encounters:  01/28/17 254 lb 9.6 oz (115.5 kg)  12/06/16 257 lb 12.8 oz (116.9 kg)  02/22/16 251 lb (113.9 kg)    Lab Results  Component Value Date   TSH 2.69 12/06/2016   TSH 7.28 (H) 02/21/2016   TSH 3.390 03/22/2015   FREET4 1.06 12/06/2016   FREET4 1.19 02/21/2016   FREET4 1.13 10/20/2014      Allergies as of 01/28/2017   No Known Allergies     Medication List        Accurate as of 01/28/17  1:06 PM. Always use your most recent med list.          empagliflozin 10 MG Tabs tablet Commonly known as:  JARDIANCE Take 1 tablet daily   glucose blood test strip Use as instructed   levothyroxine 175 MCG tablet Commonly known as:  SYNTHROID, LEVOTHROID TAKE ONE TABLET BY MOUTH EVERY DAY. NEEDS APPOINTMENT FOR MORE REFILLS.  metFORMIN 1000 MG tablet Commonly known as:  GLUCOPHAGE TAKE ONE TABLET BY MOUTH TWICE DAILY WITH MEALS   Vitamin D (Ergocalciferol) 50000 units Caps capsule Commonly known as:  DRISDOL TAKE 1 CAPSULE BY MOUTH EVERY 7 DAYS       Past Medical History:  Diagnosis Date  . Cancer ([place])   . Congenital nystagmus   . Diabetes mellitus   . Down's syndrome   . Heart murmur   . Leukemia [facility])     Past Surgical History:  Procedure Laterality Date  . HERNIA REPAIR     umbilical    Family History  Problem Relation Age of Onset  . Diabetes Mother   . Hypertension Mother     Social History:  reports that  has never smoked. she has never used smokeless tobacco. She reports that she does not drink alcohol or use drugs.  Allergies: No Known Allergies  REVIEW of systems:   She has had  Oligomenorrhea for several years of unknown etiology, may have a cycle once a year or so However her  testosterone was normal  BP Readings from Last 3 Encounters:  01/28/17 (!) 131/96  12/06/16 116/74  02/22/16 130/80      Examination:   BP (!) 131/96 (BP Location: Left Arm, Patient Position: Sitting, Cuff Size: Large)   Pulse 89   Ht 4\' 9"  (1.448 m)   Wt 254 lb 9.6 oz (115.5 kg)   BMI 55.09 kg/m      Assessments   Hypothyroidism, post ablative with last TSH normal   DIABETES:   She has marked obesity and progressively poor control on metformin only Last A1c 9.3 Not clear she is improving on Jardiance 10 mg but even without her medication for 3 days her glucose is below 200 today She does need weight loss Also needs to stop just monitoring fasting and with monitoring after meals she will have a better idea of how Efrain foods are affecting her blood sugar   Plan:  Emphasized to the patient and her daughter that she needs to really start an exercise program for weight loss She needs to see the dietitian for meal planning Given detailed instructions on checking blood sugars after meals and only occasionally fasting Increased Jardiance to 25 mg Check renal function and fructosamine today    Elayne Snare 01/28/2017, 1:06 PM   Note: This office note was prepared with Dragon voice recognition system technology. Any transcriptional errors that result from this process are unintentional.

## 2017-01-29 LAB — FRUCTOSAMINE: FRUCTOSAMINE: 244 umol/L (ref 0–285)

## 2017-03-04 ENCOUNTER — Other Ambulatory Visit: Payer: Self-pay | Admitting: Endocrinology

## 2017-03-25 ENCOUNTER — Encounter: Payer: Self-pay | Admitting: Endocrinology

## 2017-03-25 ENCOUNTER — Ambulatory Visit (INDEPENDENT_AMBULATORY_CARE_PROVIDER_SITE_OTHER): Payer: Medicare Other | Admitting: Endocrinology

## 2017-03-25 VITALS — BP 110/70 | HR 92 | Ht <= 58 in | Wt 245.0 lb

## 2017-03-25 DIAGNOSIS — E669 Obesity, unspecified: Secondary | ICD-10-CM

## 2017-03-25 DIAGNOSIS — E1169 Type 2 diabetes mellitus with other specified complication: Secondary | ICD-10-CM

## 2017-03-25 DIAGNOSIS — E1165 Type 2 diabetes mellitus with hyperglycemia: Secondary | ICD-10-CM | POA: Diagnosis not present

## 2017-03-25 DIAGNOSIS — E890 Postprocedural hypothyroidism: Secondary | ICD-10-CM | POA: Diagnosis not present

## 2017-03-25 LAB — LIPID PANEL
CHOLESTEROL: 138 mg/dL (ref 0–200)
HDL: 37.7 mg/dL — AB (ref >39.00)
LDL Cholesterol: 69 mg/dL (ref 0–99)
NONHDL: 99.8
Total CHOL/HDL Ratio: 4
Triglycerides: 153 mg/dL — ABNORMAL HIGH (ref 0.0–149.0)
VLDL: 30.6 mg/dL (ref 0.0–40.0)

## 2017-03-25 LAB — COMPREHENSIVE METABOLIC PANEL
ALT: 49 U/L — AB (ref 0–35)
AST: 35 U/L (ref 0–37)
Albumin: 3.6 g/dL (ref 3.5–5.2)
Alkaline Phosphatase: 79 U/L (ref 39–117)
BILIRUBIN TOTAL: 0.4 mg/dL (ref 0.2–1.2)
BUN: 10 mg/dL (ref 6–23)
CALCIUM: 9.3 mg/dL (ref 8.4–10.5)
CO2: 30 mEq/L (ref 19–32)
CREATININE: 0.85 mg/dL (ref 0.40–1.20)
Chloride: 102 mEq/L (ref 96–112)
GFR: 81.79 mL/min (ref >60.00)
Glucose, Bld: 216 mg/dL — ABNORMAL HIGH (ref 70–99)
Potassium: 4 mEq/L (ref 3.5–5.1)
Sodium: 138 mEq/L (ref 135–145)
TOTAL PROTEIN: 7.3 g/dL (ref 6.0–8.3)

## 2017-03-25 LAB — TSH: TSH: 1.53 u[IU]/mL (ref 0.35–4.50)

## 2017-03-25 LAB — POCT GLYCOSYLATED HEMOGLOBIN (HGB A1C): Hemoglobin A1C: 6.7

## 2017-03-25 LAB — T4, FREE: Free T4: 1.11 ng/dL (ref 0.60–1.60)

## 2017-03-25 MED ORDER — EMPAGLIFLOZIN-METFORMIN HCL ER 12.5-1000 MG PO TB24: 2.0000 | ORAL_TABLET | Freq: Every day | ORAL | 3 refills | Status: DC

## 2017-03-25 NOTE — Patient Instructions (Signed)
Check blood sugars on waking up  2/7  Also check blood sugars about 2 hours after a meal and do this after different meals by rotation  Recommended blood sugar levels on waking up is 90-130 and about 2 hours after meal is 130-160  Please bring your blood sugar monitor to each visit, thank you  

## 2017-03-25 NOTE — Progress Notes (Signed)
Patient ID: Julie Carter, female   DOB: [birth year], 32 y.o.   MRN: [id number]    Reason for Appointment:  Endocrinology, followup visit    History of Present Illness:   DIABETES type II  She has had diabetes followed by PCP for the last few years   Most recent A1c was 9.3 done in 12/2016 and is now 6.7  Current management and problems identified:   Jardiance was increased up to 25 mg from January visit onwards since she had inadequate control  Her mother does not know what glucose monitor she is using at home and does not know how often she is checking the readings  She claims that her blood sugars have been fairly good with highest reading about 182 after lunch but may be occasionally below 100 in the morning  Since her last visit patient has been somewhat better motivated to cut back on her portions and snacks and has better satiety even though her medications have not been changed significantly  Her mother now states that she tends to get diarrhea occasionally with the regular metformin that she is taking twice a day  She has started doing a little more walking than before  Weight has improved about 9 pounds and continues to improve slowly  Again usually trying to avoid high sugar content foods and drinks  Has not had a dietitian consultation     Wt Readings from Last 3 Encounters:  03/25/17 245 lb (111.1 kg)  01/28/17 254 lb 9.6 oz (115.5 kg)  12/06/16 257 lb 12.8 oz (116.9 kg)     Lab Results  Component Value Date   HGBA1C 6.7 03/25/2017   HGBA1C 9.3 (H) 12/06/2016   HGBA1C 8.5 (H) 01/14/2015   Lab Results  Component Value Date   MICROALBUR <0.7 01/28/2017   [place] 77 03/22/2015   CREATININE 0.77 01/28/2017     HYPOTHYROIDISM  This was  first diagnosed in  2013 after her I-131 treatment for Graves' disease in 08/2011  She did need an increase in her thyroid dosage done after initial therapy was started Difficult to assess her symptoms  because of her mental retardation  No recent symptoms of increased fatigue or cold intolerance         Compliance with the medical regimen has been as prescribed with taking the tablet in the morning before breakfast. Not on any iron or vitamins  Her TSH previously was relatively higher at 7.3, she was changed to the 175 g daily  Last TSH was normal in 12/18   Wt Readings from Last 3 Encounters:  03/25/17 245 lb (111.1 kg)  01/28/17 254 lb 9.6 oz (115.5 kg)  12/06/16 257 lb 12.8 oz (116.9 kg)    Lab Results  Component Value Date   TSH 2.69 12/06/2016   TSH 7.28 (H) 02/21/2016   TSH 3.390 03/22/2015   FREET4 1.06 12/06/2016   FREET4 1.19 02/21/2016   FREET4 1.13 10/20/2014      Allergies as of 03/25/2017   No Known Allergies     Medication List        Accurate as of 03/25/17  1:20 PM. Always use your most recent med list.          empagliflozin 25 MG Tabs tablet Commonly known as:  JARDIANCE Take 25 mg by mouth daily.   Empagliflozin-metFORMIN HCl ER 12.05-998 MG Tb24 Commonly known as:  SYNJARDY XR Take 2 tablets by mouth daily.   glucose blood test strip  Use as instructed   levothyroxine 175 MCG tablet Commonly known as:  SYNTHROID, LEVOTHROID TAKE ONE TABLET BY MOUTH EVERY DAY   metFORMIN 1000 MG tablet Commonly known as:  GLUCOPHAGE TAKE ONE TABLET BY MOUTH TWICE DAILY WITH MEALS   Vitamin D (Ergocalciferol) 50000 units Caps capsule Commonly known as:  DRISDOL TAKE 1 CAPSULE BY MOUTH EVERY 7 DAYS       Past Medical History:  Diagnosis Date  . Cancer ([place])   . Congenital nystagmus   . Diabetes mellitus   . Down's syndrome   . Heart murmur   . Leukemia [facility])     Past Surgical History:  Procedure Laterality Date  . HERNIA REPAIR     umbilical    Family History  Problem Relation Age of Onset  . Diabetes Mother   . Hypertension Mother     Social History:  reports that she has never smoked. She has never used smokeless tobacco. She  reports that she does not drink alcohol or use drugs.  Allergies: No Known Allergies  REVIEW of systems:   She has had Oligomenorrhea for several years of unknown etiology, may have a cycle once a year or so Free her  testosterone was normal  No history of hypertension: Pressure initially high on first measurement today  BP Readings from Last 3 Encounters:  03/25/17 110/70  01/28/17 (!) 131/96  12/06/16 116/74      Examination:   BP 110/70 (Cuff Size: Normal)   Pulse 92   Ht 4\' 9"  (1.448 m)   Wt 245 lb (111.1 kg)   SpO2 94%   BMI 53.02 kg/m      Assessments   Hypothyroidism, post ablative with last TSH normal Needs follow-up TSH today to make sure her dose is consistent especially with her losing weight since December  DIABETES:   She has marked obesity and now taking Jardiance 25 mg also along with metformin With this her A1c is much better at 6.7 compared to 9.3 Home monitoring is probably erratic and not clear what meter patient is using, this was not brought in again today Although her weight is coming down and her blood sugars reportedly are fairly good at home she does need to be seen by dietitian for meal planning and further improvement in lifestyle   Plan:  Since she is having some GI side effects with metformin will change her to Synjardy stop Jardiance separately More readings after meals Increased frequency of walking Labs today    Elayne Snare 03/25/2017, 1:20 PM   Note: This office note was prepared with Dragon voice recognition system technology. Any transcriptional errors that result from this process are unintentional.

## 2017-04-06 ENCOUNTER — Other Ambulatory Visit: Payer: Self-pay | Admitting: Endocrinology

## 2017-04-06 ENCOUNTER — Other Ambulatory Visit: Payer: Self-pay | Admitting: Family

## 2017-04-30 IMAGING — CR DG CHEST 2V
3 series · 3 of 3 positions shown · non-contrast
Comparison: PA and lateral chest x-ray January 11, 2015

CLINICAL DATA: Recent diagnosis of pneumonia, patient has
experience no clinical improvement.

EXAM:
CHEST  2 VIEW

[view not recorded (1 of 3)]
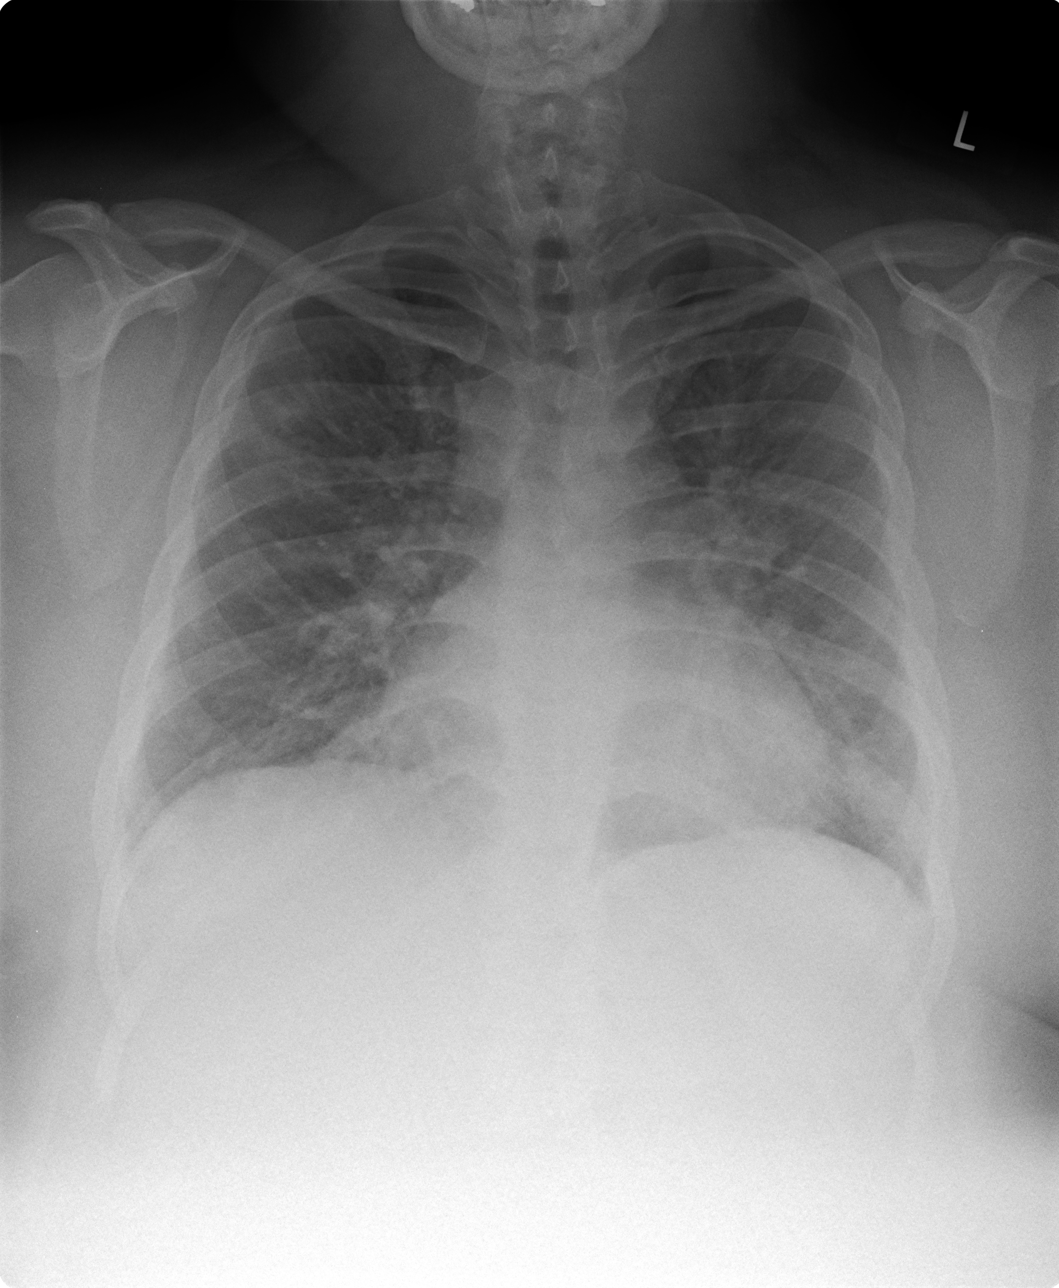

[view not recorded (2 of 3)]
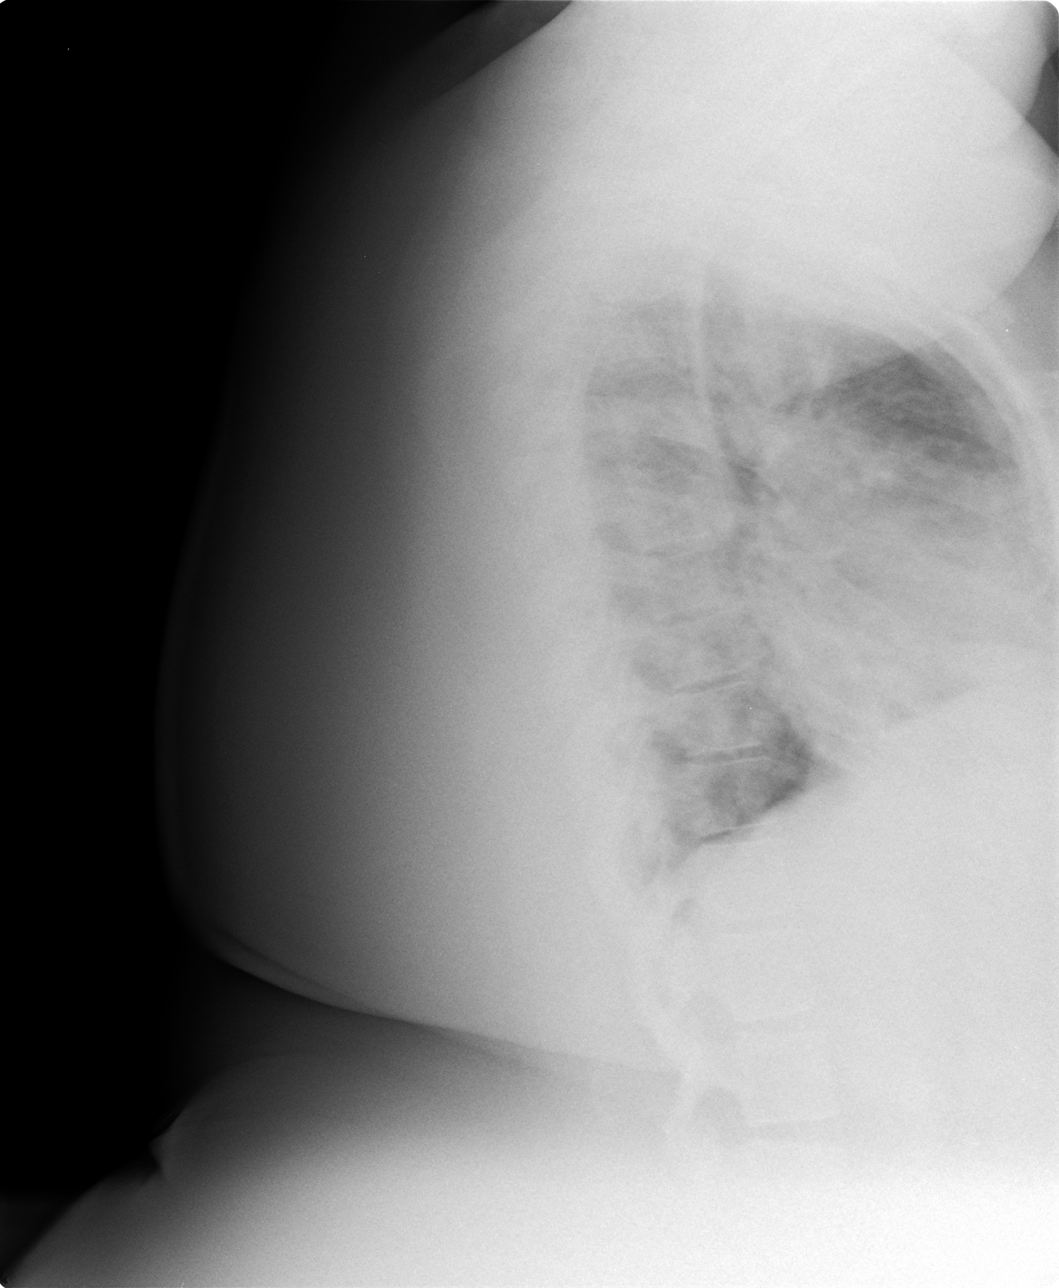

[view not recorded (3 of 3)]
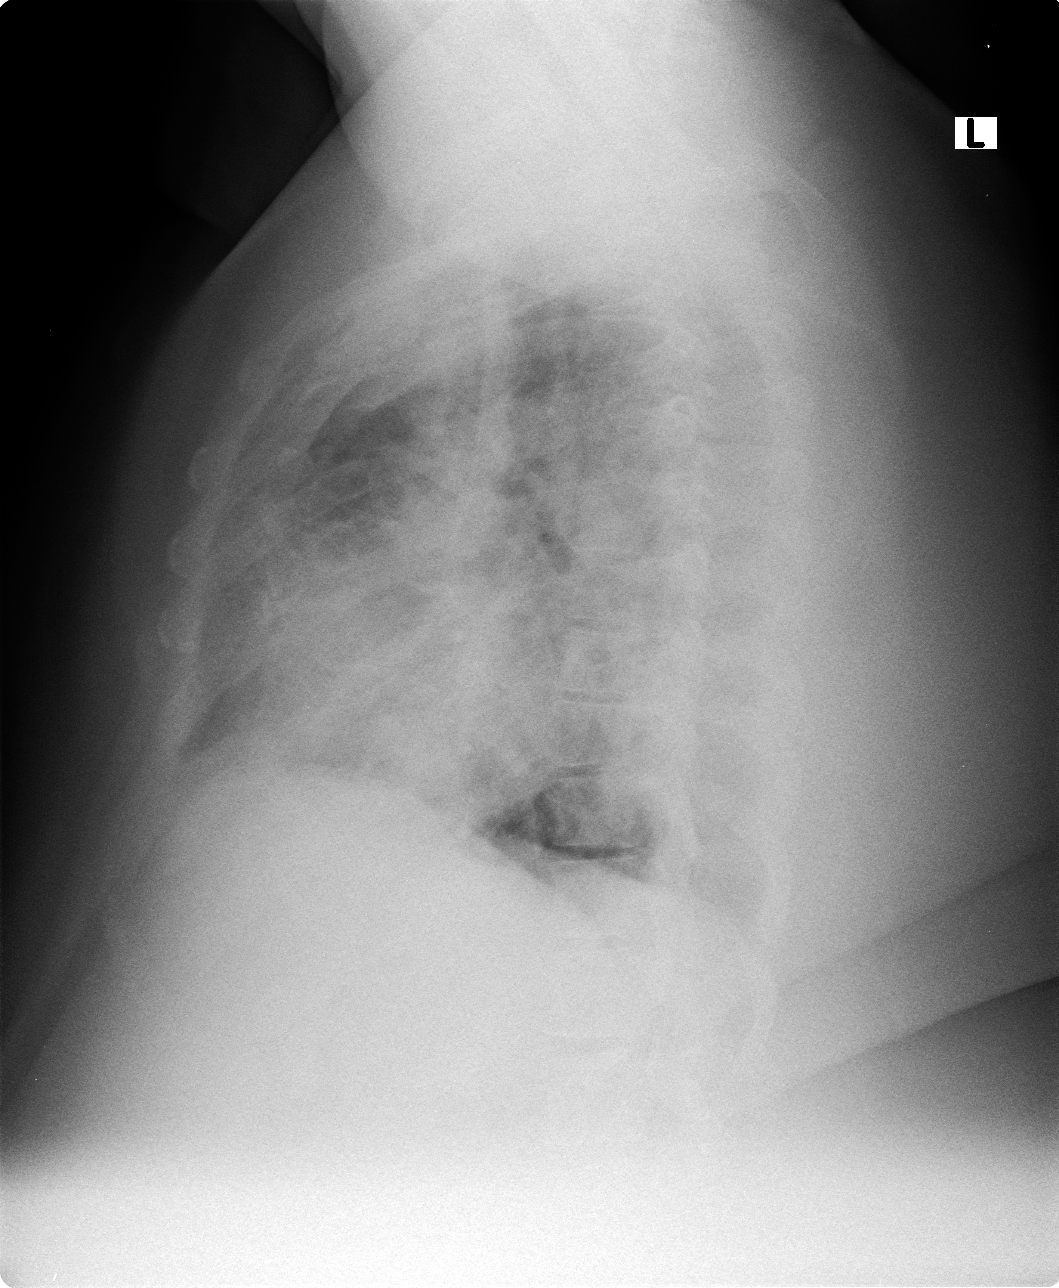

[3 of 3 positions shown; findings below may reference images not displayed]

FINDINGS: The study is limited due to scatter effects secondary to the
patient's body habitus. The lungs are adequately inflated. The
interstitial markings remain increased bilaterally. Confluent
alveolar opacity in the lingula has become less conspicuous. The
cardiac silhouette is top-normal to mildly enlarged. The central
pulmonary vascularity remains prominent.
IMPRESSION: 1. Mild interval improvement in alveolar infiltrate in the lingula.
Persistently increased interstitial markings in both lungs are
present. Additional follow-up radiographs following antibiotic
therapy will be needed.
2. The cardiac silhouette appears slightly larger today. The
pulmonary vascularity is slightly less distinct and appears mildly
engorged. Are there other clinical or laboratory findings that might
suggest low-grade CHF? This would be unusual at this age but
correlation clinically is needed.

## 2017-06-06 ENCOUNTER — Ambulatory Visit (INDEPENDENT_AMBULATORY_CARE_PROVIDER_SITE_OTHER): Payer: Medicare Other | Admitting: Endocrinology

## 2017-06-06 ENCOUNTER — Ambulatory Visit: Payer: Medicaid Other | Admitting: Endocrinology

## 2017-06-06 ENCOUNTER — Encounter: Payer: Self-pay | Admitting: Endocrinology

## 2017-06-06 VITALS — BP 124/70 | HR 98 | Ht <= 58 in | Wt 241.8 lb

## 2017-06-06 DIAGNOSIS — E669 Obesity, unspecified: Secondary | ICD-10-CM

## 2017-06-06 DIAGNOSIS — E890 Postprocedural hypothyroidism: Secondary | ICD-10-CM | POA: Diagnosis not present

## 2017-06-06 DIAGNOSIS — N914 Secondary oligomenorrhea: Secondary | ICD-10-CM

## 2017-06-06 DIAGNOSIS — E1169 Type 2 diabetes mellitus with other specified complication: Secondary | ICD-10-CM

## 2017-06-06 LAB — HEMOGLOBIN A1C: Hgb A1c MFr Bld: 6.8 % — ABNORMAL HIGH (ref 4.6–6.5)

## 2017-06-06 LAB — COMPREHENSIVE METABOLIC PANEL
ALT: 44 U/L — ABNORMAL HIGH (ref 0–35)
AST: 29 U/L (ref 0–37)
Albumin: 3.9 g/dL (ref 3.5–5.2)
Alkaline Phosphatase: 72 U/L (ref 39–117)
BUN: 13 mg/dL (ref 6–23)
CALCIUM: 9.6 mg/dL (ref 8.4–10.5)
CHLORIDE: 101 meq/L (ref 96–112)
CO2: 30 meq/L (ref 19–32)
CREATININE: 0.89 mg/dL (ref 0.40–1.20)
GFR: 77.46 mL/min (ref >60.00)
Glucose, Bld: 124 mg/dL — ABNORMAL HIGH (ref 70–99)
Potassium: 3.9 mEq/L (ref 3.5–5.1)
Sodium: 139 mEq/L (ref 135–145)
Total Bilirubin: 0.7 mg/dL (ref 0.2–1.2)
Total Protein: 7.1 g/dL (ref 6.0–8.3)

## 2017-06-06 LAB — GLUCOSE, POCT (MANUAL RESULT ENTRY): POC GLUCOSE: 133 mg/dL — AB (ref 70–99)

## 2017-06-06 LAB — T4, FREE: FREE T4: 1.4 ng/dL (ref 0.60–1.60)

## 2017-06-06 LAB — TSH: TSH: 0.23 u[IU]/mL — ABNORMAL LOW (ref 0.35–4.50)

## 2017-06-06 LAB — FOLLICLE STIMULATING HORMONE: FSH: 4.4 m[IU]/mL

## 2017-06-06 NOTE — Progress Notes (Signed)
Please call to let patient know that the thyroid test is slightly high, she can continue to take 175 levothyroxine but once a week will take only half a tablet.  A1c is 6.8

## 2017-06-06 NOTE — Progress Notes (Signed)
Patient ID: Julie Carter, female   DOB: 1985-08-04, 32 y.o.   MRN: [id number]    Reason for Appointment:  Endocrinology, followup visit    History of Present Illness:   DIABETES type II  She has had diabetes for several years, previously treated with metformin alone by PCP Jardiance was added for better control in 12/2016 when A1c was 9.3   Most recent A1c was 6.7 in March 2019  Current management and problems identified:   Vania Rea was increased up to 25 mg from January  She is not taking Synjardy  She has now lost about 13 pounds this year  Also she is reportedly a little more active with walking  Her mother thinks that she is not as hungry and watching portions and snacks  Her mother is checking blood sugars only sporadically in the morning and does not bring the monitor for download despite reminders  Home readings are about 120-130 usually  Has not had a dietitian consultation   Side effects from medications: Some diarrhea from regular Metformin  Wt Readings from Last 3 Encounters:  06/06/17 241 lb 12.8 oz (109.7 kg)  03/25/17 245 lb (111.1 kg)  01/28/17 254 lb 9.6 oz (115.5 kg)     Lab Results  Component Value Date   HGBA1C 6.7 03/25/2017   HGBA1C 9.3 (H) 12/06/2016   HGBA1C 8.5 (H) 01/14/2015   Lab Results  Component Value Date   MICROALBUR <0.7 01/28/2017   [place] 69 03/25/2017   CREATININE 0.85 03/25/2017     HYPOTHYROIDISM  This was  first diagnosed in  2013 after her I-131 treatment for Graves' disease in 08/2011  She did need an increase in her thyroid dosage done after initial therapy was started Difficult to assess her symptoms because of her mental retardation  No recent symptoms of increased fatigue or cold intolerance         Compliance with the medical regimen has been as prescribed with taking the tablet in the morning before breakfast. Not on any iron or vitamins  Her TSH previously was relatively higher at 7.3,  she was changed to the 175 g daily  Last TSH was normal in 3/19   Wt Readings from Last 3 Encounters:  06/06/17 241 lb 12.8 oz (109.7 kg)  03/25/17 245 lb (111.1 kg)  01/28/17 254 lb 9.6 oz (115.5 kg)    Lab Results  Component Value Date   TSH 1.53 03/25/2017   TSH 2.69 12/06/2016   TSH 7.28 (H) 02/21/2016   FREET4 1.11 03/25/2017   FREET4 1.06 12/06/2016   FREET4 1.19 02/21/2016      Allergies as of 06/06/2017   No Known Allergies     Medication List        Accurate as of 06/06/17 10:33 AM. Always use your most recent med list.          Empagliflozin-metFORMIN HCl ER 12.05-998 MG Tb24 Commonly known as:  SYNJARDY XR Take 2 tablets by mouth daily.   glucose blood test strip Use as instructed   levothyroxine 175 MCG tablet Commonly known as:  SYNTHROID, LEVOTHROID TAKE ONE TABLET BY MOUTH EVERY DAY   Vitamin D (Ergocalciferol) 50000 units Caps capsule Commonly known as:  DRISDOL TAKE 1 CAPSULE BY MOUTH EVERY 7 DAYS       Past Medical History:  Diagnosis Date  . Cancer ([place])   . Congenital nystagmus   . Diabetes mellitus   . Down's syndrome   . Heart  murmur   . Leukemia [facility])     Past Surgical History:  Procedure Laterality Date  . HERNIA REPAIR     umbilical    Family History  Problem Relation Age of Onset  . Diabetes Mother   . Hypertension Mother     Social History:  reports that she has never smoked. She has never used smokeless tobacco. She reports that she does not drink alcohol or use drugs.  Allergies: No Known Allergies  REVIEW of systems:   She has had Oligomenorrhea for several years of unknown etiology, may have a cycle once a year or so Previously total testosterone was normal, no recent prolactin level available  No history of hypertension: Blood pressure is better after continuing Jardiance  BP Readings from Last 3 Encounters:  06/06/17 124/70  03/25/17 110/70  01/28/17 (!) 131/96      Examination:   BP 124/70  (BP Location: Left Arm, Patient Position: Sitting, Cuff Size: Large)   Pulse 98   Ht 4\' 9"  (1.448 m)   Wt 241 lb 12.8 oz (109.7 kg)   SpO2 98%   BMI 52.33 kg/m   She has mild generalized obesity No significant acanthosis present Biceps reflexes difficult to elicit but appear normal    Assessments   Hypothyroidism, post ablative with last TSH normal Subjectively doing well Needs follow-up TSH today    DIABETES:   She has marked obesity and now much better controlled with adding Jardiance 25 mg also along with metformin With this her A1c is much better at 6.7 and will need follow-up level today She has had some continued weight loss Encouraged her to be more active also  OLIGOMENORRHEA: This continues despite some weight loss and improve diabetes control Will need to evaluate further with free testosterone, FSH and prolactin   Plan:  To check blood sugars after meals regularly also Follow-up in 4 months To follow-up with PCP for other issues including ruling out sleep apnea    Elayne Snare 06/06/2017, 10:33 AM   Note: This office note was prepared with Dragon voice recognition system technology. Any transcriptional errors that result from this process are unintentional.

## 2017-06-06 NOTE — Addendum Note (Signed)
Addended by: Kaylyn Lim I on: 06/06/2017 11:37 AM   Modules accepted: Orders

## 2017-06-06 NOTE — Patient Instructions (Signed)
Check blood sugars on waking up  1-2/7  Also check blood sugars about 2 hours after a meal and do this after different meals by rotation  Recommended blood sugar levels on waking up is 90-130 and about 2 hours after meal is 130-160  Please bring your blood sugar monitor to each visit, thank you

## 2017-06-08 LAB — TESTOSTERONE, FREE, TOTAL, SHBG
Sex Hormone Binding: 19.1 nmol/L — ABNORMAL LOW (ref 24.6–122.0)
TESTOSTERONE FREE: 4.5 pg/mL — AB (ref 0.0–4.2)
TESTOSTERONE: 46 ng/dL (ref 8–48)

## 2017-06-08 LAB — PROLACTIN: PROLACTIN: 10.9 ng/mL (ref 4.8–23.3)

## 2017-06-25 ENCOUNTER — Ambulatory Visit: Payer: Medicaid Other | Admitting: Endocrinology

## 2017-08-08 ENCOUNTER — Other Ambulatory Visit: Payer: Self-pay | Admitting: Endocrinology

## 2017-08-09 ENCOUNTER — Other Ambulatory Visit: Payer: Self-pay

## 2017-08-09 ENCOUNTER — Telehealth: Payer: Self-pay | Admitting: Endocrinology

## 2017-08-09 MED ORDER — EMPAGLIFLOZIN-METFORMIN HCL ER 12.5-1000 MG PO TB24: 2.0000 | ORAL_TABLET | Freq: Every day | ORAL | 0 refills | Status: DC

## 2017-08-09 MED ORDER — LEVOTHYROXINE SODIUM 175 MCG PO TABS: 175.0000 ug | ORAL_TABLET | Freq: Every day | ORAL | 1 refills | Status: DC

## 2017-08-09 NOTE — Telephone Encounter (Signed)
Rx sent 

## 2017-08-09 NOTE — Telephone Encounter (Signed)
Pt needs refill on Synjardy and Levothyroxine. Wants [facility] [place].

## 2017-10-07 ENCOUNTER — Ambulatory Visit: Payer: Medicare Other | Admitting: Endocrinology

## 2017-10-13 ENCOUNTER — Other Ambulatory Visit: Payer: Self-pay | Admitting: Endocrinology

## 2017-10-18 ENCOUNTER — Encounter: Payer: Self-pay | Admitting: Endocrinology

## 2017-10-18 ENCOUNTER — Ambulatory Visit (INDEPENDENT_AMBULATORY_CARE_PROVIDER_SITE_OTHER): Payer: Medicare Other | Admitting: Endocrinology

## 2017-10-18 VITALS — BP 124/76 | HR 93 | Ht <= 58 in | Wt 236.0 lb

## 2017-10-18 DIAGNOSIS — E890 Postprocedural hypothyroidism: Secondary | ICD-10-CM | POA: Diagnosis not present

## 2017-10-18 DIAGNOSIS — E669 Obesity, unspecified: Secondary | ICD-10-CM | POA: Diagnosis not present

## 2017-10-18 DIAGNOSIS — E782 Mixed hyperlipidemia: Secondary | ICD-10-CM

## 2017-10-18 DIAGNOSIS — E1169 Type 2 diabetes mellitus with other specified complication: Secondary | ICD-10-CM | POA: Diagnosis not present

## 2017-10-18 LAB — LIPID PANEL
Cholesterol: 162 mg/dL (ref 0–200)
HDL: 44.3 mg/dL (ref >39.00)
LDL Cholesterol: 94 mg/dL (ref 0–99)
NONHDL: 117.61
Total CHOL/HDL Ratio: 4
Triglycerides: 117 mg/dL (ref 0.0–149.0)
VLDL: 23.4 mg/dL (ref 0.0–40.0)

## 2017-10-18 LAB — COMPREHENSIVE METABOLIC PANEL
ALBUMIN: 4.1 g/dL (ref 3.5–5.2)
ALK PHOS: 75 U/L (ref 39–117)
ALT: 57 U/L — ABNORMAL HIGH (ref 0–35)
AST: 36 U/L (ref 0–37)
BUN: 11 mg/dL (ref 6–23)
CALCIUM: 9.9 mg/dL (ref 8.4–10.5)
CO2: 31 mEq/L (ref 19–32)
Chloride: 102 mEq/L (ref 96–112)
Creatinine, Ser: 0.81 mg/dL (ref 0.40–1.20)
GFR: 86.17 mL/min (ref >60.00)
GLUCOSE: 112 mg/dL — AB (ref 70–99)
POTASSIUM: 3.8 meq/L (ref 3.5–5.1)
Sodium: 140 mEq/L (ref 135–145)
TOTAL PROTEIN: 7.5 g/dL (ref 6.0–8.3)
Total Bilirubin: 0.7 mg/dL (ref 0.2–1.2)

## 2017-10-18 LAB — POCT GLYCOSYLATED HEMOGLOBIN (HGB A1C): Hemoglobin A1C: 6.1 % — AB (ref 4.0–5.6)

## 2017-10-18 LAB — T4, FREE: Free T4: 1.29 ng/dL (ref 0.60–1.60)

## 2017-10-18 LAB — TSH: TSH: 0.14 u[IU]/mL — AB (ref 0.35–4.50)

## 2017-10-18 NOTE — Progress Notes (Signed)
Patient ID: Julie Carter, female   DOB: 1985/12/14, 32 y.o.   MRN: [id number]    Reason for Appointment:  Endocrinology, followup visit    History of Present Illness:   DIABETES type II  She has had diabetes for several years, previously treated with metformin alone by PCP Jardiance was added for better control in 12/2016 when A1c was 9.3   Most recent A1c was 6.7 in March 2019  Current management, blood sugar results and problems identified:   Oral hypoglycemic drugs: Synjardy XR 12.05/998 mg, 2 tablets daily  A1c is now 6.1, previously 6.8   She continues to lose weight gradually and has lost another 5 pounds  Her mother thinks that she is able to control her portions better  Also is a little more active in general with some walking  She is using a generic monitor and the time is not programmed correctly with blood sugar range 109-187 with average about 124, has about 8 or 9 readings  No side effects with Jardiance  Has been compliant with the Synjardy and no difficulty with insurance coverage  Has not had a dietitian consultation despite reminders   Side effects from medications: Some diarrhea from regular Metformin  Wt Readings from Last 3 Encounters:  10/18/17 236 lb (107 kg)  06/06/17 241 lb 12.8 oz (109.7 kg)  03/25/17 245 lb (111.1 kg)     Lab Results  Component Value Date   HGBA1C 6.1 (A) 10/18/2017   HGBA1C 6.8 (H) 06/06/2017   HGBA1C 6.7 03/25/2017   Lab Results  Component Value Date   MICROALBUR <0.7 01/28/2017   [place] 69 03/25/2017   CREATININE 0.89 06/06/2017     HYPOTHYROIDISM  This was  first diagnosed in  2013 after her I-131 treatment for Graves' disease in 08/2011  She did need an increase in her thyroid dosage done after initial therapy was started Difficult to assess her symptoms because of her mental retardation  No recent symptoms of increased fatigue or cold intolerance         Compliance with the medical  regimen has been as prescribed with taking the tablet in the morning before breakfast. Not on any iron or vitamins  Her TSH previously was relatively higher at 7.3, she was changed to the 175 g daily  Last TSH was low in 6/19 and she was supposed to reduce her dose by half tablet weekly but her mother did not do so Overall she feels well   Wt Readings from Last 3 Encounters:  10/18/17 236 lb (107 kg)  06/06/17 241 lb 12.8 oz (109.7 kg)  03/25/17 245 lb (111.1 kg)    Lab Results  Component Value Date   TSH 0.23 (L) 06/06/2017   TSH 1.53 03/25/2017   TSH 2.69 12/06/2016   FREET4 1.40 06/06/2017   FREET4 1.11 03/25/2017   FREET4 1.06 12/06/2016      Allergies as of 10/18/2017   No Known Allergies     Medication List        Accurate as of 10/18/17  4:14 PM. Always use your most recent med list.          glucose blood test strip Use as instructed   levothyroxine 175 MCG tablet Commonly known as:  SYNTHROID, LEVOTHROID Take 1 tablet (175 mcg total) by mouth daily.   SYNJARDY XR 12.05-998 MG Tb24 Generic drug:  Empagliflozin-metFORMIN HCl ER TAKE 2 TABLETS BY MOUTH EVERY DAY   Vitamin D (Ergocalciferol) 50000  units Caps capsule Commonly known as:  DRISDOL TAKE 1 CAPSULE BY MOUTH EVERY 7 DAYS       Past Medical History:  Diagnosis Date  . Cancer ([place])   . Congenital nystagmus   . Diabetes mellitus   . Down's syndrome   . Heart murmur   . Leukemia [facility])     Past Surgical History:  Procedure Laterality Date  . HERNIA REPAIR     umbilical    Family History  Problem Relation Age of Onset  . Diabetes Mother   . Hypertension Mother     Social History:  reports that she has never smoked. She has never used smokeless tobacco. She reports that she does not drink alcohol or use drugs.  Allergies: No Known Allergies  REVIEW of systems:   She has had Oligomenorrhea for several years of unknown etiology, may have a cycle once a year or so Previously  total testosterone was normal along with prolactin  No history of hypertension: Currently she is on Jardiance  BP Readings from Last 3 Encounters:  10/18/17 124/76  06/06/17 124/70  03/25/17 110/70      Examination:   BP 124/76   Pulse 93   Ht 4\' 7"  (1.397 m)   Wt 236 lb (107 kg)   SpO2 98%   BMI 54.85 kg/m   She has generalized obesity  Diabetic Foot Exam - Simple   Simple Foot Form Diabetic Foot exam was performed with the following findings:  Yes 10/18/2017 11:37 AM  Visual Inspection No deformities, no ulcerations, no other skin breakdown bilaterally:  Yes Sensation Testing Intact to touch and monofilament testing bilaterally:  Yes Pulse Check See comments:  Yes Comments Monofilament sensation testing is unreliable Pedal pulses not palpable        Assessments   Hypothyroidism, post ablative She is taking 175 mcg daily of the levothyroxine  Although her TSH was low on the last visit and she was supposed to reduce her dose she did not do so No complaints of fatigue  Needs follow-up TSH today    DIABETES type II:   She has progressive improvement in her obesity with using Jardiance 25 mg daily  A1c is excellent at 6.1  Currently not clear if her home monitor is reliable as it is a generic and unable to get a pattern because of the wrong time being programmed She is generally doing better with lifestyle changes and continues to lose weight    Plan:  To check blood sugars using the Accu-Chek meter given today and do some readings after meals regularly also Follow-up in 4 months Adjust thyroid medication as needed    Elayne Snare 10/18/2017, 4:14 PM   Note: This office note was prepared with Dragon voice recognition system technology. Any transcriptional errors that result from this process are unintentional.

## 2017-10-18 NOTE — Progress Notes (Signed)
poct

## 2017-10-21 ENCOUNTER — Other Ambulatory Visit: Payer: Self-pay | Admitting: Endocrinology

## 2017-10-21 MED ORDER — LEVOTHYROXINE SODIUM 150 MCG PO TABS: 150.0000 ug | ORAL_TABLET | Freq: Every day | ORAL | 3 refills | Status: DC

## 2017-11-09 ENCOUNTER — Other Ambulatory Visit: Payer: Self-pay | Admitting: Endocrinology

## 2017-12-16 ENCOUNTER — Other Ambulatory Visit: Payer: Self-pay | Admitting: Endocrinology

## 2018-01-09 ENCOUNTER — Ambulatory Visit (INDEPENDENT_AMBULATORY_CARE_PROVIDER_SITE_OTHER): Payer: Medicare Other | Admitting: Family

## 2018-01-09 ENCOUNTER — Ambulatory Visit (INDEPENDENT_AMBULATORY_CARE_PROVIDER_SITE_OTHER): Payer: Medicare Other

## 2018-01-09 ENCOUNTER — Encounter: Payer: Self-pay | Admitting: Family

## 2018-01-09 VITALS — BP 126/84 | HR 83 | Temp 97.9°F | Ht <= 58 in | Wt 234.8 lb

## 2018-01-09 DIAGNOSIS — Q909 Down syndrome, unspecified: Secondary | ICD-10-CM

## 2018-01-09 DIAGNOSIS — E890 Postprocedural hypothyroidism: Secondary | ICD-10-CM | POA: Diagnosis not present

## 2018-01-09 DIAGNOSIS — Z01818 Encounter for other preprocedural examination: Secondary | ICD-10-CM | POA: Diagnosis not present

## 2018-01-09 DIAGNOSIS — Z6841 Body Mass Index (BMI) 40.0 and over, adult: Secondary | ICD-10-CM

## 2018-01-09 DIAGNOSIS — E1169 Type 2 diabetes mellitus with other specified complication: Secondary | ICD-10-CM

## 2018-01-09 DIAGNOSIS — E559 Vitamin D deficiency, unspecified: Secondary | ICD-10-CM | POA: Diagnosis not present

## 2018-01-09 DIAGNOSIS — Z01811 Encounter for preprocedural respiratory examination: Secondary | ICD-10-CM | POA: Diagnosis not present

## 2018-01-09 LAB — BAYER DCA HB A1C WAIVED: HB A1C (BAYER DCA - WAIVED): 6.6 % (ref <7.0)

## 2018-01-09 NOTE — Progress Notes (Signed)
Subjective:    Patient ID: Julie Carter, female    DOB: 06-Sep-1985, 33 y.o.   MRN: [id number]  Chief Complaint  Patient presents with  . clearance for dental surgery   Pt presents to the office today for surgical clearance for dental work on 01/28/18. Pt is has down syndrome. She is followed by Endocrinologist annually for hypothyroid DM. Her A1C was drawn on 10/18/17 and was 6.1.  Diabetes  She presents for her follow-up diabetic visit. She has type 2 diabetes mellitus. Her disease course has been stable. Pertinent negatives for diabetes include no blurred vision, no fatigue, no foot paresthesias and no visual change. She is following a generally healthy diet. Her overall blood glucose range is 110-130 mg/dl.  Thyroid Problem  Presents for follow-up visit. Symptoms include dry skin. Patient reports no constipation, diaphoresis, fatigue, hoarse voice or visual change. The symptoms have been stable.       Review of Systems  Constitutional: Negative for diaphoresis and fatigue.  HENT: Negative for hoarse voice.   Eyes: Negative for blurred vision.  Gastrointestinal: Negative for constipation.       Objective:   Physical Exam Vitals signs reviewed.  Constitutional:      General: She is not in acute distress.    Appearance: She is well-developed. She is obese.     Comments: Down Syndrome  HENT:     Head: Normocephalic and atraumatic.     Right Ear: Tympanic membrane normal.     Left Ear: Tympanic membrane normal.  Eyes:     Pupils: Pupils are equal, round, and reactive to light.  Neck:     Musculoskeletal: Normal range of motion and neck supple.     Thyroid: No thyromegaly.  Cardiovascular:     Rate and Rhythm: Normal rate and regular rhythm.     Heart sounds: Normal heart sounds. No murmur.  Pulmonary:     Effort: Pulmonary effort is normal. No respiratory distress.     Breath sounds: Normal breath sounds. No wheezing.  Abdominal:     General: Bowel sounds are  normal. There is no distension.     Palpations: Abdomen is soft.     Tenderness: There is no abdominal tenderness.  Musculoskeletal: Normal range of motion.        General: No tenderness.  Skin:    General: Skin is warm and dry.  Neurological:     Mental Status: She is alert and oriented to person, place, and time.     Cranial Nerves: No cranial nerve deficit.     Deep Tendon Reflexes: Reflexes are normal and symmetric.  Psychiatric:        Behavior: Behavior normal.        Thought Content: Thought content normal.        Judgment: Judgment normal.       BP 126/84   Pulse 83   Temp 97.9 F (36.6 C) (Oral)   Ht _0  (1.397 m)   Wt 234 lb 12.8 oz (106.5 kg)   BMI 54.57 kg/m      Assessment & Plan:  Julie Carter comes in today with chief complaint of clearance for dental surgery   Diagnosis and orders addressed:  1. Preoperative examination - DG Chest 2 View; Future - EKG 12-Lead - CMP14+EGFR - CBC with Differential/Platelet  2. Type 2 diabetes mellitus with other specified complication, without long-term current use of insulin (HCC) - Bayer DCA Hb A1c Waived - CMP14+EGFR - CBC  with Differential/Platelet  3. Vitamin D deficiency - CMP14+EGFR - CBC with Differential/Platelet  4. Down syndrome - CMP14+EGFR - CBC with Differential/Platelet  5. Hypothyroidism, postradioiodine therapy - CMP14+EGFR - CBC with Differential/Platelet - TSH  6. Morbid obesity with BMI of 50.0-59.9, adult ([place]) - CMP14+EGFR - CBC with Differential/Platelet   Labs pending Health Maintenance reviewed Diet and exercise encouraged  Follow up plan: 6 months    Evelina Dun, FNP

## 2018-01-09 NOTE — Patient Instructions (Signed)

## 2018-01-10 LAB — CBC WITH DIFFERENTIAL/PLATELET
Basophils Absolute: 0.1 10*3/uL (ref 0.0–0.2)
Basos: 1 %
EOS (ABSOLUTE): 0.1 10*3/uL (ref 0.0–0.4)
Eos: 1 %
HEMATOCRIT: 41.7 % (ref 34.0–46.6)
Hemoglobin: 13.7 g/dL (ref 11.1–15.9)
Immature Grans (Abs): 0 10*3/uL (ref 0.0–0.1)
Immature Granulocytes: 0 %
Lymphocytes Absolute: 1.7 10*3/uL (ref 0.7–3.1)
Lymphs: 22 %
MCH: 30.7 pg (ref 26.6–33.0)
MCHC: 32.9 g/dL (ref 31.5–35.7)
MCV: 94 fL (ref 79–97)
MONOS ABS: 0.4 10*3/uL (ref 0.1–0.9)
Monocytes: 6 %
Neutrophils Absolute: 5.4 10*3/uL (ref 1.4–7.0)
Neutrophils: 70 %
Platelets: 398 10*3/uL (ref 150–450)
RBC: 4.46 x10E6/uL (ref 3.77–5.28)
RDW: 15.8 % — AB (ref 11.7–15.4)
WBC: 7.7 10*3/uL (ref 3.4–10.8)

## 2018-01-10 LAB — CMP14+EGFR
ALK PHOS: 96 IU/L (ref 39–117)
ALT: 50 IU/L — ABNORMAL HIGH (ref 0–32)
AST: 28 IU/L (ref 0–40)
Albumin/Globulin Ratio: 1.4 (ref 1.2–2.2)
Albumin: 4.2 g/dL (ref 3.5–5.5)
BUN/Creatinine Ratio: 12 (ref 9–23)
BUN: 9 mg/dL (ref 6–20)
Bilirubin Total: 0.3 mg/dL (ref 0.0–1.2)
CO2: 26 mmol/L (ref 20–29)
Calcium: 9.6 mg/dL (ref 8.7–10.2)
Chloride: 101 mmol/L (ref 96–106)
Creatinine, Ser: 0.77 mg/dL (ref 0.57–1.00)
GFR calc Af Amer: 117 mL/min/{1.73_m2} (ref >59)
GFR calc non Af Amer: 102 mL/min/{1.73_m2} (ref >59)
Globulin, Total: 2.9 g/dL (ref 1.5–4.5)
Glucose: 87 mg/dL (ref 65–99)
Potassium: 4.1 mmol/L (ref 3.5–5.2)
Sodium: 142 mmol/L (ref 134–144)
Total Protein: 7.1 g/dL (ref 6.0–8.5)

## 2018-01-10 LAB — TSH: TSH: 0.687 u[IU]/mL (ref 0.450–4.500)

## 2018-01-12 ENCOUNTER — Other Ambulatory Visit: Payer: Self-pay | Admitting: Endocrinology

## 2018-01-24 ENCOUNTER — Other Ambulatory Visit: Payer: Self-pay | Admitting: Family

## 2018-01-24 NOTE — Progress Notes (Unsigned)
Past Surgical History:  Procedure Laterality Date  . HERNIA REPAIR     umbilical

## 2018-02-20 ENCOUNTER — Other Ambulatory Visit: Payer: Self-pay | Admitting: Endocrinology

## 2018-03-25 ENCOUNTER — Other Ambulatory Visit: Payer: Self-pay | Admitting: Endocrinology

## 2018-04-17 ENCOUNTER — Telehealth: Payer: Self-pay | Admitting: Endocrinology

## 2018-04-17 ENCOUNTER — Other Ambulatory Visit: Payer: Self-pay

## 2018-04-17 MED ORDER — LEVOTHYROXINE SODIUM 150 MCG PO TABS: 150.0000 ug | ORAL_TABLET | Freq: Every day | ORAL | 3 refills | Status: DC

## 2018-04-17 MED ORDER — EMPAGLIFLOZIN-METFORMIN HCL ER 12.5-1000 MG PO TB24: 2.0000 | ORAL_TABLET | Freq: Every day | ORAL | 0 refills | Status: DC

## 2018-04-17 NOTE — Telephone Encounter (Signed)
Rx sent 

## 2018-04-17 NOTE — Telephone Encounter (Signed)
MEDICATION:  Levothyroxine - 175 MCG  PHARMACY:   [id number] [place], [place] IS THIS A 90 DAY SUPPLY : Yes   IS PATIENT OUT OF MEDICATION: Yes  IF NOT; HOW MUCH IS LEFT:   LAST APPOINTMENT DATE: @4 /16/2020  NEXT APPOINTMENT DATE:@Visit  date not found  DO WE HAVE YOUR PERMISSION TO LEAVE A DETAILED MESSAGE:  OTHER COMMENTS:    **Let patient know to contact pharmacy at the end of the day to make sure medication is ready. **  ** Please notify patient to allow 48-72 hours to process**  **Encourage patient to contact the pharmacy for refills or they can request refills through [facility]**

## 2018-05-17 ENCOUNTER — Other Ambulatory Visit: Payer: Self-pay | Admitting: Endocrinology

## 2018-06-04 ENCOUNTER — Telehealth: Payer: Self-pay | Admitting: Family

## 2018-06-05 ENCOUNTER — Ambulatory Visit (INDEPENDENT_AMBULATORY_CARE_PROVIDER_SITE_OTHER): Payer: Medicare Other | Admitting: Family

## 2018-06-05 ENCOUNTER — Encounter: Payer: Self-pay | Admitting: Family

## 2018-06-05 ENCOUNTER — Other Ambulatory Visit: Payer: Self-pay

## 2018-06-05 VITALS — BP 112/73 | HR 78 | Temp 98.1°F | Ht <= 58 in | Wt 236.0 lb

## 2018-06-05 DIAGNOSIS — Q909 Down syndrome, unspecified: Secondary | ICD-10-CM

## 2018-06-05 DIAGNOSIS — E1169 Type 2 diabetes mellitus with other specified complication: Secondary | ICD-10-CM

## 2018-06-05 DIAGNOSIS — Z6841 Body Mass Index (BMI) 40.0 and over, adult: Secondary | ICD-10-CM | POA: Diagnosis not present

## 2018-06-05 DIAGNOSIS — E890 Postprocedural hypothyroidism: Secondary | ICD-10-CM

## 2018-06-05 LAB — BAYER DCA HB A1C WAIVED: HB A1C (BAYER DCA - WAIVED): 6.8 % (ref <7.0)

## 2018-06-05 NOTE — Patient Instructions (Signed)

## 2018-06-05 NOTE — Progress Notes (Signed)
Subjective:    Patient ID: Julie Carter, female    DOB: 1985/10/31, 33 y.o.   MRN: [id number]  Chief Complaint  Patient presents with  . Medical Management of Chronic Issues  . Diabetes   Julie Carter presents to the office today with mother. Julie Carter has down syndrome.  Thyroid Problem  Presents for follow-up visit. Symptoms include depressed mood, fatigue and weight gain. The symptoms have been stable.  Diabetes  Julie Carter presents for her follow-up diabetic visit. Julie Carter has type 2 diabetes mellitus. Her disease course has been stable. Associated symptoms include fatigue. Pertinent negatives for diabetes include no blurred vision and no foot paresthesias. Symptoms are stable. Pertinent negatives for diabetic complications include no CVA or heart disease. Risk factors for coronary artery disease include dyslipidemia, diabetes mellitus and sedentary lifestyle. Julie Carter is following a generally unhealthy diet. (Does not check her blood sugars regularly )      Review of Systems  Constitutional: Positive for fatigue and weight gain.  Eyes: Negative for blurred vision.  All other systems reviewed and are negative.      Objective:   Physical Exam Vitals signs reviewed.  Constitutional:      General: Julie Carter is not in acute distress.    Appearance: Julie Carter is well-developed. Julie Carter is obese.  HENT:     Head: Macrocephalic.     Right Ear: Tympanic membrane normal.     Left Ear: Tympanic membrane normal.  Eyes:     Pupils: Pupils are equal, round, and reactive to light.  Neck:     Musculoskeletal: Normal range of motion and neck supple.     Thyroid: No thyromegaly.  Cardiovascular:     Rate and Rhythm: Normal rate and regular rhythm.     Heart sounds: Normal heart sounds. No murmur.  Pulmonary:     Effort: Pulmonary effort is normal. No respiratory distress.     Breath sounds: Normal breath sounds. No wheezing.  Abdominal:     General: Bowel sounds are normal. There is no distension.     Palpations: Abdomen is  soft.     Tenderness: There is no abdominal tenderness.  Musculoskeletal: Normal range of motion.        General: No tenderness.  Skin:    General: Skin is warm and dry.  Neurological:     Mental Status: Julie Carter is alert and oriented to person, place, and time.     Cranial Nerves: No cranial nerve deficit.     Deep Tendon Reflexes: Reflexes are normal and symmetric.  Psychiatric:        Mood and Affect: Affect is flat.        Behavior: Behavior normal.        Thought Content: Thought content normal.        Judgment: Judgment normal.       BP 112/73   Pulse 78   Temp 98.1 F (36.7 C) (Oral)   Ht 4' 6.5" (1.384 m)   Wt 236 lb (107 kg)   BMI 55.86 kg/m      Assessment & Plan:  TASMINE HIPWELL comes in today with chief complaint of Medical Management of Chronic Issues and Diabetes   Diagnosis and orders addressed:  1. Type 2 diabetes mellitus with other specified complication, without long-term current use of insulin (HCC) - Bayer DCA Hb A1c Waived - CMP14+EGFR - Microalbumin / creatinine urine ratio  2. Down syndrome - CMP14+EGFR  3. Hypothyroidism, postradioiodine therapy - CMP14+EGFR - TSH  4.  Morbid obesity with BMI of 50.0-59.9, adult ([place]) - CMP14+EGFR   Labs pending Health Maintenance reviewed Diet and exercise encouraged  Follow up plan: 6 months    Evelina Dun, FNP

## 2018-06-06 LAB — CMP14+EGFR
ALT: 49 IU/L — ABNORMAL HIGH (ref 0–32)
AST: 31 IU/L (ref 0–40)
Albumin/Globulin Ratio: 1.7 (ref 1.2–2.2)
Albumin: 4.3 g/dL (ref 3.8–4.8)
Alkaline Phosphatase: 89 IU/L (ref 39–117)
BUN/Creatinine Ratio: 9 (ref 9–23)
BUN: 8 mg/dL (ref 6–20)
Bilirubin Total: 0.4 mg/dL (ref 0.0–1.2)
CO2: 24 mmol/L (ref 20–29)
Calcium: 9.6 mg/dL (ref 8.7–10.2)
Chloride: 102 mmol/L (ref 96–106)
Creatinine, Ser: 0.91 mg/dL (ref 0.57–1.00)
GFR calc Af Amer: 95 mL/min/{1.73_m2} (ref >59)
GFR calc non Af Amer: 83 mL/min/{1.73_m2} (ref >59)
Globulin, Total: 2.6 g/dL (ref 1.5–4.5)
Glucose: 132 mg/dL — ABNORMAL HIGH (ref 65–99)
Potassium: 4.4 mmol/L (ref 3.5–5.2)
Sodium: 143 mmol/L (ref 134–144)
Total Protein: 6.9 g/dL (ref 6.0–8.5)

## 2018-06-06 LAB — TSH: TSH: 0.557 u[IU]/mL (ref 0.450–4.500)

## 2018-06-10 LAB — MICROALBUMIN / CREATININE URINE RATIO

## 2018-06-13 ENCOUNTER — Other Ambulatory Visit: Payer: Self-pay | Admitting: Endocrinology

## 2018-06-17 ENCOUNTER — Ambulatory Visit (INDEPENDENT_AMBULATORY_CARE_PROVIDER_SITE_OTHER): Payer: Medicare Other | Admitting: *Deleted

## 2018-06-17 DIAGNOSIS — Z0000 Encounter for general adult medical examination without abnormal findings: Secondary | ICD-10-CM | POA: Diagnosis not present

## 2018-06-17 NOTE — Patient Instructions (Signed)
Preventive Care 18-39 Years, Female Preventive care refers to lifestyle choices and visits with your health care provider that can promote health and wellness. What does preventive care include?   A yearly physical exam. This is also called an annual well check.  Dental exams once or twice a year.  Routine eye exams. Ask your health care provider how often you should have your eyes checked.  Personal lifestyle choices, including: ? Daily care of your teeth and gums. ? Regular physical activity. ? Eating a healthy diet. ? Avoiding tobacco and drug use. ? Limiting alcohol use. ? Practicing safe sex. ? Taking vitamin and mineral supplements as recommended by your health care provider. What happens during an annual well check? The services and screenings done by your health care provider during your annual well check will depend on your age, overall health, lifestyle risk factors, and family history of disease. Counseling Your health care provider may ask you questions about your:  Alcohol use.  Tobacco use.  Drug use.  Emotional well-being.  Home and relationship well-being.  Sexual activity.  Eating habits.  Work and work environment.  Method of birth control.  Menstrual cycle.  Pregnancy history. Screening You may have the following tests or measurements:  Height, weight, and BMI.  Diabetes screening. This is done by checking your blood sugar (glucose) after you have not eaten for a while (fasting).  Blood pressure.  Lipid and cholesterol levels. These may be checked every 5 years starting at age 20.  Skin check.  Hepatitis C blood test.  Hepatitis B blood test.  Sexually transmitted disease (STD) testing.  BRCA-related cancer screening. This may be done if you have a family history of breast, ovarian, tubal, or peritoneal cancers.  Pelvic exam and Pap test. This may be done every 3 years starting at age 21. Starting at age 30, this may be done every 5  years if you have a Pap test in combination with an HPV test. Discuss your test results, treatment options, and if necessary, the need for more tests with your health care provider. Vaccines Your health care provider may recommend certain vaccines, such as:  Influenza vaccine. This is recommended every year.  Tetanus, diphtheria, and acellular pertussis (Tdap, Td) vaccine. You may need a Td booster every 10 years.  Varicella vaccine. You may need this if you have not been vaccinated.  HPV vaccine. If you are 26 or younger, you may need three doses over 6 months.  Measles, mumps, and rubella (MMR) vaccine. You may need at least one dose of MMR. You may also need a second dose.  Pneumococcal 13-valent conjugate (PCV13) vaccine. You may need this if you have certain conditions and were not previously vaccinated.  Pneumococcal polysaccharide (PPSV23) vaccine. You may need one or two doses if you smoke cigarettes or if you have certain conditions.  Meningococcal vaccine. One dose is recommended if you are age 19-21 years and a first-year college student living in a residence hall, or if you have one of several medical conditions. You may also need additional booster doses.  Hepatitis A vaccine. You may need this if you have certain conditions or if you travel or work in places where you may be exposed to hepatitis A.  Hepatitis B vaccine. You may need this if you have certain conditions or if you travel or work in places where you may be exposed to hepatitis B.  Haemophilus influenzae type b (Hib) vaccine. You may need this if you   have certain risk factors. Talk to your health care provider about which screenings and vaccines you need and how often you need them. This information is not intended to replace advice given to you by your health care provider. Make sure you discuss any questions you have with your health care provider. Document Released: 02/13/2001 Document Revised: 07/31/2016  Document Reviewed: 10/19/2014 Elsevier Interactive Patient Education  2019 [employer].

## 2018-06-17 NOTE — Progress Notes (Signed)
MEDICARE ANNUAL WELLNESS VISIT  06/17/2018  Telephone Visit Disclaimer This Medicare AWV was conducted by telephone due to national recommendations for restrictions regarding the COVID-19 Pandemic (e.g. social distancing).  I verified, using two identifiers, that I am speaking with Julie Carter or their authorized healthcare agent. I discussed the limitations, risks, security, and privacy concerns of performing an evaluation and management service by telephone and the potential availability of an in-person appointment in the future. The patient expressed understanding and agreed to proceed.   Subjective:  Julie Carter is a 33 y.o. female patient of Hawks, Theador Hawthorne, FNP who had a Medicare Annual Wellness Visit today via telephone.Her mother/legal guardian Julie Carter was on the phone for the visit. Julie Carter is Legally disabled and lives with their family. she has 0 children. she reports that she is socially active and does interact with friends/family regularly. she is not physically active and enjoys coloring, drawing, listening to music and watching the [place] play football.  Patient Care Team: [name], FNP as PCP - General (Nurse Practitioner)  Advanced Directives 06/17/2018 01/14/2015 01/14/2015  Does Patient Have a Medical Advance Directive? No No No  Would patient like information on creating a medical advance directive? No - Patient declined No - patient declined information No - patient declined information    Hospital Utilization Over the Past 12 Months: # of hospitalizations or ER visits: 0 # of surgeries: 0  Review of Systems    Patient reports that her overall health is unchanged compared to last year.  Patient Reported Readings (BP, Pulse, CBG, Weight, etc) none  Review of Systems: No complaints  All other systems negative.  Pain Assessment Pain : No/denies pain     Current Medications & Allergies (verified) Allergies as of 06/17/2018   No  Known Allergies     Medication List       Accurate as of June 17, 2018  2:49 PM. If you have any questions, ask your nurse or doctor.        glucose blood test strip Use as instructed   levothyroxine 150 MCG tablet Commonly known as: SYNTHROID Take 1 tablet (150 mcg total) by mouth daily.   Synjardy XR 12.05-998 MG Tb24 Generic drug: Empagliflozin-metFORMIN HCl ER TAKE 2 TABLETS BY MOUTH EVERY DAY   Vitamin D (Ergocalciferol) 1.25 MG (50000 UT) Caps capsule Commonly known as: DRISDOL TAKE 1 CAPSULE BY MOUTH EVERY 7 DAYS       History (reviewed): Past Medical History:  Diagnosis Date  . Cancer ([place])   . Congenital nystagmus   . Diabetes mellitus   . Down's syndrome   . Heart murmur   . Leukemia [facility])    Past Surgical History:  Procedure Laterality Date  . HERNIA REPAIR     umbilical   Family History  Problem Relation Age of Onset  . Diabetes Mother   . Hypertension Mother   . Hypertension Father    Social History   Socioeconomic History  . Marital status: Single    Spouse name: Not on file  . Number of children: 0  . Years of education: 10  . Highest education level: 10th grade  Occupational History  . Occupation: Disabled  Social Needs  . Financial resource strain: Not hard at all  . Food insecurity    Worry: Never true    Inability: Never true  . Transportation needs    Medical: No    Non-medical: No  Tobacco  Use  . Smoking status: Never Smoker  . Smokeless tobacco: Never Used  Substance and Sexual Activity  . Alcohol use: No    Alcohol/week: 0.0 standard drinks  . Drug use: No  . Sexual activity: Never  Lifestyle  . Physical activity    Days per week: 0 days    Minutes per session: 0 min  . Stress: Patient refused  Relationships  . Social connections    Talks on phone: More than three times a week    Gets together: More than three times a week    Attends religious service: Never    Active member of club or organization: No     Attends meetings of clubs or organizations: Never    Relationship status: Never married  Other Topics Concern  . Not on file  Social History Narrative   Pt has Downs Syndrome and lives at home with her parents who take care of everything for her.    Activities of Daily Living In your present state of health, do you have any difficulty performing the following activities: 06/17/2018  Hearing? N  Vision? Y  Comment pt needs to wear glasses but she refuses to  Difficulty concentrating or making decisions? Y  Comment her mother takes care of everything for her  Walking or climbing stairs? Y  Comment she can climb stairs but just has to hold on to the rail and takes her time  Dressing or bathing? Y  Comment pt can dress herself but mother helps her with bathing  Doing errands, shopping? Y  Comment her mother does all the errands and brings her to all of her appointments  Preparing Food and eating ? Y  Comment pt can feed herself but all of her meals are prepared by her mother  Using the Toilet? N  In the past six months, have you accidently leaked urine? N  Do you have problems with loss of bowel control? N  Managing your Medications? Y  Comment mother takes care of all of her medications  Managing your Finances? Y  Comment all finances are handled by her mother  Some recent data might be hidden    Patient Literacy How often do you need to have someone help you when you read instructions, pamphlets, or other written materials from your doctor or pharmacy?: 5 - Always What is the last grade level you completed in school?: 10th grade-did special education classes  Exercise Current Exercise Habits: The patient does not participate in regular exercise at present, Exercise limited by: psychological condition(s)  Diet Patient reports consuming 2 meals a day and 1 snack(s) a day Patient reports that her primary diet is: Diabetic Patient reports that she does have regular access to food.    Depression Screen PHQ 2/9 Scores 06/17/2018 06/05/2018 01/09/2018 09/22/2015 03/22/2015 02/02/2014  PHQ - 2 Score 0 0 0 - 0 0  Exception Documentation - - - Other- indicate reason in comment box - -  Not completed - - - Down's syndrome. - -     Fall Risk Fall Risk  03/22/2015 02/02/2014  Falls in the past year? No No     Objective:  Julie Carter seemed alert and oriented and she participated appropriately during our telephone visit.  Blood Pressure Weight BMI  BP Readings from Last 3 Encounters:  06/05/18 112/73  01/09/18 126/84  10/18/17 124/76   Wt Readings from Last 3 Encounters:  06/05/18 236 lb (107 kg)  01/09/18 234 lb 12.8 oz (  106.5 kg)  10/18/17 236 lb (107 kg)   BMI Readings from Last 1 Encounters:  06/05/18 55.86 kg/m    *Unable to obtain current vital signs, weight, and BMI due to telephone visit type  Hearing/Vision  . Julie Carter's mother/legal guardian Julie Carter was on the phone for the visit and did not seem to have difficulty with hearing/understanding during the telephone conversation . Reports that she has not had a formal eye exam by an eye care professional within the past year . Reports that she has not had a formal hearing evaluation within the past year *Unable to fully assess hearing and vision during telephone visit type  Cognitive Function: No flowsheet data found. (Normal:0-7, Significant for Dysfunction: >8)  Normal Cognitive Function Screening: No: pt was unable to do the screening due to her Down Syndrome   Immunization & Health Maintenance Record Immunization History  Administered Date(s) Administered  . DTaP 04/11/1984, 09/18/1990  . Influenza Split 08/14/2011  . Influenza,inj,Quad PF,6+ Mos 11/02/2014, 09/22/2015  . Pneumococcal Conjugate-13 03/22/2015  . Pneumococcal Polysaccharide-23 01/02/2007    Health Maintenance  Topic Date Due  . TETANUS/TDAP  01/26/2003  . OPHTHALMOLOGY EXAM  04/11/2016  . PAP SMEAR-Modifier  03/22/2018  .  INFLUENZA VACCINE  08/02/2018  . FOOT EXAM  10/19/2018  . HEMOGLOBIN A1C  12/05/2018  . URINE MICROALBUMIN  06/05/2019  . PNEUMOCOCCAL POLYSACCHARIDE VACCINE AGE 6-64 HIGH RISK  Completed  . HIV Screening  Completed       Assessment  This is a routine wellness examination for [employer].  Health Maintenance: Due or Overdue Health Maintenance Due  Topic Date Due  . TETANUS/TDAP  01/26/2003  . OPHTHALMOLOGY EXAM  04/11/2016  . PAP SMEAR-Modifier  03/22/2018    Julie Carter does not need a referral for Community Assistance: Care Management:   no Social Work:    no Prescription Assistance:  no Nutrition/Diabetes Education:  no   Plan:  Personalized Goals Goals Addressed            This Visit's Progress   . DIET - REDUCE SUGAR INTAKE        Personalized Health Maintenance & Screening Recommendations  Td vaccine Diabetic Eye Exam  Lung Cancer Screening Recommended: no (Low Dose CT Chest recommended if Age 90-80 years, 30 pack-year currently smoking OR have quit w/in past 15 years) Hepatitis C Screening recommended: no HIV Screening recommended: no  Advanced Directives: Written information was not prepared per patient's request.  Referrals & Orders No orders of the defined types were placed in this encounter.   Follow-up Plan . Follow-up with [name], FNP as planned . Schedule Diabetic Eye Exam . Consider TDAP at your next visit with your PCP.   I have personally reviewed and noted the following in the patient's chart:   . Medical and social history . Use of alcohol, tobacco or illicit drugs  . Current medications and supplements . Functional ability and status . Nutritional status . Physical activity . Advanced directives . List of other physicians . Hospitalizations, surgeries, and ER visits in previous 12 months . Vitals . Screenings to include cognitive, depression, and falls . Referrals and appointments  In addition, I have  reviewed and discussed with Julie Carter certain preventive protocols, quality metrics, and best practice recommendations. A written personalized care plan for preventive services as well as general preventive health recommendations is available and can be mailed to the patient at her request.  Marylin Crosby, LPN  08/31/6740

## 2018-07-28 ENCOUNTER — Other Ambulatory Visit: Payer: Self-pay

## 2018-07-29 ENCOUNTER — Ambulatory Visit (INDEPENDENT_AMBULATORY_CARE_PROVIDER_SITE_OTHER): Payer: Medicare Other

## 2018-07-29 ENCOUNTER — Encounter: Payer: Self-pay | Admitting: Family

## 2018-07-29 ENCOUNTER — Ambulatory Visit (INDEPENDENT_AMBULATORY_CARE_PROVIDER_SITE_OTHER): Payer: Medicare Other | Admitting: Family

## 2018-07-29 VITALS — BP 130/84 | HR 76 | Temp 98.1°F | Ht <= 58 in | Wt 234.2 lb

## 2018-07-29 DIAGNOSIS — Z01818 Encounter for other preprocedural examination: Secondary | ICD-10-CM | POA: Diagnosis not present

## 2018-07-29 DIAGNOSIS — Z6841 Body Mass Index (BMI) 40.0 and over, adult: Secondary | ICD-10-CM

## 2018-07-29 DIAGNOSIS — R0683 Snoring: Secondary | ICD-10-CM | POA: Diagnosis not present

## 2018-07-29 DIAGNOSIS — Q909 Down syndrome, unspecified: Secondary | ICD-10-CM

## 2018-07-29 DIAGNOSIS — Z87798 Personal history of other (corrected) congenital malformations: Secondary | ICD-10-CM | POA: Diagnosis not present

## 2018-07-29 LAB — MICROSCOPIC EXAMINATION: Renal Epithel, UA: NONE SEEN /hpf

## 2018-07-29 LAB — URINALYSIS, COMPLETE
Bilirubin, UA: NEGATIVE
Ketones, UA: NEGATIVE
Leukocytes,UA: NEGATIVE
Nitrite, UA: NEGATIVE
Protein,UA: NEGATIVE
Specific Gravity, UA: 1.02 (ref 1.005–1.030)
Urobilinogen, Ur: 0.2 mg/dL (ref 0.2–1.0)
pH, UA: 5 (ref 5.0–7.5)

## 2018-07-29 NOTE — Progress Notes (Signed)
   Subjective:    Patient ID: Julie Carter, female    DOB: 1985/04/28, 33 y.o.   MRN: [id number]  Chief Complaint  Patient presents with  . Pre-op Exam    HPI PT presents to the office today for pre-op exam. She is scheduled for dental surgery on 08/11/18. Pt has Down syndrome and is morbid obese. She is a diabetic with her last A1C of 6.8.   She last lost 2 lbs over the last month. Her mother states she has tried to reduce her soda intake and moving around.   Denies any chest pain, SOB, and edema. Mother does state that she snores loudly and with her being obese would like a sleep study done.    Review of Systems  All other systems reviewed and are negative.      Objective:   Physical Exam Vitals signs reviewed.  Constitutional:      General: She is not in acute distress.    Appearance: She is well-developed. She is obese.  HENT:     Head: Atraumatic. Microcephalic.     Right Ear: External ear normal.  Eyes:     Pupils: Pupils are equal, round, and reactive to light.  Neck:     Musculoskeletal: Normal range of motion and neck supple.     Thyroid: No thyromegaly.  Cardiovascular:     Rate and Rhythm: Normal rate and regular rhythm.     Heart sounds: Normal heart sounds. No murmur.  Pulmonary:     Effort: Pulmonary effort is normal. No respiratory distress.     Breath sounds: Decreased breath sounds present. No wheezing.  Abdominal:     General: Bowel sounds are normal. There is no distension.     Palpations: Abdomen is soft.     Tenderness: There is no abdominal tenderness.  Musculoskeletal: Normal range of motion.        General: No tenderness.  Skin:    General: Skin is warm and dry.  Neurological:     Mental Status: She is alert and oriented to person, place, and time.     Cranial Nerves: No cranial nerve deficit.     Deep Tendon Reflexes: Reflexes are normal and symmetric.  Psychiatric:        Behavior: Behavior normal.        Thought Content: Thought  content normal.        Judgment: Judgment normal.     BP 130/84   Pulse 76   Temp 98.1 F (36.7 C) (Oral)   Ht 4' 6.5" (1.384 m)   Wt 234 lb 3.2 oz (106.2 kg)   BMI 55.44 kg/m      Assessment & Plan:  Julie Carter comes in today with chief complaint of Pre-op Exam   Diagnosis and orders addressed:  1. Pre-op evaluation - DG Chest 2 View; Future - EKG 12-Lead - Urinalysis, Complete - BMP8+EGFR - CBC with Differential/Platelet  2. Down syndrome - BMP8+EGFR - CBC with Differential/Platelet  3. Morbid obesity with BMI of 50.0-59.9, adult ([place]) - Ambulatory referral to Pulmonology - BMP8+EGFR - CBC with Differential/Platelet  4. Snoring - Ambulatory referral to Pulmonology - BMP8+EGFR - CBC with Differential/Platelet   Labs pending Once everything returns will send letter to [place], FNP

## 2018-07-29 NOTE — Patient Instructions (Signed)

## 2018-07-30 ENCOUNTER — Encounter: Payer: Self-pay | Admitting: *Deleted

## 2018-07-30 LAB — CBC WITH DIFFERENTIAL/PLATELET
Basophils Absolute: 0.1 10*3/uL (ref 0.0–0.2)
Basos: 1 %
EOS (ABSOLUTE): 0.1 10*3/uL (ref 0.0–0.4)
Eos: 2 %
Hematocrit: 41.8 % (ref 34.0–46.6)
Hemoglobin: 13.6 g/dL (ref 11.1–15.9)
Immature Grans (Abs): 0 10*3/uL (ref 0.0–0.1)
Immature Granulocytes: 0 %
Lymphocytes Absolute: 1.8 10*3/uL (ref 0.7–3.1)
Lymphs: 23 %
MCH: 30.6 pg (ref 26.6–33.0)
MCHC: 32.5 g/dL (ref 31.5–35.7)
MCV: 94 fL (ref 79–97)
Monocytes Absolute: 0.5 10*3/uL (ref 0.1–0.9)
Monocytes: 7 %
Neutrophils Absolute: 5.2 10*3/uL (ref 1.4–7.0)
Neutrophils: 67 %
Platelets: 353 10*3/uL (ref 150–450)
RBC: 4.45 x10E6/uL (ref 3.77–5.28)
RDW: 15.9 % — ABNORMAL HIGH (ref 11.7–15.4)
WBC: 7.7 10*3/uL (ref 3.4–10.8)

## 2018-07-30 LAB — BMP8+EGFR
BUN/Creatinine Ratio: 11 (ref 9–23)
BUN: 9 mg/dL (ref 6–20)
CO2: 24 mmol/L (ref 20–29)
Calcium: 9.6 mg/dL (ref 8.7–10.2)
Chloride: 102 mmol/L (ref 96–106)
Creatinine, Ser: 0.79 mg/dL (ref 0.57–1.00)
GFR calc Af Amer: 113 mL/min/{1.73_m2} (ref >59)
GFR calc non Af Amer: 98 mL/min/{1.73_m2} (ref >59)
Glucose: 94 mg/dL (ref 65–99)
Potassium: 4.3 mmol/L (ref 3.5–5.2)
Sodium: 146 mmol/L — ABNORMAL HIGH (ref 134–144)

## 2018-08-05 ENCOUNTER — Other Ambulatory Visit: Payer: Self-pay | Admitting: Family

## 2018-08-07 ENCOUNTER — Telehealth: Payer: Self-pay | Admitting: Family

## 2018-08-07 NOTE — Telephone Encounter (Signed)
Fax last office note to dr Lovena Le for surigal clearance

## 2018-08-11 DIAGNOSIS — K029 Dental caries, unspecified: Secondary | ICD-10-CM | POA: Diagnosis not present

## 2018-08-11 DIAGNOSIS — Q909 Down syndrome, unspecified: Secondary | ICD-10-CM | POA: Diagnosis not present

## 2018-08-11 DIAGNOSIS — E039 Hypothyroidism, unspecified: Secondary | ICD-10-CM | POA: Diagnosis not present

## 2018-08-11 DIAGNOSIS — E119 Type 2 diabetes mellitus without complications: Secondary | ICD-10-CM | POA: Diagnosis not present

## 2018-08-11 DIAGNOSIS — Z79899 Other long term (current) drug therapy: Secondary | ICD-10-CM | POA: Diagnosis not present

## 2018-08-11 DIAGNOSIS — Z6841 Body Mass Index (BMI) 40.0 and over, adult: Secondary | ICD-10-CM | POA: Diagnosis not present

## 2018-09-10 ENCOUNTER — Other Ambulatory Visit: Payer: Self-pay

## 2018-09-10 ENCOUNTER — Telehealth: Payer: Self-pay | Admitting: Endocrinology

## 2018-09-10 NOTE — Telephone Encounter (Signed)
Needs appointment for further refills

## 2018-09-10 NOTE — Telephone Encounter (Signed)
Patient is scheduled for follow up with same day labs on this Friday 09/12/2018, has been screened and advised

## 2018-09-10 NOTE — Telephone Encounter (Signed)
Refill or deny? 

## 2018-09-10 NOTE — Telephone Encounter (Signed)
Please schedule pt for f/u and refills will be sent.

## 2018-09-11 NOTE — Telephone Encounter (Signed)
Noted  

## 2018-09-12 ENCOUNTER — Ambulatory Visit: Payer: Medicare Other | Admitting: Endocrinology

## 2018-09-16 ENCOUNTER — Institutional Professional Consult (permissible substitution): Payer: Medicare Other | Admitting: Pulmonary Disease

## 2018-09-19 ENCOUNTER — Ambulatory Visit (INDEPENDENT_AMBULATORY_CARE_PROVIDER_SITE_OTHER): Payer: Medicare Other | Admitting: Pulmonary Disease

## 2018-09-19 ENCOUNTER — Encounter: Payer: Self-pay | Admitting: Pulmonary Disease

## 2018-09-19 ENCOUNTER — Other Ambulatory Visit: Payer: Self-pay

## 2018-09-19 VITALS — BP 114/60 | HR 88 | Temp 97.4°F | Ht 59.0 in | Wt 231.0 lb

## 2018-09-19 DIAGNOSIS — G4719 Other hypersomnia: Secondary | ICD-10-CM

## 2018-09-19 DIAGNOSIS — Z87898 Personal history of other specified conditions: Secondary | ICD-10-CM

## 2018-09-19 NOTE — Progress Notes (Signed)
Julie Carter    [id number]    [birth year]  Primary Care Physician:Hawks, Theador Hawthorne, FNP  Referring Physician: [name], [address] [place],  [place] [address]  Chief complaint:   Patient being seen for loud snoring, gasping for breath at night  HPI:  Patient did have a sleep study done about 12 years ago No intervention was started at that time, her mom is not aware of results  She snores loudly Gasping respirations She does not complain about any significant headache or dryness No family history of obstructive sleep apnea Weight has been relatively stable  Patient requires assistance with daily activities  She does have a history of Down syndrome and hypothyroidism  Never smoker  Mom is available to help with investigation and management  Outpatient Encounter Medications as of 09/19/2018  Medication Sig  . glucose blood test strip Use as instructed  . levothyroxine (SYNTHROID, LEVOTHROID) 150 MCG tablet Take 1 tablet (150 mcg total) by mouth daily.  [name] SYNJARDY XR 12.05-998 MG TB24 TAKE 2 TABLETS BY MOUTH EVERY DAY  . Vitamin D, Ergocalciferol, (DRISDOL) 50000 units CAPS capsule TAKE 1 CAPSULE BY MOUTH EVERY 7 DAYS   No facility-administered encounter medications on file as of 09/19/2018.     Allergies as of 09/19/2018  . (No Known Allergies)    Past Medical History:  Diagnosis Date  . Cancer ([place])   . Congenital nystagmus   . Diabetes mellitus   . Down's syndrome   . Heart murmur   . Leukemia [facility])     Past Surgical History:  Procedure Laterality Date  . HERNIA REPAIR     umbilical    Family History  Problem Relation Age of Onset  . Diabetes Mother   . Hypertension Mother   . Hypertension Father     Social History   Socioeconomic History  . Marital status: Single    Spouse name: Not on file  . Number of children: 0  . Years of education: 10  . Highest education level: 10th grade  Occupational History  .  Occupation: Disabled  Social Needs  . Financial resource strain: Not hard at all  . Food insecurity    Worry: Never true    Inability: Never true  . Transportation needs    Medical: No    Non-medical: No  Tobacco Use  . Smoking status: Never Smoker  . Smokeless tobacco: Never Used  Substance and Sexual Activity  . Alcohol use: No    Alcohol/week: 0.0 standard drinks  . Drug use: No  . Sexual activity: Never  Lifestyle  . Physical activity    Days per week: 0 days    Minutes per session: 0 min  . Stress: Patient refused  Relationships  . Social connections    Talks on phone: More than three times a week    Gets together: More than three times a week    Attends religious service: Never    Active member of club or organization: No    Attends meetings of clubs or organizations: Never    Relationship status: Never married  . Intimate partner violence    Fear of current or ex partner: No    Emotionally abused: No    Physically abused: No    Forced sexual activity: No  Other Topics Concern  . Not on file  Social History Narrative   Pt has Downs Syndrome and lives at home with  her parents who take care of everything for her.    Review of Systems  Constitutional: Positive for fatigue.  Eyes: Negative.   Respiratory: Positive for apnea.   Cardiovascular: Negative.   Gastrointestinal: Negative.   Psychiatric/Behavioral: Positive for sleep disturbance.    Vitals:   09/19/18 1420 09/19/18 1421  BP:  114/60  Pulse:  88  Temp: (!) 97.4 F (36.3 C)   SpO2:  99%     Physical Exam  Constitutional:  Obese  HENT:  Head: Normocephalic.  Mallampati 4, crowded oropharynx  Eyes: Pupils are equal, round, and reactive to light. Right eye exhibits no discharge. Left eye exhibits no discharge.  Neck: Normal range of motion. Neck supple. No tracheal deviation present. No thyromegaly present.  Cardiovascular: Normal rate and regular rhythm.  Pulmonary/Chest: Effort normal and  breath sounds normal. No respiratory distress. She has no wheezes. She has no rales. She exhibits no tenderness.  Abdominal: Soft.  Musculoskeletal: Normal range of motion.        General: No edema.  Neurological: She is alert.  Skin: Skin is warm and dry.    Results of the Epworth flowsheet 09/19/2018  Sitting and reading 3  Watching TV 3  Sitting, inactive in a public place (e.g. a theatre or a meeting) 3  As a passenger in a car for an hour without a break 3  Lying down to rest in the afternoon when circumstances permit 3  Sitting and talking to someone 0  Sitting quietly after a lunch without alcohol 3  In a car, while stopped for a few minutes in traffic 2  Total score 20   Assessment:  High probability of significant sleep disordered -Crowded oropharynx, Mallampati 4 -Obese - thick neck  Hypothyroidism  Morbid obesity  Down's syndrome  Pathophysiology of sleep disordered breathing discussed with the patient Plan/Recommendations:  Schedule patient for a home sleep study  Treatment options as discussed  I will see you in about 3 months  Encouraged to exercise to try and keep weight in check   Sherrilyn Rist MD Obese, crowded oropharynx, [facility] 09/19/2018, 2:36 PM  CC: [name], FNP

## 2018-09-19 NOTE — Addendum Note (Signed)
Addended by: Hildred Alamin I on: 09/19/2018 03:01 PM   Modules accepted: Orders

## 2018-09-19 NOTE — Patient Instructions (Signed)
High probability of significant sleep disordered breathing  Obesity  We will schedule you for a home sleep study Treatment of sleep apnea will involve CPAP use  I will see you back in 3 months   Sleep Apnea Sleep apnea is a condition in which breathing pauses or becomes shallow during sleep. Episodes of sleep apnea usually last 10 seconds or longer, and they may occur as many as 20 times an hour. Sleep apnea disrupts your sleep and keeps your body from getting the rest that it needs. This condition can increase your risk of certain health problems, including:  Heart attack.  Stroke.  Obesity.  Diabetes.  Heart failure.  Irregular heartbeat. What are the causes? There are three kinds of sleep apnea:  Obstructive sleep apnea. This kind is caused by a blocked or collapsed airway.  Central sleep apnea. This kind happens when the part of the brain that controls breathing does not send the correct signals to the muscles that control breathing.  Mixed sleep apnea. This is a combination of obstructive and central sleep apnea. The most common cause of this condition is a collapsed or blocked airway. An airway can collapse or become blocked if:  Your throat muscles are abnormally relaxed.  Your tongue and tonsils are larger than normal.  You are overweight.  Your airway is smaller than normal. What increases the risk? You are more likely to develop this condition if you:  Are overweight.  Smoke.  Have a smaller than normal airway.  Are elderly.  Are female.  Drink alcohol.  Take sedatives or tranquilizers.  Have a family history of sleep apnea. What are the signs or symptoms? Symptoms of this condition include:  Trouble staying asleep.  Daytime sleepiness and tiredness.  Irritability.  Loud snoring.  Morning headaches.  Trouble concentrating.  Forgetfulness.  Decreased interest in sex.  Unexplained sleepiness.  Mood swings.  Personality changes.   Feelings of depression.  Waking up often during the night to urinate.  Dry mouth.  Sore throat. How is this diagnosed? This condition may be diagnosed with:  A medical history.  A physical exam.  A series of tests that are done while you are sleeping (sleep study). These tests are usually done in a sleep lab, but they may also be done at home. How is this treated? Treatment for this condition aims to restore normal breathing and to ease symptoms during sleep. It may involve managing health issues that can affect breathing, such as high blood pressure or obesity. Treatment may include:  Sleeping on your side.  Using a decongestant if you have nasal congestion.  Avoiding the use of depressants, including alcohol, sedatives, and narcotics.  Losing weight if you are overweight.  Making changes to your diet.  Quitting smoking.  Using a device to open your airway while you sleep, such as: ? An oral appliance. This is a custom-made mouthpiece that shifts your lower jaw forward. ? A continuous positive airway pressure (CPAP) device. This device blows air through a mask when you breathe out (exhale). ? A nasal expiratory positive airway pressure (EPAP) device. This device has valves that you put into each nostril. ? A bi-level positive airway pressure (BPAP) device. This device blows air through a mask when you breathe in (inhale) and breathe out (exhale).  Having surgery if other treatments do not work. During surgery, excess tissue is removed to create a wider airway. It is important to get treatment for sleep apnea. Without treatment, this condition  can lead to:  High blood pressure.  Coronary artery disease.  In men, an inability to achieve or maintain an erection (impotence).  Reduced thinking abilities. Follow these instructions at home: Lifestyle  Make any lifestyle changes that your health care provider recommends.  Eat a healthy, well-balanced diet.  Take steps  to lose weight if you are overweight.  Avoid using depressants, including alcohol, sedatives, and narcotics.  Do not use any products that contain nicotine or tobacco, such as cigarettes, e-cigarettes, and chewing tobacco. If you need help quitting, ask your health care provider. General instructions  Take over-the-counter and prescription medicines only as told by your health care provider.  If you were given a device to open your airway while you sleep, use it only as told by your health care provider.  If you are having surgery, make sure to tell your health care provider you have sleep apnea. You may need to bring your device with you.  Keep all follow-up visits as told by your health care provider. This is important. Contact a health care provider if:  The device that you received to open your airway during sleep is uncomfortable or does not seem to be working.  Your symptoms do not improve.  Your symptoms get worse. Get help right away if:  You develop: ? Chest pain. ? Shortness of breath. ? Discomfort in your back, arms, or stomach.  You have: ? Trouble speaking. ? Weakness on one side of your body. ? Drooping in your face. These symptoms may represent a serious problem that is an emergency. Do not wait to see if the symptoms will go away. Get medical help right away. Call your local emergency services (911 in the [place] Do not drive yourself to the hospital. Summary  Sleep apnea is a condition in which breathing pauses or becomes shallow during sleep.  The most common cause is a collapsed or blocked airway.  The goal of treatment is to restore normal breathing and to ease symptoms during sleep. This information is not intended to replace advice given to you by your health care provider. Make sure you discuss any questions you have with your health care provider. Document Released: 12/08/2001 Document Revised: 10/04/2017 Document Reviewed: 08/13/2017 Elsevier Patient  Education  2020 [employer].

## 2018-09-23 ENCOUNTER — Other Ambulatory Visit: Payer: Self-pay

## 2018-09-23 ENCOUNTER — Encounter: Payer: Self-pay | Admitting: Endocrinology

## 2018-09-23 ENCOUNTER — Ambulatory Visit (INDEPENDENT_AMBULATORY_CARE_PROVIDER_SITE_OTHER): Payer: Medicare Other | Admitting: Endocrinology

## 2018-09-23 VITALS — BP 122/70 | HR 79 | Ht 59.0 in | Wt 231.6 lb

## 2018-09-23 DIAGNOSIS — E1169 Type 2 diabetes mellitus with other specified complication: Secondary | ICD-10-CM

## 2018-09-23 DIAGNOSIS — E669 Obesity, unspecified: Secondary | ICD-10-CM

## 2018-09-23 DIAGNOSIS — E890 Postprocedural hypothyroidism: Secondary | ICD-10-CM

## 2018-09-23 DIAGNOSIS — Z23 Encounter for immunization: Secondary | ICD-10-CM

## 2018-09-23 LAB — LIPID PANEL
Cholesterol: 160 mg/dL (ref 0–200)
HDL: 48.2 mg/dL (ref >39.00)
LDL Cholesterol: 90 mg/dL (ref 0–99)
NonHDL: 111.87
Total CHOL/HDL Ratio: 3
Triglycerides: 109 mg/dL (ref 0.0–149.0)
VLDL: 21.8 mg/dL (ref 0.0–40.0)

## 2018-09-23 LAB — COMPREHENSIVE METABOLIC PANEL
ALT: 44 U/L — ABNORMAL HIGH (ref 0–35)
AST: 29 U/L (ref 0–37)
Albumin: 4 g/dL (ref 3.5–5.2)
Alkaline Phosphatase: 79 U/L (ref 39–117)
BUN: 9 mg/dL (ref 6–23)
CO2: 31 mEq/L (ref 19–32)
Calcium: 9.8 mg/dL (ref 8.4–10.5)
Chloride: 101 mEq/L (ref 96–112)
Creatinine, Ser: 0.78 mg/dL (ref 0.40–1.20)
GFR: 84.21 mL/min (ref >60.00)
Glucose, Bld: 119 mg/dL — ABNORMAL HIGH (ref 70–99)
Potassium: 3.9 mEq/L (ref 3.5–5.1)
Sodium: 139 mEq/L (ref 135–145)
Total Bilirubin: 0.5 mg/dL (ref 0.2–1.2)
Total Protein: 7.3 g/dL (ref 6.0–8.3)

## 2018-09-23 LAB — POCT GLYCOSYLATED HEMOGLOBIN (HGB A1C): Hemoglobin A1C: 6.3 % — AB (ref 4.0–5.6)

## 2018-09-23 LAB — MICROALBUMIN / CREATININE URINE RATIO
Creatinine,U: 105.6 mg/dL
Microalb Creat Ratio: 0.7 mg/g (ref 0.0–30.0)
Microalb, Ur: <0.7 mg/dL (ref 0.0–1.9)

## 2018-09-23 LAB — TSH: TSH: 0.29 u[IU]/mL — ABNORMAL LOW (ref 0.35–4.50)

## 2018-09-23 LAB — GLUCOSE, POCT (MANUAL RESULT ENTRY): POC Glucose: 118 mg/dl — AB (ref 70–99)

## 2018-09-23 NOTE — Progress Notes (Signed)
Patient ID: Julie Carter, female   DOB: [birth year], 33 y.o.   MRN: [id number]    Reason for Appointment:  Endocrinology, followup visit    History of Present Illness:   DIABETES type II  She has had diabetes for several years, previously treated with metformin alone by PCP Jardiance was added for better control in 12/2016 when A1c was 9.3    Current management, blood sugar results and problems identified:   Oral hypoglycemic drugs: Synjardy XR 12.05/998 mg, 2 tablets daily  A1c is now 6.3  She has not been seen in follow-up since 10/2017   She has not checked her sugars lately  Her mother says that she lost her meter 3 weeks ago  Reportedly blood sugars are about 120 when checked and no higher than 140 after eating  She has lost a little weight since last year  However still not motivated to do any walking or exercise  Also diet has been poor with the eating sweets of all kinds, drinking root beer and sweet tea  Continues to lose weight gradually and has lost another 5 pounds  Fasting glucose in the lab today 118  No side effects from [place] and reportedly taking this regularly     Side effects from medications: Some diarrhea from regular Metformin  Wt Readings from Last 3 Encounters:  09/23/18 231 lb 9.6 oz (105.1 kg)  09/19/18 231 lb (104.8 kg)  07/29/18 234 lb 3.2 oz (106.2 kg)     Lab Results  Component Value Date   HGBA1C 6.3 (A) 09/23/2018   HGBA1C 6.8 06/05/2018   HGBA1C 6.6 01/09/2018   Lab Results  Component Value Date   MICROALBUR <0.7 01/28/2017   [place] 94 10/18/2017   CREATININE 0.79 07/29/2018     HYPOTHYROIDISM  This was  first diagnosed in  2013 after her I-131 treatment for Graves' disease in 08/2011  She did need an increase in her thyroid dosage done after initial therapy was started Difficult to assess her symptoms because of her mental retardation  She may tend to be sleepy during the day at times and has  low motivation level         She is taking her levothyroxine consistently before breakfast and is supervised by her mother Not on any iron or calcium-containing vitamins  Her TSH has been consistently normal, her dose was reduced and 10/19 but she did not follow-up subsequently, has been seen by PCP in June   Wt Readings from Last 3 Encounters:  09/23/18 231 lb 9.6 oz (105.1 kg)  09/19/18 231 lb (104.8 kg)  07/29/18 234 lb 3.2 oz (106.2 kg)    Lab Results  Component Value Date   TSH 0.557 06/05/2018   TSH 0.687 01/09/2018   TSH 0.14 (L) 10/18/2017   FREET4 1.29 10/18/2017   FREET4 1.40 06/06/2017   FREET4 1.11 03/25/2017      Allergies as of 09/23/2018   No Known Allergies     Medication List       Accurate as of September 23, 2018  9:05 AM. If you have any questions, ask your nurse or doctor.        glucose blood test strip Use as instructed   levothyroxine 150 MCG tablet Commonly known as: SYNTHROID Take 1 tablet (150 mcg total) by mouth daily.   Synjardy XR 12.05-998 MG Tb24 Generic drug: Empagliflozin-metFORMIN HCl ER TAKE 2 TABLETS BY MOUTH EVERY DAY   Vitamin D (Ergocalciferol) 1.25 MG (  50000 UT) Caps capsule Commonly known as: DRISDOL TAKE 1 CAPSULE BY MOUTH EVERY 7 DAYS       Past Medical History:  Diagnosis Date  . Cancer ([place])   . Congenital nystagmus   . Diabetes mellitus   . Down's syndrome   . Heart murmur   . Leukemia [facility])     Past Surgical History:  Procedure Laterality Date  . HERNIA REPAIR     umbilical    Family History  Problem Relation Age of Onset  . Diabetes Mother   . Hypertension Mother   . Hypertension Father     Social History:  reports that she has never smoked. She has never used smokeless tobacco. She reports that she does not drink alcohol or use drugs.  Allergies: No Known Allergies  REVIEW of systems:   She has had Oligomenorrhea for several years of unknown etiology, may have a cycle once a year or  so Previously total testosterone was normal along with prolactin  No history of hypertension: However she is on Jardiance  BP Readings from Last 3 Encounters:  09/23/18 122/70  09/19/18 114/60  07/29/18 0000000   Diabetes complications: She does complain of some burning in her feet at night   Examination:   BP 122/70 (BP Location: Right Arm, Patient Position: Sitting, Cuff Size: Normal)   Pulse 79   Ht 4\' 11"  (1.499 m)   Wt 231 lb 9.6 oz (105.1 kg)   SpO2 97%   BMI 46.78 kg/m   She has generalized obesity      Assessments   Hypothyroidism, post ablative She is taking 150 mcg daily of the levothyroxine for some time  Subjectively doing well although has daytime somnolence likely from sleep apnea She has been regular with taking her supplement before breakfast daily Last TSH was in June  Needs follow-up TSH today    DIABETES type II:   On Synjardy  A1c is still fairly good at 6.3  She has benefited from using Jardiance Blood sugars are controlled despite poor compliance with diet and exercise regimen However her weight has improved since starting Jardiance in 2018 and she is a little less insulin resistant  Probable mild neuropathy: She has mild symptoms of burning in her feet at night but according to the mother she does not have any difficulty sleeping with this.  No pain or numbness   Plan:  To start rechecking blood sugars using the Accu-Chek meter given today Discussed when to check blood sugars and blood sugar targets  She will need to start walking regularly for exercise She will try to eliminate all simple sugars both and sweets and drinks Discussed using sweeteners such as Stevia  Labs to be checked today  Influenza vaccine given, given information on the vaccine  Total visit time for evaluation and management of multiple problems and counseling =25 minutes  Elayne Snare 09/23/2018, 9:05 AM   Note: This office note was prepared with Dragon voice  recognition system technology. Any transcriptional errors that result from this process are unintentional.

## 2018-09-23 NOTE — Addendum Note (Signed)
Addended by: Kaylyn Lim I on: 09/23/2018 02:07 PM   Modules accepted: Orders

## 2018-09-23 NOTE — Addendum Note (Signed)
Addended by: Kaylyn Lim I on: 09/23/2018 02:09 PM   Modules accepted: Orders

## 2018-09-23 NOTE — Patient Instructions (Addendum)
Check blood sugars on waking up 2-3  days a week ? ?Also check blood sugars about 2 hours after meals and do this after different meals by rotation ? ?Recommended blood sugar levels on waking up are 90-130 and about 2 hours after meal is 130-160 ? ?Please bring your blood sugar monitor to each visit, thank you ? ?Walk daily ?

## 2018-09-24 ENCOUNTER — Other Ambulatory Visit: Payer: Self-pay | Admitting: Endocrinology

## 2018-09-24 ENCOUNTER — Other Ambulatory Visit: Payer: Self-pay

## 2018-09-24 MED ORDER — LEVOTHYROXINE SODIUM 137 MCG PO TABS: 137.0000 ug | ORAL_TABLET | Freq: Every day | ORAL | 3 refills | Status: DC

## 2018-10-03 ENCOUNTER — Ambulatory Visit: Payer: Medicare Other

## 2018-10-03 ENCOUNTER — Other Ambulatory Visit: Payer: Self-pay

## 2018-10-03 DIAGNOSIS — G4719 Other hypersomnia: Secondary | ICD-10-CM

## 2018-10-03 DIAGNOSIS — Z87898 Personal history of other specified conditions: Secondary | ICD-10-CM

## 2018-10-05 DIAGNOSIS — G4733 Obstructive sleep apnea (adult) (pediatric): Secondary | ICD-10-CM

## 2018-10-08 DIAGNOSIS — G4733 Obstructive sleep apnea (adult) (pediatric): Secondary | ICD-10-CM

## 2018-10-09 ENCOUNTER — Telehealth: Payer: Self-pay | Admitting: Pulmonary Disease

## 2018-10-09 DIAGNOSIS — G4733 Obstructive sleep apnea (adult) (pediatric): Secondary | ICD-10-CM

## 2018-10-09 NOTE — Telephone Encounter (Signed)
Dr. Ander Slade has reviewed the home sleep test this showed severe obstructive sleep apnea.  Recommendations   Treatment options are CPAP with the settings auto 5 to 20.    Weight loss measures .   Advise against driving while sleepy & against medication with sedative side effects.    Make appointment for 3 months for compliance with download with Dr. Ander Slade.   Called and spoke with Patient. Dr. Judson Roch results and recommendations given.  Understanding stated. DME order placed. OV scheduled 12/30/18 at 1030.  Nothing further at this time.

## 2018-11-18 ENCOUNTER — Ambulatory Visit: Payer: Medicare Other | Admitting: Endocrinology

## 2018-12-04 ENCOUNTER — Encounter: Payer: Self-pay | Admitting: Family

## 2018-12-04 ENCOUNTER — Ambulatory Visit (INDEPENDENT_AMBULATORY_CARE_PROVIDER_SITE_OTHER): Payer: Medicare Other | Admitting: Family

## 2018-12-04 ENCOUNTER — Other Ambulatory Visit: Payer: Self-pay

## 2018-12-04 VITALS — BP 130/94 | HR 82 | Temp 98.7°F | Ht 59.0 in | Wt 232.0 lb

## 2018-12-04 DIAGNOSIS — E559 Vitamin D deficiency, unspecified: Secondary | ICD-10-CM

## 2018-12-04 DIAGNOSIS — E1169 Type 2 diabetes mellitus with other specified complication: Secondary | ICD-10-CM | POA: Diagnosis not present

## 2018-12-04 DIAGNOSIS — E890 Postprocedural hypothyroidism: Secondary | ICD-10-CM | POA: Diagnosis not present

## 2018-12-04 DIAGNOSIS — Z6841 Body Mass Index (BMI) 40.0 and over, adult: Secondary | ICD-10-CM

## 2018-12-04 DIAGNOSIS — Q909 Down syndrome, unspecified: Secondary | ICD-10-CM | POA: Diagnosis not present

## 2018-12-04 LAB — BAYER DCA HB A1C WAIVED: HB A1C (BAYER DCA - WAIVED): 6.5 % (ref <7.0)

## 2018-12-04 NOTE — Patient Instructions (Signed)
Exercising to Stay Healthy To become healthy and stay healthy, it is recommended that you do moderate-intensity and vigorous-intensity exercise. You can tell that you are exercising at a moderate intensity if your heart starts beating faster and you start breathing faster but can still hold a conversation. You can tell that you are exercising at a vigorous intensity if you are breathing much harder and faster and cannot hold a conversation while exercising. Exercising regularly is important. It has many health benefits, such as:  Improving overall fitness, flexibility, and endurance.  Increasing bone density.  Helping with weight control.  Decreasing body fat.  Increasing muscle strength.  Reducing stress and tension.  Improving overall health. How often should I exercise? Choose an activity that you enjoy, and set realistic goals. Your health care provider can help you make an activity plan that works for you. Exercise regularly as told by your health care provider. This may include:  Doing strength training two times a week, such as: ? Lifting weights. ? Using resistance bands. ? Push-ups. ? Sit-ups. ? Yoga.  Doing a certain intensity of exercise for a given amount of time. Choose from these options: ? A total of 150 minutes of moderate-intensity exercise every week. ? A total of 75 minutes of vigorous-intensity exercise every week. ? A mix of moderate-intensity and vigorous-intensity exercise every week. Children, pregnant women, people who have not exercised regularly, people who are overweight, and older adults may need to talk with a health care provider about what activities are safe to do. If you have a medical condition, be sure to talk with your health care provider before you start a new exercise program. What are some exercise ideas? Moderate-intensity exercise ideas include:  Walking 1 mile (1.6 km) in about 15 minutes.  Biking.  Hiking.  Golfing.  Dancing.   Water aerobics. Vigorous-intensity exercise ideas include:  Walking 4.5 miles (7.2 km) or more in about 1 hour.  Jogging or running 5 miles (8 km) in about 1 hour.  Biking 10 miles (16.1 km) or more in about 1 hour.  Lap swimming.  Roller-skating or in-line skating.  Cross-country skiing.  Vigorous competitive sports, such as football, basketball, and soccer.  Jumping rope.  Aerobic dancing. What are some everyday activities that can help me to get exercise?  Yard work, such as: ? Pushing a lawn mower. ? Raking and bagging leaves.  Washing your car.  Pushing a stroller.  Shoveling snow.  Gardening.  Washing windows or floors. How can I be more active in my day-to-day activities?  Use stairs instead of an elevator.  Take a walk during your lunch break.  If you drive, park your car farther away from your work or school.  If you take public transportation, get off one stop early and walk the rest of the way.  Stand up or walk around during all of your indoor phone calls.  Get up, stretch, and walk around every 30 minutes throughout the day.  Enjoy exercise with a friend. Support to continue exercising will help you keep a regular routine of activity. What guidelines can I follow while exercising?  Before you start a new exercise program, talk with your health care provider.  Do not exercise so much that you hurt yourself, feel dizzy, or get very short of breath.  Wear comfortable clothes and wear shoes with good support.  Drink plenty of water while you exercise to prevent dehydration or heat stroke.  Work out until your breathing   and your heartbeat get faster. Where to find more information  [place] Department of Health and Human Services: www.hhs.gov  Centers for Disease Control and Prevention (CDC): www.cdc.gov Summary  Exercising regularly is important. It will improve your overall fitness, flexibility, and endurance.  Regular exercise also will  improve your overall health. It can help you control your weight, reduce stress, and improve your bone density.  Do not exercise so much that you hurt yourself, feel dizzy, or get very short of breath.  Before you start a new exercise program, talk with your health care provider. This information is not intended to replace advice given to you by your health care provider. Make sure you discuss any questions you have with your health care provider. Document Released: 01/20/2010 Document Revised: 11/30/2016 Document Reviewed: 11/08/2016 Elsevier Patient Education  2020 [employer]  

## 2018-12-04 NOTE — Progress Notes (Signed)
Subjective:    Patient ID: Julie Carter, female    DOB: January 21, 1985, 33 y.o.   MRN: [id number]  Chief Complaint  Patient presents with  . Medical Management of Chronic Issues  . Diabetes   PT presents to the office today with mother. She has down syndrome.  Diabetes She presents for her follow-up diabetic visit. She has type 2 diabetes mellitus. Her disease course has been stable. Pertinent negatives for hypoglycemia include no confusion or nervousness/anxiousness. Pertinent negatives for diabetes include no blurred vision, no foot paresthesias and no visual change. Symptoms are stable. Pertinent negatives for diabetic complications include no CVA, heart disease, nephropathy or peripheral neuropathy. Risk factors for coronary artery disease include dyslipidemia, diabetes mellitus and sedentary lifestyle. She is following a generally unhealthy diet. Her overall blood glucose range is 110-130 mg/dl.  Thyroid Problem Presents for follow-up visit. Symptoms include dry skin. Patient reports no anxiety, depressed mood, diaphoresis or visual change. The symptoms have been stable.      Review of Systems  Constitutional: Negative for diaphoresis.  Eyes: Negative for blurred vision.  Psychiatric/Behavioral: Negative for confusion. The patient is not nervous/anxious.   All other systems reviewed and are negative.      Objective:   Physical Exam Vitals signs reviewed.  Constitutional:      General: She is not in acute distress.    Appearance: She is well-developed.  HENT:     Head: Normocephalic and atraumatic.  Eyes:     Pupils: Pupils are equal, round, and reactive to light.  Neck:     Musculoskeletal: Normal range of motion and neck supple.     Thyroid: No thyromegaly.  Cardiovascular:     Rate and Rhythm: Normal rate and regular rhythm.     Heart sounds: Normal heart sounds. No murmur.  Pulmonary:     Effort: Pulmonary effort is normal. No respiratory distress.     Breath  sounds: Normal breath sounds. No wheezing.  Abdominal:     General: Bowel sounds are normal. There is no distension.     Palpations: Abdomen is soft.     Tenderness: There is no abdominal tenderness.  Musculoskeletal: Normal range of motion.        General: No tenderness.  Skin:    General: Skin is warm and dry.  Neurological:     Mental Status: She is alert and oriented to person, place, and time.     Cranial Nerves: No cranial nerve deficit.     Deep Tendon Reflexes: Reflexes are normal and symmetric.  Psychiatric:        Behavior: Behavior normal.        Thought Content: Thought content normal.        Judgment: Judgment normal.       BP (!) 130/94   Pulse 82   Temp 98.7 F (37.1 C) (Temporal)   Ht 4' 11"  (1.499 m)   Wt 232 lb (105.2 kg)   SpO2 98%   BMI 46.86 kg/m      Assessment & Plan:  Julie Carter comes in today with chief complaint of Medical Management of Chronic Issues and Diabetes   Diagnosis and orders addressed:  1. Type 2 diabetes mellitus with other specified complication, without long-term current use of insulin (HCC) - hgba1c - CMP14+EGFR  2. Hypothyroidism, postradioiodine therapy - CMP14+EGFR - TSH  3. Down syndrome - CMP14+EGFR  4. Vitamin D deficiency - CMP14+EGFR - Vitamin D 25 hydroxy  5. Morbid obesity  with BMI of 50.0-59.9, adult ([place]) - CMP14+EGFR   Labs pending Health Maintenance reviewed Diet and exercise encouraged  Follow up plan: 4 months   Julie Dun, FNP

## 2018-12-05 ENCOUNTER — Other Ambulatory Visit: Payer: Self-pay | Admitting: Family

## 2018-12-05 LAB — CMP14+EGFR
ALT: 38 IU/L — ABNORMAL HIGH (ref 0–32)
AST: 25 IU/L (ref 0–40)
Albumin/Globulin Ratio: 1.5 (ref 1.2–2.2)
Albumin: 4.3 g/dL (ref 3.8–4.8)
Alkaline Phosphatase: 104 IU/L (ref 39–117)
BUN/Creatinine Ratio: 11 (ref 9–23)
BUN: 10 mg/dL (ref 6–20)
Bilirubin Total: 0.5 mg/dL (ref 0.0–1.2)
CO2: 25 mmol/L (ref 20–29)
Calcium: 9.9 mg/dL (ref 8.7–10.2)
Chloride: 100 mmol/L (ref 96–106)
Creatinine, Ser: 0.88 mg/dL (ref 0.57–1.00)
GFR calc Af Amer: 99 mL/min/{1.73_m2} (ref >59)
GFR calc non Af Amer: 86 mL/min/{1.73_m2} (ref >59)
Globulin, Total: 2.8 g/dL (ref 1.5–4.5)
Glucose: 130 mg/dL — ABNORMAL HIGH (ref 65–99)
Potassium: 4.5 mmol/L (ref 3.5–5.2)
Sodium: 142 mmol/L (ref 134–144)
Total Protein: 7.1 g/dL (ref 6.0–8.5)

## 2018-12-05 LAB — TSH: TSH: 2.92 u[IU]/mL (ref 0.450–4.500)

## 2018-12-05 LAB — VITAMIN D 25 HYDROXY (VIT D DEFICIENCY, FRACTURES): Vit D, 25-Hydroxy: 23.1 ng/mL — ABNORMAL LOW (ref 30.0–100.0)

## 2018-12-05 MED ORDER — VITAMIN D (ERGOCALCIFEROL) 1.25 MG (50000 UNIT) PO CAPS: ORAL_CAPSULE | ORAL | 3 refills | Status: DC

## 2018-12-18 ENCOUNTER — Other Ambulatory Visit: Payer: Self-pay | Admitting: Endocrinology

## 2018-12-25 ENCOUNTER — Ambulatory Visit: Payer: Medicare Other | Admitting: Endocrinology

## 2018-12-29 ENCOUNTER — Telehealth: Payer: Self-pay | Admitting: Pulmonary Disease

## 2018-12-29 DIAGNOSIS — G4733 Obstructive sleep apnea (adult) (pediatric): Secondary | ICD-10-CM

## 2018-12-29 NOTE — Telephone Encounter (Signed)
Attempted to call pt's mother [name] but unable to reach. Left message for her to return call.

## 2018-12-30 ENCOUNTER — Ambulatory Visit: Payer: Medicare Other | Admitting: Pulmonary Disease

## 2018-12-30 NOTE — Telephone Encounter (Signed)
I have new order and will send when AO has signed it.  Nothing further needed in message.

## 2018-12-30 NOTE — Telephone Encounter (Signed)
I called [employer] to check on status of CPAP machine. The CSR stated that they tried to get in contact with pt to set up CPAP but they never could reach them by phone. They cancelled the order and informed me that we needed to send another order. I placed order to [facility] with a phone number to reach pt. I called pt's mom to let her know what happened and she will await their call. PCC's FYI

## 2019-01-09 ENCOUNTER — Other Ambulatory Visit: Payer: Self-pay

## 2019-01-13 ENCOUNTER — Ambulatory Visit: Payer: Medicare Other | Admitting: Endocrinology

## 2019-01-13 ENCOUNTER — Other Ambulatory Visit: Payer: Self-pay

## 2019-01-13 ENCOUNTER — Telehealth: Payer: Self-pay

## 2019-01-13 MED ORDER — LEVOTHYROXINE SODIUM 137 MCG PO TABS: 137.0000 ug | ORAL_TABLET | Freq: Every day | ORAL | 0 refills | Status: DC

## 2019-01-13 NOTE — Telephone Encounter (Signed)
MEDICATION: levothyroxine (SYNTHROID) 137 MCG tablet  PHARMACY:  [id number] - [place], [place] - [address]  IS THIS A 90 DAY SUPPLY :   IS PATIENT OUT OF MEDICATION:   IF NOT; HOW MUCH IS LEFT:   LAST APPOINTMENT DATE: @12 /17/2020  NEXT APPOINTMENT DATE:@2 /09/2019  DO WE HAVE YOUR PERMISSION TO LEAVE A DETAILED MESSAGE:  OTHER COMMENTS:    **Let patient know to contact pharmacy at the end of the day to make sure medication is ready. **  ** Please notify patient to allow 48-72 hours to process**  **Encourage patient to contact the pharmacy for refills or they can request refills through [facility]**

## 2019-01-13 NOTE — Telephone Encounter (Signed)
Rx sent 

## 2019-01-22 ENCOUNTER — Ambulatory Visit: Payer: Medicare Other | Admitting: Endocrinology

## 2019-02-05 ENCOUNTER — Other Ambulatory Visit: Payer: Self-pay | Admitting: Endocrinology

## 2019-02-10 ENCOUNTER — Other Ambulatory Visit: Payer: Self-pay

## 2019-02-10 ENCOUNTER — Encounter: Payer: Self-pay | Admitting: Endocrinology

## 2019-02-10 ENCOUNTER — Ambulatory Visit (INDEPENDENT_AMBULATORY_CARE_PROVIDER_SITE_OTHER): Payer: Medicare Other | Admitting: Endocrinology

## 2019-02-10 VITALS — BP 110/70 | HR 71 | Ht 59.0 in | Wt 231.4 lb

## 2019-02-10 DIAGNOSIS — E669 Obesity, unspecified: Secondary | ICD-10-CM | POA: Diagnosis not present

## 2019-02-10 DIAGNOSIS — E1169 Type 2 diabetes mellitus with other specified complication: Secondary | ICD-10-CM | POA: Diagnosis not present

## 2019-02-10 DIAGNOSIS — N914 Secondary oligomenorrhea: Secondary | ICD-10-CM

## 2019-02-10 DIAGNOSIS — E890 Postprocedural hypothyroidism: Secondary | ICD-10-CM | POA: Diagnosis not present

## 2019-02-10 LAB — COMPREHENSIVE METABOLIC PANEL
ALT: 44 U/L — ABNORMAL HIGH (ref 0–35)
AST: 28 U/L (ref 0–37)
Albumin: 4.2 g/dL (ref 3.5–5.2)
Alkaline Phosphatase: 79 U/L (ref 39–117)
BUN: 7 mg/dL (ref 6–23)
CO2: 32 mEq/L (ref 19–32)
Calcium: 9.7 mg/dL (ref 8.4–10.5)
Chloride: 102 mEq/L (ref 96–112)
Creatinine, Ser: 0.74 mg/dL (ref 0.40–1.20)
GFR: 89.29 mL/min (ref >60.00)
Glucose, Bld: 101 mg/dL — ABNORMAL HIGH (ref 70–99)
Potassium: 3.9 mEq/L (ref 3.5–5.1)
Sodium: 140 mEq/L (ref 135–145)
Total Bilirubin: 0.4 mg/dL (ref 0.2–1.2)
Total Protein: 7.5 g/dL (ref 6.0–8.3)

## 2019-02-10 LAB — FOLLICLE STIMULATING HORMONE: FSH: 5.2 m[IU]/mL

## 2019-02-10 LAB — TSH: TSH: 1.12 u[IU]/mL (ref 0.35–4.50)

## 2019-02-10 LAB — T4, FREE: Free T4: 1.11 ng/dL (ref 0.60–1.60)

## 2019-02-10 NOTE — Progress Notes (Signed)
Patient ID: Julie Carter, female   DOB: Aug 16, 1985, 34 y.o.   MRN: [id number]    Reason for Appointment:  Endocrinology, followup visit    History of Present Illness:   DIABETES type II  She has had diabetes for several years, previously treated with metformin alone by PCP Jardiance was added for better control in 12/2016 when A1c was 9.3  Current management, blood sugar results and problems identified:   Oral hypoglycemic drugs: Synjardy XR 12.05/998 mg, 2 tablets daily  A1c is 6.5 done by PCP in 12/20, previously 6.3    She has checked her sugars only twice in the last month, using the new Accu-Chek meter given on the last visit  Her mother says that she did not like the discomfort of fingersticks  On her last visit she was advised to cut back on sweets and sweet drinks  Her mother thinks she is doing better with this  However weight has not improved  She is not motivated to do any exercise or walking    Side effects from medications: Some diarrhea from regular Metformin  Wt Readings from Last 3 Encounters:  02/10/19 231 lb 6.4 oz (105 kg)  12/04/18 232 lb (105.2 kg)  09/23/18 231 lb 9.6 oz (105.1 kg)     Lab Results  Component Value Date   HGBA1C 6.5 12/04/2018   HGBA1C 6.3 (A) 09/23/2018   HGBA1C 6.8 06/05/2018   Lab Results  Component Value Date   MICROALBUR <0.7 09/23/2018   [place] 90 09/23/2018   CREATININE 0.88 12/04/2018     HYPOTHYROIDISM  This was  first diagnosed in  2013 after her I-131 treatment for Graves' disease in 08/2011  She did need an increase in her thyroid dosage done after initial therapy was started Difficult to assess her symptoms because of her mental retardation  She may tend to be sleepy during the day at times and has low motivation level         She is taking her levothyroxine consistently before breakfast and is monitored by her mother Not on any iron or calcium-containing vitamins  Her TSH has been  consistently normal, her dose was reduced to 137 mcg instead of 150 in 09/2018 TSH was relatively low at 0.29 Is back to normal as of 12/20 done by PCP She is not complaining of any unusual fatigue Overall feeling a little less lethargic with starting CPAP   Wt Readings from Last 3 Encounters:  02/10/19 231 lb 6.4 oz (105 kg)  12/04/18 232 lb (105.2 kg)  09/23/18 231 lb 9.6 oz (105.1 kg)    Lab Results  Component Value Date   TSH 2.920 12/04/2018   TSH 0.29 (L) 09/23/2018   TSH 0.557 06/05/2018   FREET4 1.29 10/18/2017   FREET4 1.40 06/06/2017   FREET4 1.11 03/25/2017      Allergies as of 02/10/2019   No Known Allergies     Medication List       Accurate as of February 10, 2019 11:08 AM. If you have any questions, ask your nurse or doctor.        glucose blood test strip Use as instructed   levothyroxine 137 MCG tablet Commonly known as: SYNTHROID TAKE 1 TABLET (137 MCG TOTAL) BY MOUTH DAILY BEFORE BREAKFAST.   Synjardy XR 12.05-998 MG Tb24 Generic drug: Empagliflozin-metFORMIN HCl ER TAKE 2 TABLETS BY MOUTH EVERY DAY   Vitamin D (Ergocalciferol) 1.25 MG (50000 UNIT) Caps capsule Commonly known as: DRISDOL  TAKE 1 CAPSULE BY MOUTH EVERY 7 DAYS       Past Medical History:  Diagnosis Date  . Cancer ([place])   . Congenital nystagmus   . Diabetes mellitus   . Down's syndrome   . Heart murmur   . Leukemia [facility])     Past Surgical History:  Procedure Laterality Date  . HERNIA REPAIR     umbilical    Family History  Problem Relation Age of Onset  . Diabetes Mother   . Hypertension Mother   . Hypertension Father     Social History:  reports that she has never smoked. She has never used smokeless tobacco. She reports that she does not drink alcohol or use drugs.  Allergies: No Known Allergies  REVIEW of systems:   She has had Oligomenorrhea for several years of unknown etiology, not have any cycles now and only occasional spotting Previously total  testosterone was normal along with prolactin  No history of hypertension: She is on Jardiance  BP Readings from Last 3 Encounters:  02/10/19 110/70  12/04/18 (!) 130/94  09/23/18 0000000   Diabetes complications: Not complaining of burning in her feet now  She did start treatment for her sleep apnea after recommending evaluation in 9/20   Examination:   BP 110/70 (BP Location: Left Arm, Patient Position: Sitting, Cuff Size: Normal)   Pulse 71   Ht 4\' 11"  (1.499 m)   Wt 231 lb 6.4 oz (105 kg)   SpO2 96%   BMI 46.74 kg/m   She has generalized obesity Alert and cooperative     Assessments   Hypothyroidism, post ablative She is taking 137 mcg daily of the levothyroxine since her visit in 09/2018 when her TSH was low She has taken her levothyroxine regularly  No complaints of fatigue and her TSH is normal as of 12/20 on the new dose  Needs follow-up TSH today also   DIABETES type II with morbid obesity:   On Synjardy maximum doses  A1c is last at a good level at 6.5 in December However still has difficulty losing weight This is despite trying to be better with her diet  Vitamin D deficiency: Being monitored by PCP  Mild increase in ALT: Needs follow-up  Secondary oligomenorrhea: This is not from PCOS and need to rule out premature menopause with [facility] today   Plan:  To start monitoring blood sugars using the Accu-Chek meter at least 3 days a week with some readings after meals including dinner She will start doing indoor aerobic exercises either with exercise videos or music Discussed importance of losing weight for multiple benefits long-term   Labs to be checked today    Elayne Snare 02/10/2019, 11:08 AM   Note: This office note was prepared with Dragon voice recognition system technology. Any transcriptional errors that result from this process are unintentional.

## 2019-03-11 ENCOUNTER — Other Ambulatory Visit: Payer: Self-pay | Admitting: Endocrinology

## 2019-03-25 ENCOUNTER — Other Ambulatory Visit: Payer: Self-pay | Admitting: Endocrinology

## 2019-04-02 DIAGNOSIS — G4733 Obstructive sleep apnea (adult) (pediatric): Secondary | ICD-10-CM | POA: Diagnosis not present

## 2019-04-07 ENCOUNTER — Ambulatory Visit: Payer: Medicare Other | Admitting: Family

## 2019-04-08 ENCOUNTER — Ambulatory Visit (INDEPENDENT_AMBULATORY_CARE_PROVIDER_SITE_OTHER): Payer: Medicare Other | Admitting: Family

## 2019-04-08 ENCOUNTER — Encounter: Payer: Self-pay | Admitting: Family

## 2019-04-08 ENCOUNTER — Other Ambulatory Visit: Payer: Self-pay

## 2019-04-08 VITALS — BP 120/73 | HR 88 | Temp 99.1°F | Ht 59.0 in | Wt 233.6 lb

## 2019-04-08 DIAGNOSIS — Q909 Down syndrome, unspecified: Secondary | ICD-10-CM | POA: Diagnosis not present

## 2019-04-08 DIAGNOSIS — Z6841 Body Mass Index (BMI) 40.0 and over, adult: Secondary | ICD-10-CM

## 2019-04-08 DIAGNOSIS — E559 Vitamin D deficiency, unspecified: Secondary | ICD-10-CM

## 2019-04-08 DIAGNOSIS — E890 Postprocedural hypothyroidism: Secondary | ICD-10-CM

## 2019-04-08 DIAGNOSIS — Z840 Family history of diseases of the skin and subcutaneous tissue: Secondary | ICD-10-CM | POA: Diagnosis not present

## 2019-04-08 DIAGNOSIS — E1169 Type 2 diabetes mellitus with other specified complication: Secondary | ICD-10-CM

## 2019-04-08 DIAGNOSIS — Z23 Encounter for immunization: Secondary | ICD-10-CM

## 2019-04-08 LAB — CBC WITH DIFFERENTIAL/PLATELET
Basophils Absolute: 0.1 10*3/uL (ref 0.0–0.2)
Basos: 1 %
EOS (ABSOLUTE): 0.1 10*3/uL (ref 0.0–0.4)
Eos: 1 %
Hematocrit: 41.1 % (ref 34.0–46.6)
Hemoglobin: 13.7 g/dL (ref 11.1–15.9)
Immature Grans (Abs): 0 10*3/uL (ref 0.0–0.1)
Immature Granulocytes: 0 %
Lymphocytes Absolute: 1.2 10*3/uL (ref 0.7–3.1)
Lymphs: 18 %
MCH: 31.7 pg (ref 26.6–33.0)
MCHC: 33.3 g/dL (ref 31.5–35.7)
MCV: 95 fL (ref 79–97)
Monocytes Absolute: 0.5 10*3/uL (ref 0.1–0.9)
Monocytes: 7 %
Neutrophils Absolute: 4.8 10*3/uL (ref 1.4–7.0)
Neutrophils: 73 %
Platelets: 305 10*3/uL (ref 150–450)
RBC: 4.32 x10E6/uL (ref 3.77–5.28)
RDW: 14.9 % (ref 11.7–15.4)
WBC: 6.7 10*3/uL (ref 3.4–10.8)

## 2019-04-08 LAB — CMP14+EGFR
ALT: 43 IU/L — ABNORMAL HIGH (ref 0–32)
AST: 24 IU/L (ref 0–40)
Albumin/Globulin Ratio: 1.6 (ref 1.2–2.2)
Albumin: 3.9 g/dL (ref 3.8–4.8)
Alkaline Phosphatase: 85 IU/L (ref 39–117)
BUN/Creatinine Ratio: 13 (ref 9–23)
BUN: 11 mg/dL (ref 6–20)
Bilirubin Total: 0.4 mg/dL (ref 0.0–1.2)
CO2: 24 mmol/L (ref 20–29)
Calcium: 9.6 mg/dL (ref 8.7–10.2)
Chloride: 104 mmol/L (ref 96–106)
Creatinine, Ser: 0.84 mg/dL (ref 0.57–1.00)
GFR calc Af Amer: 104 mL/min/{1.73_m2} (ref >59)
GFR calc non Af Amer: 90 mL/min/{1.73_m2} (ref >59)
Globulin, Total: 2.5 g/dL (ref 1.5–4.5)
Glucose: 132 mg/dL — ABNORMAL HIGH (ref 65–99)
Potassium: 4.2 mmol/L (ref 3.5–5.2)
Sodium: 143 mmol/L (ref 134–144)
Total Protein: 6.4 g/dL (ref 6.0–8.5)

## 2019-04-08 LAB — BAYER DCA HB A1C WAIVED: HB A1C (BAYER DCA - WAIVED): 6.8 % (ref <7.0)

## 2019-04-08 MED ORDER — LEVOTHYROXINE SODIUM 137 MCG PO TABS: 137.0000 ug | ORAL_TABLET | Freq: Every day | ORAL | 2 refills | Status: DC

## 2019-04-08 MED ORDER — VITAMIN D (ERGOCALCIFEROL) 1.25 MG (50000 UNIT) PO CAPS: ORAL_CAPSULE | ORAL | 3 refills | Status: DC

## 2019-04-08 MED ORDER — SYNJARDY XR 12.5-1000 MG PO TB24: 2.0000 | ORAL_TABLET | Freq: Every day | ORAL | 2 refills | Status: DC

## 2019-04-08 NOTE — Progress Notes (Signed)
Subjective:    Patient ID: Julie Carter, female    DOB: 17-Dec-1985, 34 y.o.   MRN: [id number]  Chief Complaint  Patient presents with  . Medical Management of Chronic Issues   Pt presents to the office today for chronic follow up. She is followed by Endocrinologists.   Diabetes She presents for her follow-up diabetic visit. She has type 2 diabetes mellitus. Her disease course has been stable. There are no hypoglycemic associated symptoms. Pertinent negatives for diabetes include no blurred vision and no fatigue. Symptoms are stable. Pertinent negatives for diabetic complications include no CVA, heart disease, nephropathy or peripheral neuropathy. Risk factors for coronary artery disease include dyslipidemia, diabetes mellitus, hypertension, sedentary lifestyle and post-menopausal. She is following a generally unhealthy diet. (Does not check BS at home ) Eye exam is not current.  Thyroid Problem Presents for follow-up visit. Symptoms include dry skin. Patient reports no constipation, diaphoresis, diarrhea or fatigue. The symptoms have been stable. Her past medical history is significant for hyperlipidemia.  Hyperlipidemia This is a chronic problem. The current episode started more than 1 year ago. The problem is controlled. Recent lipid tests were reviewed and are normal. Exacerbating diseases include hypothyroidism. Current antihyperlipidemic treatment includes diet change. The current treatment provides mild improvement of lipids. Risk factors for coronary artery disease include dyslipidemia, diabetes mellitus, hypertension, a sedentary lifestyle and post-menopausal.      Review of Systems  Constitutional: Negative for diaphoresis and fatigue.  Eyes: Negative for blurred vision.  Gastrointestinal: Negative for constipation and diarrhea.  All other systems reviewed and are negative.      Objective:   Physical Exam Vitals reviewed.  Constitutional:      General: She is not in  acute distress.    Appearance: She is well-developed. She is obese.  HENT:     Head: Normocephalic and atraumatic.     Right Ear: Tympanic membrane normal.     Left Ear: Tympanic membrane normal.  Eyes:     Pupils: Pupils are equal, round, and reactive to light.  Neck:     Thyroid: No thyromegaly.  Cardiovascular:     Rate and Rhythm: Normal rate and regular rhythm.     Heart sounds: Normal heart sounds. No murmur.  Pulmonary:     Effort: Pulmonary effort is normal. No respiratory distress.     Breath sounds: Normal breath sounds. No wheezing.  Abdominal:     General: Bowel sounds are normal. There is no distension.     Palpations: Abdomen is soft.     Tenderness: There is no abdominal tenderness.  Musculoskeletal:        General: No tenderness. Normal range of motion.     Cervical back: Normal range of motion and neck supple.  Skin:    General: Skin is warm and dry.     Comments: Face erythemas and dry  Neurological:     Mental Status: She is alert and oriented to person, place, and time.     Cranial Nerves: No cranial nerve deficit.     Deep Tendon Reflexes: Reflexes are normal and symmetric.  Psychiatric:        Behavior: Behavior is withdrawn.        Thought Content: Thought content normal.        Judgment: Judgment normal.       BP 120/73   Pulse 88   Temp 99.1 F (37.3 C) (Temporal)   Ht 4' 11"  (1.499 m)   Wt 233  lb 9.6 oz (106 kg)   SpO2 97%   BMI 47.18 kg/m      Assessment & Plan:  IRVIN LIZAMA comes in today with chief complaint of Medical Management of Chronic Issues   Diagnosis and orders addressed:  1. Type 2 diabetes mellitus with other specified complication, without long-term current use of insulin (HCC) - Empagliflozin-metFORMIN HCl ER (SYNJARDY XR) 12.05-998 MG TB24; Take 2 tablets by mouth daily.  Dispense: 60 tablet; Refill: 2 - Bayer DCA Hb A1c Waived - CMP14+EGFR - CBC with Differential/Platelet  2. Hypothyroidism, postradioiodine  therapy - levothyroxine (SYNTHROID) 137 MCG tablet; Take 1 tablet (137 mcg total) by mouth daily before breakfast.  Dispense: 30 tablet; Refill: 2 - CMP14+EGFR - CBC with Differential/Platelet  3. Down syndrome - CMP14+EGFR - CBC with Differential/Platelet  4. Morbid obesity with BMI of 50.0-59.9, adult (HCC) - CMP14+EGFR - CBC with Differential/Platelet  5. Vitamin D deficiency - Vitamin D, Ergocalciferol, (DRISDOL) 1.25 MG (50000 UNIT) CAPS capsule; TAKE 1 CAPSULE BY MOUTH EVERY 7 DAYS  Dispense: 12 capsule; Refill: 3 - CMP14+EGFR - CBC with Differential/Platelet  6. Family history of discoid lupus erythematosus - CMP14+EGFR - ANA Comprehensive Panel  7. Need for Tdap vaccination - Tdap vaccine greater than or equal to 7yo IM - CMP14+EGFR - CBC with Differential/Platelet   Labs pending Health Maintenance reviewed Diet and exercise encouraged- discussed importance of weight loss and exercising. Very low motivation.   Follow up plan: 6 months and keep specialists with Endo   Evelina Dun, FNP

## 2019-04-08 NOTE — Patient Instructions (Signed)

## 2019-04-09 LAB — ANA COMPREHENSIVE PANEL
Anti JO-1: <0.2 AI (ref 0.0–0.9)
Centromere Ab Screen: <0.2 AI (ref 0.0–0.9)
Chromatin Ab SerPl-aCnc: <0.2 AI (ref 0.0–0.9)
ENA RNP Ab: <0.2 AI (ref 0.0–0.9)
ENA SM Ab Ser-aCnc: <0.2 AI (ref 0.0–0.9)
ENA SSA (RO) Ab: 0.2 AI (ref 0.0–0.9)
ENA SSB (LA) Ab: <0.2 AI (ref 0.0–0.9)
Scleroderma (Scl-70) (ENA) Antibody, IgG: <0.2 AI (ref 0.0–0.9)
dsDNA Ab: <1 [IU]/mL (ref 0–9)

## 2019-04-17 ENCOUNTER — Telehealth: Payer: Self-pay | Admitting: Family

## 2019-04-17 NOTE — Chronic Care Management (AMB) (Signed)
  Chronic Care Management   Note  04/17/2019 Name: LAKINDRA WIBLE MRN: [id number] DOB: [birth year]  Julie Carter is a 34 y.o. year old female who is a primary care patient of [name], FNP. I reached out to Julie Carter by phone today in response to a referral sent by Ms. Maud Deed Vallie's health plan.     Ms. Larsen Mother [name] was given information about Chronic Care Management services today including:  1. CCM service includes personalized support from designated clinical staff supervised by her physician, including individualized plan of care and coordination with other care providers 2. 24/7 contact phone numbers for assistance for urgent and routine care needs. 3. Service will only be billed when office clinical staff spend 20 minutes or more in a month to coordinate care. 4. Only one practitioner may furnish and bill the service in a calendar month. 5. The patient may stop CCM services at any time (effective at the end of the month) by phone call to the office staff. 6. The patient will be responsible for cost sharing (co-pay) of up to 20% of the service fee (after annual deductible is met).  Patient's Mother [name] agreed to services and verbal consent obtained.   Follow up plan: Telephone appointment with care management team member scheduled for:05/04/2019  Noreene Larsson, [place], [place], [place] [address] Direct Dial: [phone] [email or web address] Website: [email or web address]

## 2019-05-04 ENCOUNTER — Ambulatory Visit (INDEPENDENT_AMBULATORY_CARE_PROVIDER_SITE_OTHER): Payer: Medicare Other | Admitting: Licensed Clinical Social Worker

## 2019-05-04 DIAGNOSIS — E1169 Type 2 diabetes mellitus with other specified complication: Secondary | ICD-10-CM

## 2019-05-04 DIAGNOSIS — E890 Postprocedural hypothyroidism: Secondary | ICD-10-CM

## 2019-05-04 DIAGNOSIS — E559 Vitamin D deficiency, unspecified: Secondary | ICD-10-CM

## 2019-05-04 DIAGNOSIS — Q909 Down syndrome, unspecified: Secondary | ICD-10-CM

## 2019-05-04 NOTE — Chronic Care Management (AMB) (Addendum)
  Chronic Care Management    Clinical Social Work Follow Up Note  05/04/2019 Name: KEZIAH BARASCH MRN: [id number] DOB: February 19, 1985  Julie Carter is a 34 y.o. year old female who is a primary care patient of [name], FNP. The CCM team was consulted for assistance with [name]   Review of patient status, including review of consultants reports, other relevant assessments, and collaboration with appropriate care team members and the patient's provider was performed as part of comprehensive patient evaluation and provision of chronic care management services.    SDOH (Social Determinants of Health) assessments performed: Yes; risk for social isolation ; risk for depression; risk for physical inactivity   Outpatient Encounter Medications as of 05/04/2019  Medication Sig   Empagliflozin-metFORMIN HCl ER (SYNJARDY XR) 12.05-998 MG TB24 Take 2 tablets by mouth daily.   glucose blood test strip Use as instructed   levothyroxine (SYNTHROID) 137 MCG tablet Take 1 tablet (137 mcg total) by mouth daily before breakfast.   Vitamin D, Ergocalciferol, (DRISDOL) 1.25 MG (50000 UNIT) CAPS capsule TAKE 1 CAPSULE BY MOUTH EVERY 7 DAYS   No facility-administered encounter medications on file as of 05/04/2019.    Goals Addressed             This Visit's Progress    Client and mother will talk with LCSW in next 30 days about ADLs completion for client (pt-stated)       CARE PLAN ENTRY  Current Barriers:  Down's syndrome in patient with Chronic Diagnoses of Hypothyroidism, DM, Vitamin D deficiency Needs help with ADLs  Clinical Social Work Clinical Goal(s):  LCSW will talk with client/mother of client in next 30 days about ADLs completion for client  Interventions: Encouraged client and mother to call RNCM as needed for nursing support for client  Talked with [name] of client about CCM program Talked with mother of  client about OSA of client (mother said client uses  a C-Pap machine regularly) Talked with [name] about pain issues of client Talked with [name] Talked with [name] Talked with [name] about communication ability of client Talked with [name] Talked with [name] (client likes to watch videos, likes to color or do art drawings, likes music and music videos, watches TV)  Patient Self Care Activities:  Eats meals with set up assistance  Patient Self Care Deficits  Patient has Down's Syndrome Needs help with ADLs Needs transport help Needs medication assistance  Initial goal documentation        Follow Up Plan: LCSW to call client/[name], mother of client, in next 4 weeks to talk about ADLs completion of client  [name].Banner Huckaba MSW, LCSW Licensed Clinical Social Worker [facility] Care Management [phone]   I have reviewed and agree with the above  documentation.    Evelina Dun, FNP

## 2019-05-04 NOTE — Patient Instructions (Addendum)
Licensed Clinical Social Worker Visit Information  Goals we discussed today:  Goals Addressed            This Visit's Progress   . Client and mother will talk with LCSW in next 30 days about ADLs completion for client (pt-stated)       [place] (see longitudinal plan of care for additional care plan information)  Current Barriers:  . Down's syndrome in patient with Chronic Diagnoses of Hypothyroidism, DM, Vitamin D . deficiency . Needs help with ADLs  Clinical Social Work Clinical Goal(s):  [name] LCSW will talk with client/mother of client in next 30 days about ADLs completion for client  Interventions: . Encouraged client and mother to call RNCM as needed for nursing support for client  . Talked with [name] of client about CCM program . Talked with mother of  client about OSA of client (mother said client uses a C-Pap machine regularly) . Talked with [name] about pain issues of client . Talked with [name] . Talked with [name] . Talked with [name] about communication ability of client . Talked with [name] . Talked with [name] (client likes to watch videos, likes to color or do art drawings, likes music and music videos, watches TV)  Patient Self Care Activities:  Eats meals with set up assistance  Patient Self Care Deficits  Patient has Down's Syndrome Needs help with ADLs Needs transport help Needs medication assistance  Initial goal documentation      Materials Provided: No  Follow Up Plan: LCSW to call client/[name], mother of client, in next 4 weeks to talk about ADLs completion of client  The patient/[name], mother of patient, verbalized understanding of instructions provided today and declined a print copy of patient instruction materials.   [name].Angellee Cohill MSW, LCSW Licensed Clinical Social  Worker [facility] Care Management [phone]

## 2019-05-11 DIAGNOSIS — G4733 Obstructive sleep apnea (adult) (pediatric): Secondary | ICD-10-CM | POA: Diagnosis not present

## 2019-06-03 ENCOUNTER — Ambulatory Visit (INDEPENDENT_AMBULATORY_CARE_PROVIDER_SITE_OTHER): Payer: Medicare Other | Admitting: Licensed Clinical Social Worker

## 2019-06-03 DIAGNOSIS — E890 Postprocedural hypothyroidism: Secondary | ICD-10-CM

## 2019-06-03 DIAGNOSIS — E1169 Type 2 diabetes mellitus with other specified complication: Secondary | ICD-10-CM

## 2019-06-03 DIAGNOSIS — Q909 Down syndrome, unspecified: Secondary | ICD-10-CM

## 2019-06-03 DIAGNOSIS — E559 Vitamin D deficiency, unspecified: Secondary | ICD-10-CM

## 2019-06-03 NOTE — Chronic Care Management (AMB) (Addendum)
  Chronic Care Management    Clinical Social Work Follow Up Note  06/03/2019 Name: Julie Carter MRN: [id number] DOB: 06/24/1985  Julie Carter is a 34 y.o. year old female who is a primary care patient of Julie Balloon, FNP. The CCM team was consulted for assistance with Julie Carter .   Review of patient status, including review of consultants reports, other relevant assessments, and collaboration with appropriate care team members and the patient's provider was performed as part of comprehensive patient evaluation and provision of chronic care management services.    SDOH (Social Determinants of Health) assessments performed: Yes;risk for social isolation; risk for depression; risk for physical inactivity   Outpatient Encounter Medications as of 06/03/2019  Medication Sig   Empagliflozin-metFORMIN HCl ER (SYNJARDY XR) 12.05-998 MG TB24 Take 2 tablets by mouth daily.   glucose blood test strip Use as instructed   levothyroxine (SYNTHROID) 137 MCG tablet Take 1 tablet (137 mcg total) by mouth daily before breakfast.   Vitamin D, Ergocalciferol, (DRISDOL) 1.25 MG (50000 UNIT) CAPS capsule TAKE 1 CAPSULE BY MOUTH EVERY 7 DAYS   No facility-administered encounter medications on file as of 06/03/2019.    Goals Addressed             This Visit's Progress    Client and mother will talk with LCSW in next 30 days about ADLs completion for client (pt-stated)       CARE PLAN ENTRY  Current Barriers:  Down's syndrome in patient with chronic diagnoses of Down's syndrome, DM, Vitamin D deficiency, morbid obesity Needs help with ADLs  Clinical Social Work Clinical Goal(s):  LCSW will talk with client/mother of client in next 30 days about ADLs completion for client  Interventions: Encouraged client and mother to call RNCM as needed for nursing support for client  Talked with [name] of client about CCM program Talked with mother of  client about OSA of client (mother  said client uses a C-Pap machine regularly) Talked with [name] about pain issues of client Talked with [name] Talked with [name] Talked with [name] about communication ability of client Talked with [name] Talked with [name] (client likes to watch videos, likes to color or do art drawings, likes music and music videos, watches TV) Talked with [name] about client completion of ADLs Talked witi [name] about client social support network (mother is supportive; sister of client is supportive) Talked with [name] about ambulation challenges of client   Patient Self Care Activities:  Eats meals with set up assistance  Patient Self Care Deficits  Patient has Down's Syndrome Needs help with ADLs Needs transport help Needs medication assistance  Initial goal documentation         Follow Up Plan: LCSW to call client/Julie Carter, mother of client, in next 4 weeks to talk about ADLs completion of client  Julie Carter MSW, LCSW Licensed Clinical Social Worker [facility] [phone]  I have reviewed and agree with the above  documentation.   Evelina Dun, FNP

## 2019-06-03 NOTE — Patient Instructions (Addendum)
Licensed Clinical Social Worker Visit Information  Goals we discussed today:  Goals Addressed            This Visit's Progress   . Client and mother will talk with LCSW in next 30 days about ADLs completion for client (pt-stated)       CARE PLAN ENTRY  Current Barriers:  . Down's syndrome in patient with chronic diagnoses of Down's syndrome, DM, Vitamin D deficiency, morbid obesity . Needs help with ADLs  Clinical Social Work Clinical Goal(s):  [name] LCSW will talk with client/mother of client in next 30 days about ADLs completion for client  Interventions: . Encouraged client and mother to call RNCM as needed for nursing support for client  . Talked with [name] of client about CCM program . Talked with mother of  client about OSA of client (mother said client uses a C-Pap machine regularly) . Talked with Julie Carter about pain issues of client . Talked with [name] . Talked with [name] . Talked with [name] about communication ability of client . Talked with [name] . Talked with [name] (client likes to watch videos, likes to color or do art drawings, likes music and music videos, watches TV)  Talked with [name] about client completion of ADLs  Talked witi [name] about client social support network (mother is supportive; sister of client is supportive)  Talked with [name] about ambulation challenges of client  Patient Self Care Activities:  Eats meals with set up assistance  Patient Self Care Deficits  Patient has Down's Syndrome Needs help with ADLs Needs transport help Needs medication assistance  Initial goal documentation       Materials Provided: No  Follow Up Plan: LCSW to call client/Julie Carter, mother of client, in next 4 weeks to talk about ADLs completion of client  The patient Julie Carter, mother of client,  verbalized understanding of instructions provided today and declined a print copy of patient instruction materials.   [name].Joycelyn Liska MSW, LCSW Licensed Clinical Social Worker [facility] [phone]

## 2019-06-10 ENCOUNTER — Ambulatory Visit: Payer: Medicare Other | Admitting: Endocrinology

## 2019-06-11 DIAGNOSIS — G4733 Obstructive sleep apnea (adult) (pediatric): Secondary | ICD-10-CM | POA: Diagnosis not present

## 2019-07-08 ENCOUNTER — Telehealth: Payer: Self-pay

## 2019-07-15 ENCOUNTER — Other Ambulatory Visit: Payer: Self-pay

## 2019-07-15 ENCOUNTER — Telehealth: Payer: Self-pay | Admitting: Endocrinology

## 2019-07-15 DIAGNOSIS — E890 Postprocedural hypothyroidism: Secondary | ICD-10-CM

## 2019-07-15 MED ORDER — LEVOTHYROXINE SODIUM 137 MCG PO TABS: 137.0000 ug | ORAL_TABLET | Freq: Every day | ORAL | 2 refills | Status: DC

## 2019-07-15 NOTE — Telephone Encounter (Signed)
Medication Refill Request  Did you call your pharmacy and request this refill first? Yes  . If patient has not contacted pharmacy first, instruct them to do so for future refills.  . Remind them that contacting the pharmacy for their refill is the quickest method to get the refill.  . Refill policy also stated that it will take anywhere between 24-72 hours to receive the refill.    Name of medication? levothyroxine  Is this a 90 day supply? yes  Name and location of pharmacy?  [id number] - [place], [place]  [address] [place], [place] [address]  Phone:  [phone] Fax:  [phone]

## 2019-07-15 NOTE — Telephone Encounter (Signed)
Rx sent 

## 2019-07-28 ENCOUNTER — Ambulatory Visit: Payer: Medicare Other | Admitting: Endocrinology

## 2019-08-10 ENCOUNTER — Ambulatory Visit (INDEPENDENT_AMBULATORY_CARE_PROVIDER_SITE_OTHER): Payer: Medicare Other | Admitting: Licensed Clinical Social Worker

## 2019-08-10 ENCOUNTER — Ambulatory Visit (INDEPENDENT_AMBULATORY_CARE_PROVIDER_SITE_OTHER): Payer: Medicare Other | Admitting: Endocrinology

## 2019-08-10 ENCOUNTER — Encounter: Payer: Self-pay | Admitting: Endocrinology

## 2019-08-10 ENCOUNTER — Other Ambulatory Visit: Payer: Self-pay

## 2019-08-10 VITALS — BP 118/72 | HR 89 | Ht 59.0 in | Wt 231.0 lb

## 2019-08-10 DIAGNOSIS — E282 Polycystic ovarian syndrome: Secondary | ICD-10-CM | POA: Diagnosis not present

## 2019-08-10 DIAGNOSIS — E1169 Type 2 diabetes mellitus with other specified complication: Secondary | ICD-10-CM

## 2019-08-10 DIAGNOSIS — Q909 Down syndrome, unspecified: Secondary | ICD-10-CM

## 2019-08-10 DIAGNOSIS — E559 Vitamin D deficiency, unspecified: Secondary | ICD-10-CM

## 2019-08-10 DIAGNOSIS — E669 Obesity, unspecified: Secondary | ICD-10-CM

## 2019-08-10 DIAGNOSIS — E890 Postprocedural hypothyroidism: Secondary | ICD-10-CM

## 2019-08-10 DIAGNOSIS — Z6841 Body Mass Index (BMI) 40.0 and over, adult: Secondary | ICD-10-CM

## 2019-08-10 LAB — POCT GLUCOSE (DEVICE FOR HOME USE): POC Glucose: 138 mg/dl — AB (ref 70–99)

## 2019-08-10 LAB — POCT GLYCOSYLATED HEMOGLOBIN (HGB A1C): Hemoglobin A1C: 6.3 % — AB (ref 4.0–5.6)

## 2019-08-10 NOTE — Patient Instructions (Signed)
Check blood sugars on waking up 2 days a week  Also check blood sugars about 2 hours after meals and do this after different meals by rotation  Recommended blood sugar levels on waking up are 90-130 and about 2 hours after meal is 130-160  Please bring your blood sugar monitor to each visit, thank you  Walk daily    

## 2019-08-10 NOTE — Progress Notes (Signed)
Patient ID: Julie Carter, female   DOB: April 18, 1985, 34 y.o.   MRN: [id number]    Reason for Appointment:  Endocrinology, followup visit    History of Present Illness:   DIABETES type II  She has had diabetes for several years, previously treated with metformin alone by PCP Jardiance was added for better control in 12/2016 when A1c was 9.3  Current management, blood sugar results and problems identified:   Oral hypoglycemic drugs: Synjardy XR 12.05/998 mg, 2 tablets daily  A1c is better at 6.3    She has not checked her sugars for quite some time even though she has an Accu-Chek meter  She does not like the discomfort of fingersticks  Diet is not consistent and sometimes will eat foods like pizza, has been told to cut back on regular soft drinks also  Her mother thinks she is doing better with physical activity with a little walking and helping her at work  However weight has not significantly Nonfasting blood sugar 138 today    Side effects from medications: Some diarrhea from regular Metformin  Wt Readings from Last 3 Encounters:  08/10/19 231 lb (104.8 kg)  04/08/19 233 lb 9.6 oz (106 kg)  02/10/19 231 lb 6.4 oz (105 kg)     Lab Results  Component Value Date   HGBA1C 6.3 (A) 08/10/2019   HGBA1C 6.8 04/08/2019   HGBA1C 6.5 12/04/2018   Lab Results  Component Value Date   MICROALBUR <0.7 09/23/2018   [place] 90 09/23/2018   CREATININE 0.84 04/08/2019     HYPOTHYROIDISM  This was  first diagnosed in  2013 after her I-131 treatment for Graves' disease in 08/2011  She did need an increase in her thyroid dosage done after initial therapy was started Difficult to assess her symptoms because of her mental retardation  She may tend to be sleepy during the day at times and has low motivation level         She is taking her levothyroxine consistently before breakfast and is monitored by her mother Not on any iron or calcium-containing  vitamins  Her TSH has been consistently normal, her dose was reduced to 137 mcg instead of 150 in 09/2018 TSH was relatively low at 0.29 Is back to normal as of 12/20 done by PCP She is not complaining of any unusual fatigue Overall feeling a little less lethargic with starting CPAP   Wt Readings from Last 3 Encounters:  08/10/19 231 lb (104.8 kg)  04/08/19 233 lb 9.6 oz (106 kg)  02/10/19 231 lb 6.4 oz (105 kg)    Lab Results  Component Value Date   TSH 1.12 02/10/2019   TSH 2.920 12/04/2018   TSH 0.29 (L) 09/23/2018   FREET4 1.11 02/10/2019   FREET4 1.29 10/18/2017   FREET4 1.40 06/06/2017      Allergies as of 08/10/2019   No Known Allergies     Medication List       Accurate as of August 10, 2019  9:00 PM. If you have any questions, ask your nurse or doctor.        glucose blood test strip Use as instructed   levothyroxine 137 MCG tablet Commonly known as: SYNTHROID Take 1 tablet (137 mcg total) by mouth daily before breakfast.   Synjardy XR 12.05-998 MG Tb24 Generic drug: Empagliflozin-metFORMIN HCl ER Take 2 tablets by mouth daily.   Vitamin D (Ergocalciferol) 1.25 MG (50000 UNIT) Caps capsule Commonly known as: DRISDOL TAKE 1  CAPSULE BY MOUTH EVERY 7 DAYS       Past Medical History:  Diagnosis Date  . Cancer ([place])   . Congenital nystagmus   . Diabetes mellitus   . Down's syndrome   . Heart murmur   . Leukemia [facility])     Past Surgical History:  Procedure Laterality Date  . HERNIA REPAIR     umbilical    Family History  Problem Relation Age of Onset  . Diabetes Mother   . Hypertension Mother   . Hypertension Father     Social History:  reports that she has never smoked. She has never used smokeless tobacco. She reports that she does not drink alcohol and does not use drugs.  Allergies: No Known Allergies  REVIEW of systems:   She has had Oligomenorrhea for several years of unknown etiology, has only occasional spotting No  hirsutism  Previously free testosterone was relatively high along with normal prolactin, drawn late morning FSH normal in 2/21  Lab Results  Component Value Date   TESTOFREE 4.5 (H) 06/06/2017     No history of hypertension: She is on Jardiance  BP Readings from Last 3 Encounters:  08/10/19 118/72  04/08/19 120/73  02/10/19 244/62   Diabetes complications: Not complaining of burning in her feet now  She did start treatment for her sleep apnea after recommending evaluation in 9/20 but using CPAP regularly   Examination:   BP 118/72 (BP Location: Left Arm, Patient Position: Sitting, Cuff Size: Normal)   Pulse 89   Ht 4\' 11"  (1.499 m)   Wt 231 lb (104.8 kg)   SpO2 96%   BMI 46.66 kg/m   She has generalized obesity Alert and cooperative     Assessments   Hypothyroidism, post ablative She is taking 137 mcg daily of the levothyroxine since her visit in 09/2018 when her TSH was low She has taken her levothyroxine regularly in the mornings Subsequently TSH has been normal  Needs follow-up TSH today    DIABETES type II with morbid obesity:   On Synjardy maximum doses  A1c is fairly good at 6.3 Again still has difficulty losing weight Not doing enough exercise   Secondary oligomenorrhea: This is from PCOS, not improved with Metformin clinically   Plan:  To start monitoring blood sugars at home least 3 days a week with some readings after meals including dinner She will need to increase her walking for exercise  Consider adding Rybelsus for weight loss especially with her having PCOS also   Labs to be checked today Follow-up in 6 months unless labs are normal   Elayne Snare 08/10/2019, 9:00 PM   Note: This office note was prepared with Dragon voice recognition system technology. Any transcriptional errors that result from this process are unintentional.

## 2019-08-10 NOTE — Chronic Care Management (AMB) (Addendum)
  Chronic Care Management    Clinical Social Work Follow Up Note  08/10/2019 Name: Julie Carter MRN: [id number] DOB: 1985-01-23  Trecia Carter is a 34 y.o. year old female who is a primary care patient of [name], FNP. The CCM team was consulted for assistance with Community resources.   Review of patient status, including review of consultants reports, other relevant assessments, and collaboration with appropriate care team members and the patient's provider was performed as part of comprehensive patient evaluation and provision of chronic care management services.    SDOH (Social Determinants of Health) assessments performed:  No; risk for tobacco use; risk for depression ;risk for transport needs;    Outpatient Encounter Medications as of 08/10/2019  Medication Sig   Empagliflozin-metFORMIN HCl ER (SYNJARDY XR) 12.05-998 MG TB24 Take 2 tablets by mouth daily.   glucose blood test strip Use as instructed   levothyroxine (SYNTHROID) 137 MCG tablet Take 1 tablet (137 mcg total) by mouth daily before breakfast.   Vitamin D, Ergocalciferol, (DRISDOL) 1.25 MG (50000 UNIT) CAPS capsule TAKE 1 CAPSULE BY MOUTH EVERY 7 DAYS   No facility-administered encounter medications on file as of 08/10/2019.    Goals       Client and mother will talk with LCSW in next 30 days about ADLs completion for client (pt-stated)      CARE PLAN ENTRY  Current Barriers:  Down's syndrome in patient with chronic diagnoses of Down's syndrome, DM, Vitamin D deficiency, morbid obesity Needs help with ADLs  Clinical Social Work Clinical Goal(s):  LCSW will talk with client/mother of client in next 30 days about ADLs completion for client  Interventions: Encouraged client and mother to call RNCM as needed for nursing support for client  Talked with mother of  client about OSA of client (mother said client uses a C-Pap machine regularly) Talked with [name] about pain issues of client Talked with  [name] Talked with [name] for client Talked with [name] about communication ability of client Talked with [name] Talked with [name] (client likes to watch videos, likes to color or do art drawings, likes music and music videos, watches TV) Talked with [name] about vision needs of client Talked with [name] about client appointment today with endocrinologist Talked with [name] about CCM program support for client  Patient Self Care Activities:  Eats meals with set up assistance  Patient Self Care Deficits  Patient has Down's Syndrome Needs help with ADLs Needs transport help Needs medication assistance  Initial goal documentation       Follow Up Plan: LCSW to call client/Julie Carter, mother of client, in next 4 weeks to talk about ADLs completion of client  [name].Odas Ozer MSW, LCSW Licensed Clinical Social Worker [facility] [phone]  I have reviewed the CCM documentation and agree with the written assessment and plan of care.  Evelina Dun, FNP

## 2019-08-10 NOTE — Patient Instructions (Addendum)
Licensed Clinical Education officer, museum Visit Information  Goals we discussed today:     Client and mother will talk with LCSW in next 30 days about ADLs completion for client (pt-stated)          CARE PLAN ENTRY  Current Barriers:   Down's syndrome in patient with chronic diagnoses of Down's syndrome, DM, Vitamin D deficiency, morbid obesity  Needs help with ADLs  Clinical Social Work Clinical Goal(s):   LCSW will talk with client/mother of client in next 30 days about ADLs completion for client  Interventions:  Encouraged client and mother to call RNCM as needed for nursing support for client   Talked with mother of  client about OSA of client (mother said client uses a C-Pap machine regularly)  Talked with [name] about pain issues of client  Talked with [name]  Talked with [name] for client  Talked with [name] about communication ability of client  Talked with [name]  Talked with [name] (client likes to watch videos, likes to color or do art drawings, likes music and music videos, watches TV)  Talked with [name] about vision needs of client  Talked with [name] about client appointment today with endocrinologist  Talked with [name] about CCM program support for client  Patient Self Care Activities:  Eats meals with set up assistance  Patient Self Care Deficits  Patient has Down's Syndrome Needs help with ADLs Needs transport help Needs medication assistance  Initial goal documentation      Follow Up Plan: LCSW to call client/[name], mother of client, in next 4 weeks to talk about ADLs completion of client  Materials Provided: No  The patient/[name], mother of patient,  verbalized understanding of instructions provided today and declined a print copy of patient instruction materials.   [name].Tieler Cournoyer MSW,  LCSW Licensed Clinical Social Worker [facility] [phone]

## 2019-08-11 LAB — BASIC METABOLIC PANEL
BUN: 11 mg/dL (ref 6–23)
CO2: 28 mEq/L (ref 19–32)
Calcium: 10.1 mg/dL (ref 8.4–10.5)
Chloride: 103 mEq/L (ref 96–112)
Creatinine, Ser: 0.85 mg/dL (ref 0.40–1.20)
GFR: 75.87 mL/min (ref >60.00)
Glucose, Bld: 101 mg/dL — ABNORMAL HIGH (ref 70–99)
Potassium: 4.1 mEq/L (ref 3.5–5.1)
Sodium: 141 mEq/L (ref 135–145)

## 2019-08-11 LAB — T4, FREE: Free T4: 1.11 ng/dL (ref 0.60–1.60)

## 2019-08-11 LAB — TSH: TSH: 2.96 u[IU]/mL (ref 0.35–4.50)

## 2019-08-17 NOTE — Progress Notes (Signed)
Please call to let patient know that the lab results are normal and no further action needed

## 2019-08-21 ENCOUNTER — Telehealth: Payer: Self-pay | Admitting: Family

## 2019-08-24 NOTE — Telephone Encounter (Signed)
APPOINTMENT SCHEDULED

## 2019-09-01 ENCOUNTER — Encounter: Payer: Self-pay | Admitting: Nurse Practitioner

## 2019-09-01 ENCOUNTER — Ambulatory Visit (INDEPENDENT_AMBULATORY_CARE_PROVIDER_SITE_OTHER): Payer: Medicare Other | Admitting: Nurse Practitioner

## 2019-09-01 ENCOUNTER — Other Ambulatory Visit: Payer: Self-pay

## 2019-09-01 VITALS — BP 118/79 | HR 68 | Temp 96.9°F | Ht 59.0 in | Wt 229.2 lb

## 2019-09-01 DIAGNOSIS — Z01818 Encounter for other preprocedural examination: Secondary | ICD-10-CM | POA: Diagnosis not present

## 2019-09-01 DIAGNOSIS — Z20822 Contact with and (suspected) exposure to covid-19: Secondary | ICD-10-CM | POA: Diagnosis not present

## 2019-09-01 NOTE — Patient Instructions (Signed)
Dental Extraction, Care After This sheet gives you information about how to care for yourself after your procedure. Your dental care provider may also give you more specific instructions. If you have problems or questions, contact your dental care provider. What can I expect after the procedure? After the procedure, it is common to have:  Mild bleeding from the socket where the tooth was removed.  Pain and swelling for a few days.  Numbness for a few hours, if you received numbing medicine (local anesthetic).  Bruising on the jaw or cheeks. Follow these instructions at home: Mouth care  Follow instructions from your dental care provider about how to take care of the surgical (extraction) area. Make sure you: ? Wash your hands with soap and water before you touch your mouth or your gauze pads. If soap and water are not available, use hand sanitizer. ? Change and remove your gauze as told by your dental care provider. ? Leave stitches (sutures) in place. They may need to stay in place for 2 weeks or longer, or they may dissolve on their own over several weeks.  If you have bleeding that does not stop, fold a clean piece of gauze and place it on the bleeding gum. Bite down on it gently but firmly. Do not chew on the gauze.  Do notdo any of the following until your dental care provider says that it is okay: ? Rinse your mouth. ? Brush or floss near your extraction area. You may brush your other teeth. ? Spit.  After your dental care provider says that you may rinse your mouth: ? Gargle with a salt-water mixture 3-4 times a day or as needed. To make a salt-water mixture, completely dissolve -1 tsp of salt in 1 cup of warm water. ? Do not rinse with a lot of force (vigorously). Doing that can break up the blood clot that forms over the extraction site. The blood clot is the beginning of the healing process.  If you wear dental prostheses, such as dentures, bridges, partials, or orthodontic  retainers, talk with your dental care provider about when you may resume wearing them. Eating and drinking   Do not drink through a straw until your dental care provider says it is okay.  Eat cool, soft-textured foods as told by your dental care provider.  Avoid hot drinks and spicy foods until your mouth has healed. Activity  Do not drive or use heavy machinery for 24 hours if you were given a medicine to help you relax (sedative) during your procedure.  Do not drive or use heavy machinery while taking prescription pain medicine.  Return to your normal activities as told by your dental care provider. Ask your dental care provider what activities are safe for you. Managing pain and swelling  If directed, put ice on your cheek on the affected side of your mouth: ? Put ice in a plastic bag. ? Place a towel between your skin and the bag. ? Leave the ice on for 20 minutes, 2-3 times a day. General instructions  Take over-the-counter and prescription medicines only as told by your dental care provider.  If you are taking prescription pain medicine, take actions to prevent or treat constipation. Your health care provider may recommend that you: ? Drink enough fluid to keep your urine pale yellow. ? Limit foods that are high in fat and processed sugars, such as fried and sweet foods. ? Take an over-the-counter or prescription medicine for constipation.  If you  were prescribed an antibiotic medicine, take it as told by your dental care provider. Do not stop taking the antibiotic even if you start to feel better.  Do not use any products that contain nicotine or tobacco, such as cigarettes and e-cigarettes. If you need help quitting, ask your health care provider.  Keep all follow-up visits as told by your dental care provider. This is important. Contact a health care provider if:  You have pain that does not get better after you take your prescribed medicine.  You have any of the  following: ? A fever. ? Nausea. ? Vomiting. ? Chills.  You have any of these in the surgical area: ? A lot of redness on your face. ? A lot of swelling in your mouth or on your face. ? A small amount of clear fluid or pus coming from the socket. ? New bleeding coming from the socket.  Your symptoms get worse.  You develop new symptoms.  You have tingling or numbness in your lip or jaw that does not go away. Get help right away if:  You have bleeding that is severe or does not stop after you bite down on several gauze pads.  You have severe pain that does not get better with medicine.  You have swelling that gets worse instead of better.  You have a lot of clear fluid or pus coming from the surgical area.  You have difficulty swallowing.  You cannot open your mouth.  You have shortness of breath.  You have chest pain. Summary  If you have bleeding that does not stop, fold a clean piece of gauze and place it on the bleeding gum. Bite down on it gently but firmly. Do not chew on the gauze.  Do not rinse your mouth or spit until your dental care provider says that it is okay.  Avoid hot drinks and spicy foods until your mouth has healed. This information is not intended to replace advice given to you by your health care provider. Make sure you discuss any questions you have with your health care provider. Document Revised: 11/30/2016 Document Reviewed: 10/04/2016 Elsevier Patient Education  [place].

## 2019-09-01 NOTE — Progress Notes (Signed)
Established Patient Office Visit  Subjective:  Patient ID: Julie Carter, female    DOB: [birth year]  Age: 34 y.o. MRN: [id number]  CC:  Chief Complaint  Patient presents with  . Surgical Clearance    Dental procedure    HPI EMONNI DEPASQUALE presents for pre-surgical clearance.  Head to toe assessment, EKG, labs and paperwork completion.  Past Medical History:  Diagnosis Date  . Cancer ([place])   . Congenital nystagmus   . Diabetes mellitus   . Down's syndrome   . Heart murmur   . Leukemia [facility])     Past Surgical History:  Procedure Laterality Date  . HERNIA REPAIR     umbilical    Family History  Problem Relation Age of Onset  . Diabetes Mother   . Hypertension Mother   . Hypertension Father     Social History   Socioeconomic History  . Marital status: Single    Spouse name: Not on file  . Number of children: 0  . Years of education: 10  . Highest education level: 10th grade  Occupational History  . Occupation: Disabled  Tobacco Use  . Smoking status: Never Smoker  . Smokeless tobacco: Never Used  Vaping Use  . Vaping Use: Never used  Substance and Sexual Activity  . Alcohol use: No    Alcohol/week: 0.0 standard drinks  . Drug use: No  . Sexual activity: Never  Other Topics Concern  . Not on file  Social History Narrative   Pt has Downs Syndrome and lives at home with her parents who take care of everything for her.   Social Determinants of Health   Financial Resource Strain:   . Difficulty of Paying Living Expenses: Not on file  Food Insecurity:   . Worried About Charity fundraiser in the Last Year: Not on file  . Ran Out of Food in the Last Year: Not on file  Transportation Needs:   . Lack of Transportation (Medical): Not on file  . Lack of Transportation (Non-Medical): Not on file  Physical Activity:   . Days of Exercise per Week: Not on file  . Minutes of Exercise per Session: Not on file  Stress:   . Feeling of Stress : Not on file    Social Connections:   . Frequency of Communication with Friends and Family: Not on file  . Frequency of Social Gatherings with Friends and Family: Not on file  . Attends Religious Services: Not on file  . Active Member of Clubs or Organizations: Not on file  . Attends Archivist Meetings: Not on file  . Marital Status: Not on file  Intimate Partner Violence:   . Fear of Current or Ex-Partner: Not on file  . Emotionally Abused: Not on file  . Physically Abused: Not on file  . Sexually Abused: Not on file    Outpatient Medications Prior to Visit  Medication Sig Dispense Refill  . DENTA 5000 PLUS 1.1 % CREA dental cream Take by mouth as directed.    . Empagliflozin-metFORMIN HCl ER (SYNJARDY XR) 12.05-998 MG TB24 Take 2 tablets by mouth daily. 60 tablet 2  . glucose blood test strip Use as instructed 100 each 12  . levothyroxine (SYNTHROID) 137 MCG tablet Take 1 tablet (137 mcg total) by mouth daily before breakfast. 30 tablet 2  . Vitamin D, Ergocalciferol, (DRISDOL) 1.25 MG (50000 UNIT) CAPS capsule TAKE 1 CAPSULE BY MOUTH EVERY 7 DAYS 12 capsule 3  No facility-administered medications prior to visit.    ROS Review of Systems  Constitutional: Negative.   HENT: Negative.   Eyes: Negative.   Respiratory: Negative.   Cardiovascular: Negative.   Gastrointestinal: Negative.   Genitourinary: Negative.   Musculoskeletal: Negative.   Skin: Negative.       Objective:    Physical Exam Vitals reviewed. Exam conducted with a chaperone present.  Constitutional:      Appearance: Normal appearance.  HENT:     Head: Normocephalic.     Nose: Nose normal.  Eyes:     Conjunctiva/sclera: Conjunctivae normal.  Cardiovascular:     Rate and Rhythm: Normal rate and regular rhythm.     Pulses: Normal pulses.     Heart sounds: Normal heart sounds.  Pulmonary:     Effort: Pulmonary effort is normal.     Breath sounds: Normal breath sounds.  Abdominal:     General: Bowel  sounds are normal.  Musculoskeletal:        General: Normal range of motion.  Skin:    General: Skin is warm.  Neurological:     Mental Status: She is alert and oriented to person, place, and time.  Psychiatric:        Mood and Affect: Mood normal.        Behavior: Behavior normal.     BP 118/79   Pulse 68   Temp (!) 96.9 F (36.1 C) (Temporal)   Ht 4\' 11"  (1.499 m)   Wt 229 lb 3.2 oz (104 kg)   SpO2 99%   BMI 46.29 kg/m  Wt Readings from Last 3 Encounters:  09/01/19 229 lb 3.2 oz (104 kg)  08/10/19 231 lb (104.8 kg)  04/08/19 233 lb 9.6 oz (106 kg)     Health Maintenance Due  Topic Date Due  . Hepatitis C Screening  Never done  . COVID-19 Vaccine (1) Never done  . OPHTHALMOLOGY EXAM  04/11/2016  . PAP SMEAR-Modifier  03/22/2018  . INFLUENZA VACCINE  08/02/2019  . URINE MICROALBUMIN  09/23/2019      Lab Results  Component Value Date   TSH 2.96 08/10/2019   Lab Results  Component Value Date   WBC 6.7 04/08/2019   HGB 13.7 04/08/2019   HCT 41.1 04/08/2019   MCV 95 04/08/2019   PLT 305 04/08/2019   Lab Results  Component Value Date   NA 141 08/10/2019   K 4.1 08/10/2019   CO2 28 08/10/2019   GLUCOSE 101 (H) 08/10/2019   BUN 11 08/10/2019   CREATININE 0.85 08/10/2019   BILITOT 0.4 04/08/2019   ALKPHOS 85 04/08/2019   AST 24 04/08/2019   ALT 43 (H) 04/08/2019   PROT 6.4 04/08/2019   ALBUMIN 3.9 04/08/2019   CALCIUM 10.1 08/10/2019   ANIONGAP 8 01/17/2015   GFR 75.87 08/10/2019   Lab Results  Component Value Date   CHOL 160 09/23/2018   Lab Results  Component Value Date   HDL 48.20 09/23/2018   Lab Results  Component Value Date   LDLCALC 90 09/23/2018   Lab Results  Component Value Date   TRIG 109.0 09/23/2018   Lab Results  Component Value Date   CHOLHDL 3 09/23/2018   Lab Results  Component Value Date   HGBA1C 6.3 (A) 08/10/2019      Assessment & Plan:  Pre-op examination Patient is a 34 year old female who presents to  clinic for preop clearance.  Head to toe assessment completed, EKG completed, labs completed CBC  and CMP.  Paperwork and fax sent to appropriate designation.  Patient is cleared for dental procedure.  Follow-up as needed.  Problem List Items Addressed This Visit      Other   Pre-op examination - Primary   Relevant Orders   EKG 12-Lead (Completed)        Follow-up: Return if symptoms worsen or fail to improve.    Ivy Lynn, NP

## 2019-09-01 NOTE — Assessment & Plan Note (Signed)
Patient is a 34 year old female who presents to clinic for preop clearance.  Head to toe assessment completed, EKG completed, labs completed CBC and CMP.  Paperwork and fax sent to appropriate designation.  Patient is cleared for dental procedure.  Follow-up as needed.

## 2019-09-02 LAB — COMPREHENSIVE METABOLIC PANEL
ALT: 51 IU/L — ABNORMAL HIGH (ref 0–32)
AST: 28 IU/L (ref 0–40)
Albumin/Globulin Ratio: 1.6 (ref 1.2–2.2)
Albumin: 4.1 g/dL (ref 3.8–4.8)
Alkaline Phosphatase: 90 IU/L (ref 48–121)
BUN/Creatinine Ratio: 10 (ref 9–23)
BUN: 8 mg/dL (ref 6–20)
Bilirubin Total: 0.4 mg/dL (ref 0.0–1.2)
CO2: 25 mmol/L (ref 20–29)
Calcium: 9.5 mg/dL (ref 8.7–10.2)
Chloride: 103 mmol/L (ref 96–106)
Creatinine, Ser: 0.8 mg/dL (ref 0.57–1.00)
GFR calc Af Amer: 110 mL/min/{1.73_m2} (ref >59)
GFR calc non Af Amer: 96 mL/min/{1.73_m2} (ref >59)
Globulin, Total: 2.5 g/dL (ref 1.5–4.5)
Glucose: 112 mg/dL — ABNORMAL HIGH (ref 65–99)
Potassium: 4 mmol/L (ref 3.5–5.2)
Sodium: 143 mmol/L (ref 134–144)
Total Protein: 6.6 g/dL (ref 6.0–8.5)

## 2019-09-02 LAB — CBC WITH DIFFERENTIAL/PLATELET
Basophils Absolute: 0.1 10*3/uL (ref 0.0–0.2)
Basos: 1 %
EOS (ABSOLUTE): 0.1 10*3/uL (ref 0.0–0.4)
Eos: 1 %
Hematocrit: 39.8 % (ref 34.0–46.6)
Hemoglobin: 13.4 g/dL (ref 11.1–15.9)
Immature Grans (Abs): 0 10*3/uL (ref 0.0–0.1)
Immature Granulocytes: 1 %
Lymphocytes Absolute: 1.5 10*3/uL (ref 0.7–3.1)
Lymphs: 24 %
MCH: 31.3 pg (ref 26.6–33.0)
MCHC: 33.7 g/dL (ref 31.5–35.7)
MCV: 93 fL (ref 79–97)
Monocytes Absolute: 0.4 10*3/uL (ref 0.1–0.9)
Monocytes: 6 %
Neutrophils Absolute: 4.4 10*3/uL (ref 1.4–7.0)
Neutrophils: 67 %
Platelets: 331 10*3/uL (ref 150–450)
RBC: 4.28 x10E6/uL (ref 3.77–5.28)
RDW: 16 % — ABNORMAL HIGH (ref 11.7–15.4)
WBC: 6.5 10*3/uL (ref 3.4–10.8)

## 2019-09-02 LAB — SPECIMEN STATUS REPORT

## 2019-09-03 ENCOUNTER — Telehealth: Payer: Self-pay | Admitting: Family

## 2019-09-03 NOTE — Telephone Encounter (Signed)
Pts mom/guardian called stating that pt has an appt to see Dr Lovena Le tomorrow regarding dental surgery and says they are still waiting to received surgical clearance forms from [place]. Pt saw Je for surgical clearance on 09/01/19. Please send ASAP.

## 2019-09-04 NOTE — Telephone Encounter (Signed)
LMTCB

## 2019-09-09 DIAGNOSIS — G4733 Obstructive sleep apnea (adult) (pediatric): Secondary | ICD-10-CM | POA: Diagnosis not present

## 2019-09-15 ENCOUNTER — Ambulatory Visit (INDEPENDENT_AMBULATORY_CARE_PROVIDER_SITE_OTHER): Payer: Medicare Other | Admitting: Licensed Clinical Social Worker

## 2019-09-15 DIAGNOSIS — E1169 Type 2 diabetes mellitus with other specified complication: Secondary | ICD-10-CM

## 2019-09-15 DIAGNOSIS — Q909 Down syndrome, unspecified: Secondary | ICD-10-CM

## 2019-09-15 DIAGNOSIS — E559 Vitamin D deficiency, unspecified: Secondary | ICD-10-CM

## 2019-09-15 NOTE — Patient Instructions (Addendum)
Licensed Clinical Education officer, museum Visit Information  Goals we discussed today:   Client and mother will talk with LCSW in next 30 days about ADLs completion for client (pt-stated)          CARE PLAN ENTRY  Current Barriers:   Down's syndrome in patient with chronic diagnoses of Down's syndrome, DM, Vitamin D deficiency, morbid obesity  Needs help with ADLs  Clinical Social Work Clinical Goal(s):   LCSW will talk with client/mother of client in next 30 days about ADLs completion for client  Interventions:  Encouraged client and mother to call RNCM as needed for nursing support for client   Talked with [name] of client about CCM program  Talked with mother of  client about OSA of client (mother said client uses a C-Pap machine regularly)  Talked with [name] about pain issues of client  Talked with [name]  Talked with [name]  Talked with [name] about communication ability of client  Talked with [name]  Talked with [name] (client likes to watch videos, likes to color or do art drawings, likes music and music videos, watches TV)  Talked with [name] about client completion of ADLs  Talked with [name] about client management of Diabetes  Talked with [name] about social support network for client [name] said she has another daughter who is supportive)  Talked with [name] about mobility of client  Talked with [name] about support for client from Dr. Lottie Dawson, pharmacist at [facility]  Talked with [name] about client volunteering at local store  Talked with [name] about socialization for client  Patient Self Care Activities:  Eats meals with set up assistance  Patient Self Care Deficits  Patient has Down's Syndrome Needs help with ADLs Needs transport help Needs medication assistance  Initial goal documentation       Follow Up Plan: LCSW to call client/[name], mother of client, in next 4 weeks to talk about ADLs completion of client  Materials Provided: No  The patient/ [name], mother of patient,verbalized understanding of instructions provided today and declined a print copy of patient instruction materials.   [name].Madinah Quarry MSW, LCSW Licensed Clinical Social Worker [facility] Care Management [phone]

## 2019-09-15 NOTE — Chronic Care Management (AMB) (Addendum)
°  Chronic Care Management    Clinical Social Work Follow Up Note  09/15/2019 Name: Julie Carter MRN: [id number] DOB: Apr 18, 1985  Julie Carter is a 34 y.o. year old female who is a primary care patient of [name], FNP. The CCM team was consulted for assistance with [name]   Review of patient status, including review of consultants reports, other relevant assessments, and collaboration with appropriate care team members and the patient's provider was performed as part of comprehensive patient evaluation and provision of chronic care management services.    SDOH (Social Determinants of Health) assessments performed: No;risk for tobacco use; risk for depression; risk for stress   Outpatient Encounter Medications as of 09/15/2019  Medication Sig   DENTA 5000 PLUS 1.1 % CREA dental cream Take by mouth as directed.   Empagliflozin-metFORMIN HCl ER (SYNJARDY XR) 12.05-998 MG TB24 Take 2 tablets by mouth daily.   glucose blood test strip Use as instructed   levothyroxine (SYNTHROID) 137 MCG tablet Take 1 tablet (137 mcg total) by mouth daily before breakfast.   Vitamin D, Ergocalciferol, (DRISDOL) 1.25 MG (50000 UNIT) CAPS capsule TAKE 1 CAPSULE BY MOUTH EVERY 7 DAYS   No facility-administered encounter medications on file as of 09/15/2019.    Goals       Client and mother will talk with LCSW in next 30 days about ADLs completion for client (pt-stated)      CARE PLAN ENTRY  Current Barriers:  Down's syndrome in patient with chronic diagnoses of Down's syndrome, DM, Vitamin D deficiency, morbid obesity Needs help with ADLs  Clinical Social Work Clinical Goal(s):  LCSW will talk with client/mother of client in next 30 days about ADLs completion for client  Interventions: Encouraged client and mother to call RNCM as needed for nursing support for client  Talked with Julie Carter of client about CCM program Talked with mother of  client about OSA of client  (mother said client uses a C-Pap machine regularly) Talked with Julie Carter about pain issues of client Talked with Julie Carter about client appetite Talked with Julie Carter about client upcoming appointments Talked with Julie Carter about communication ability of client Talked with Julie Carter about decreased energy of client Talked with Julie Carter about relaxation techniques of client (client likes to watch videos, likes to color or do art drawings, likes music and music videos, watches TV) Talked with Julie Carter about client completion of ADLs Talked with Julie Carter about client management of Diabetes Talked with Julie Carter about social support network for client Julie Carter said she has another daughter who is supportive) Talked with Julie Carter about mobility of client Talked with Julie Carter about support for client from Dr. Lottie Dawson, pharmacist at [facility] Talked with Julie Carter about client volunteering at local store Talked with Julie Carter about socialization for client  Patient Self Care Activities:  Eats meals with set up assistance  Patient Self Care Deficits  Patient has Down's Syndrome Needs help with ADLs Needs transport help Needs medication assistance  Initial goal documentation       Follow Up Plan: LCSW to call client/Julie Carter, mother of client, in next 4 weeks to talk about ADLs completion of client  [name].Jessicah Croll MSW, LCSW Licensed Clinical Social Worker [facility] [phone]  I have reviewed the CCM documentation and agree with the written assessment and plan of care.  Evelina Dun, FNP

## 2019-10-02 ENCOUNTER — Other Ambulatory Visit: Payer: Self-pay | Admitting: Family

## 2019-10-02 DIAGNOSIS — E1169 Type 2 diabetes mellitus with other specified complication: Secondary | ICD-10-CM

## 2019-10-08 ENCOUNTER — Ambulatory Visit: Payer: Medicare Other | Admitting: Family

## 2019-10-23 ENCOUNTER — Telehealth: Payer: Medicare Other

## 2019-10-25 ENCOUNTER — Other Ambulatory Visit: Payer: Self-pay | Admitting: Family

## 2019-10-25 DIAGNOSIS — E1169 Type 2 diabetes mellitus with other specified complication: Secondary | ICD-10-CM

## 2019-11-17 ENCOUNTER — Ambulatory Visit: Payer: Medicare Other | Admitting: Licensed Clinical Social Worker

## 2019-11-17 DIAGNOSIS — Z6841 Body Mass Index (BMI) 40.0 and over, adult: Secondary | ICD-10-CM

## 2019-11-17 DIAGNOSIS — Q909 Down syndrome, unspecified: Secondary | ICD-10-CM

## 2019-11-17 DIAGNOSIS — E1169 Type 2 diabetes mellitus with other specified complication: Secondary | ICD-10-CM

## 2019-11-17 DIAGNOSIS — E559 Vitamin D deficiency, unspecified: Secondary | ICD-10-CM

## 2019-11-17 NOTE — Patient Instructions (Addendum)
Licensed Clinical Social Worker Visit Information  Goals we discussed today:   .  Client and mother will talk with LCSW in next 30 days about ADLs completion for client (pt-stated)         CARE PLAN ENTRY  Current Barriers:   Down's syndrome in patient with chronic diagnoses of Down's syndrome, DM, Vitamin D deficiency, morbid obesity  Needs help with ADLs  Clinical Social Work Clinical Goal(s):   LCSW will talk with client/mother of client in next 30 days about ADLs completion for client  Interventions:  Encouraged client and mother to call RNCM as needed for nursing support for client   Talked with [name] of client about CCM program  Talked with mother of  client about OSA of client (mother said client uses a C-Pap machine regularly)  Talked with [name] about pain issues of client  Talked with [name]  Talked with [name]  Talked with [name] about communication ability of client  Talked with [name]  Talked with [name] (client likes to watch videos, likes to color or do art drawings, likes music and music videos, watches TV)  Talked with [name]  Talked with [name] about sleeping issues of client  Talked with [name] about her own back issues, some pain in her hip  Talked with [name] about current in home assistance with aunt of client  Talked with [name] about client home activities (client can fold clothes and take dishes out of dishwasher)  Talked with [name] about food provision for client and for [place].   Patient Self Care Activities:  Eats meals with set up assistance  Patient Self Care Deficits  Patient has Down's Syndrome Needs help with ADLs Needs transport help Needs medication assistance  Initial goal documentation       Follow Up Plan:  LCSW to call  client/[name], mother of client, in next 4 weeks to talk about ADLs completion of client  Materials Provided: No  The patient/[name], mother of client,  verbalized understanding of instructions provided today and declined a print copy of patient instruction materials.   [name].Dionne Knoop MSW, LCSW Licensed Clinical Social Worker [facility] Care Management [phone]

## 2019-11-17 NOTE — Chronic Care Management (AMB) (Addendum)
  Chronic Care Management    Clinical Social Work Follow Up Note  11/17/2019 Name: Julie Carter MRN: [id number] DOB: 1985/09/30  Julie Carter is a 34 y.o. year old female who is a primary care patient of [name], FNP. The CCM team was consulted for assistance with [name]   Review of patient status, including review of consultants reports, other relevant assessments, and collaboration with appropriate care team members and the patient's provider was performed as part of comprehensive patient evaluation and provision of chronic care management services.    SDOH (Social Determinants of Health) assessments performed: No; risk for tobacco use; ris for depression; risk for physical inactivity; risk for stress; risk for social isolation    Outpatient Encounter Medications as of 11/17/2019  Medication Sig   DENTA 5000 PLUS 1.1 % CREA dental cream Take by mouth as directed.   glucose blood test strip Use as instructed   levothyroxine (SYNTHROID) 137 MCG tablet Take 1 tablet (137 mcg total) by mouth daily before breakfast.   SYNJARDY XR 12.05-998 MG TB24 TAKE 2 TABLETS BY MOUTH EVERY DAY   Vitamin D, Ergocalciferol, (DRISDOL) 1.25 MG (50000 UNIT) CAPS capsule TAKE 1 CAPSULE BY MOUTH EVERY 7 DAYS   No facility-administered encounter medications on file as of 11/17/2019.    Goals       Client and mother will talk with LCSW in next 30 days about ADLs completion for client (pt-stated)      CARE PLAN ENTRY  Current Barriers:  Down's syndrome in patient with chronic diagnoses of Down's syndrome, DM, Vitamin D deficiency, morbid obesity Needs help with ADLs  Clinical Social Work Clinical Goal(s):  LCSW will talk with client/mother of client in next 30 days about ADLs completion for client  Interventions: Encouraged client and mother to call RNCM as needed for nursing support for client  Talked with [name] of client about CCM program Talked with mother of   client about OSA of client (mother said client uses a C-Pap machine regularly) Talked with [name] about pain issues of client Talked with [name] Talked with [name] Talked with [name] about communication ability of client Talked with [name] Talked with [name] (client likes to watch videos, likes to color or do art drawings, likes music and music videos, watches TV) Talked with [name] Talked with [name] about sleeping issues of client Talked with [name] about her own back issues, some pain in her hip Talked with [name] about current in home assistance with aunt of client Talked with [name] about client home activities (client can fold clothes and take dishes out of dishwasher) Talked with [name] about food provision for client and for [place].   Patient Self Care Activities:  Eats meals with set up assistance  Patient Self Care Deficits  Patient has Down's Syndrome Needs help with ADLs Needs transport help Needs medication assistance  Initial goal documentation        Follow Up Plan:   LCSW to call client/[name], mother of client, in next 4 weeks to talk about ADLs completion of client  [name].Raylan Troiani MSW, LCSW Licensed Clinical Social Worker [facility] Care [facility] [phone]  I have reviewed and agree with the above  documentation.   Evelina Dun, FNP

## 2019-12-06 ENCOUNTER — Other Ambulatory Visit: Payer: Self-pay | Admitting: Family

## 2019-12-06 DIAGNOSIS — E1169 Type 2 diabetes mellitus with other specified complication: Secondary | ICD-10-CM

## 2019-12-09 ENCOUNTER — Other Ambulatory Visit: Payer: Self-pay | Admitting: Endocrinology

## 2019-12-09 DIAGNOSIS — E890 Postprocedural hypothyroidism: Secondary | ICD-10-CM

## 2019-12-10 ENCOUNTER — Ambulatory Visit: Payer: Medicare Other | Admitting: Licensed Clinical Social Worker

## 2019-12-10 DIAGNOSIS — E1169 Type 2 diabetes mellitus with other specified complication: Secondary | ICD-10-CM

## 2019-12-10 DIAGNOSIS — Q909 Down syndrome, unspecified: Secondary | ICD-10-CM

## 2019-12-10 DIAGNOSIS — E559 Vitamin D deficiency, unspecified: Secondary | ICD-10-CM

## 2019-12-10 NOTE — Chronic Care Management (AMB) (Signed)
  Chronic Care Management    Clinical Social Work Follow Up Note  12/10/2019 Name: Julie Carter MRN: [id number] DOB: [birth year]  Julie Carter is a 34 y.o. year old female who is a primary care patient of [name], FNP. The CCM team was consulted for assistance with [name]   Review of patient status, including review of consultants reports, other relevant assessments, and collaboration with appropriate care team members and the patient's provider was performed as part of comprehensive patient evaluation and provision of chronic care management services.    SDOH (Social Determinants of Health) assessments performed: No;risk for depression; risk for tobacco use; risk for stress; risk for physical inactivity  Outpatient Encounter Medications as of 12/10/2019  Medication Sig  . DENTA 5000 PLUS 1.1 % CREA dental cream Take by mouth as directed.  . Empagliflozin-metFORMIN HCl ER (SYNJARDY XR) 12.05-998 MG TB24 Take 2 tablets by mouth daily. (Needs to be seen before next refill)  . glucose blood test strip Use as instructed  . levothyroxine (SYNTHROID) 137 MCG tablet TAKE 1 TABLET (137 MCG TOTAL) BY MOUTH DAILY BEFORE BREAKFAST.  [name] Vitamin D, Ergocalciferol, (DRISDOL) 1.25 MG (50000 UNIT) CAPS capsule TAKE 1 CAPSULE BY MOUTH EVERY 7 DAYS   No facility-administered encounter medications on file as of 12/10/2019.    Goals    .  Client and mother will talk with LCSW in next 30 days about ADLs completion for client (pt-stated)      CARE PLAN ENTRY  Current Barriers:  . Down's syndrome in patient with chronic diagnoses of Down's syndrome, DM, Vitamin D deficiency, morbid obesity . Needs help with ADLs  Clinical Social Work Clinical Goal(s):  [name] LCSW will talk with client/mother of client in next 30 days about ADLs completion for client  Interventions: . Encouraged client and mother to call RNCM as needed for nursing support for client  . Talked with [name]  of client about CCM program . Talked with mother of  client about OSA of client (mother said client uses a C-Pap machine regularly) . Talked with [name] about pain issues of client . Talked with [name] . Talked with [name] . Talked with [name] about communication ability of client . Talked with [name] . Talked with [name] (client likes to watch videos, likes to color or do art drawings, likes music and music videos, watches TV) . Talked with [name] about sleep issues of client . Talked with [name] about mobility of client   Patient Self Care Activities:  Eats meals with set up assistance  Patient Self Care Deficits  Patient has Down's Syndrome Needs help with ADLs Needs transport help Needs medication assistance  Initial goal documentation     Follow Up Plan:  LCSW to call client/[name], mother of client, in next 4 weeks to talk about ADLs completion of client  [name].[name] MSW, LCSW Licensed Clinical Social Worker [facility] [phone]

## 2019-12-10 NOTE — Patient Instructions (Addendum)
Licensed Clinical Social Worker Visit Information  Goals we discussed today:  .  Client and mother will talk with LCSW in next 30 days about ADLs completion for client (pt-stated)        CARE PLAN ENTRY  Current Barriers:   Down's syndrome in patient with chronic diagnoses of Down's syndrome, DM, Vitamin D deficiency, morbid obesity  Needs help with ADLs  Clinical Social Work Clinical Goal(s):   LCSW will talk with client/mother of client in next 30 days about ADLs completion for client  Interventions:  Encouraged client and mother to call RNCM as needed for nursing support for client   Talked with Julie Carter of client about CCM program  Talked with mother of  client about OSA of client (mother said client uses a C-Pap machine regularly)  Talked with Julie Carter about pain issues of client  Talked with Julie Carter about client appetite  Talked with Julie Carter about client upcoming appointments  Talked with Julie Carter about communication ability of client  Talked with Julie Carter about decreased energy of client  Talked with Julie Carter about relaxation techniques of client (client likes to watch videos, likes to color or do art drawings, likes music and music videos, watches TV)  Talked with Julie Carter about sleep issues of client  Talked with Julie Carter about mobility of client   Patient Self Care Activities:  Eats meals with set up assistance  Patient Self Care Deficits  Patient has Down's Syndrome Needs help with ADLs Needs transport help Needs medication assistance  Initial goal documentation     Follow Up Plan:LCSW to call client/Julie Carter, mother of client, in next 4 weeks to talk about ADLs completion of client  Materials Provided: No  The patient/Julie Carter, mother of client,verbalized understanding of instructions provided today and declined a print copy of patient instruction materials.   [name].[name] MSW, LCSW Licensed Clinical Social  Worker [facility] [phone]

## 2019-12-14 DIAGNOSIS — G4733 Obstructive sleep apnea (adult) (pediatric): Secondary | ICD-10-CM | POA: Diagnosis not present

## 2020-01-06 ENCOUNTER — Other Ambulatory Visit: Payer: Self-pay | Admitting: Family

## 2020-01-06 DIAGNOSIS — E1169 Type 2 diabetes mellitus with other specified complication: Secondary | ICD-10-CM

## 2020-01-06 NOTE — Telephone Encounter (Signed)
Left message for patient to call back to schedule an appointment for medication refill.  

## 2020-01-06 NOTE — Telephone Encounter (Signed)
[facility]. NTBS 30 days given 12/07/19

## 2020-01-08 ENCOUNTER — Telehealth: Payer: Self-pay

## 2020-01-08 DIAGNOSIS — E1169 Type 2 diabetes mellitus with other specified complication: Secondary | ICD-10-CM

## 2020-01-08 MED ORDER — SYNJARDY XR 12.5-1000 MG PO TB24: 2.0000 | ORAL_TABLET | Freq: Every day | ORAL | 0 refills | Status: DC

## 2020-01-08 NOTE — Telephone Encounter (Signed)
  Prescription Request  01/08/2020  What is the name of the medication or equipment? Synjardy HR  Have you contacted your pharmacy to request a refill? (if applicable) yes  Which pharmacy would you like this sent to? [facility]   Patient notified that their request is being sent to the clinical staff for review and that they should receive a response within 2 business days.   Lenna Gilford' pt.  She is completely out & needs today.  She has an appt on Feb 1st w/[facility].

## 2020-01-08 NOTE — Telephone Encounter (Signed)
Aware refill sent to pharmacy ?

## 2020-01-14 ENCOUNTER — Ambulatory Visit: Payer: Medicare Other | Admitting: Licensed Clinical Social Worker

## 2020-01-14 DIAGNOSIS — E1169 Type 2 diabetes mellitus with other specified complication: Secondary | ICD-10-CM

## 2020-01-14 DIAGNOSIS — Q909 Down syndrome, unspecified: Secondary | ICD-10-CM

## 2020-01-14 DIAGNOSIS — E559 Vitamin D deficiency, unspecified: Secondary | ICD-10-CM

## 2020-01-14 NOTE — Chronic Care Management (AMB) (Signed)
  Chronic Care Management    Clinical Social Work Follow Up Note  01/14/2020 Name: Julie Carter MRN: [id number] DOB: [birth year]  Julie Carter is a 35 y.o. year old female who is a primary care patient of [name], FNP. The CCM team was consulted for assistance with [name]   Review of patient status, including review of consultants reports, other relevant assessments, and collaboration with appropriate care team members and the patient's provider was performed as part of comprehensive patient evaluation and provision of chronic care management services.    SDOH (Social Determinants of Health) assessments performed: No; risk for depression; risk for tobacco use; risk for stress; risk for physical inactivity   Outpatient Encounter Medications as of 01/14/2020  Medication Sig  . DENTA 5000 PLUS 1.1 % CREA dental cream Take by mouth as directed.  . Empagliflozin-metFORMIN HCl ER (SYNJARDY XR) 12.05-998 MG TB24 Take 2 tablets by mouth daily.  [name] glucose blood test strip Use as instructed  . levothyroxine (SYNTHROID) 137 MCG tablet TAKE 1 TABLET (137 MCG TOTAL) BY MOUTH DAILY BEFORE BREAKFAST.  [name] Vitamin D, Ergocalciferol, (DRISDOL) 1.25 MG (50000 UNIT) CAPS capsule TAKE 1 CAPSULE BY MOUTH EVERY 7 DAYS   No facility-administered encounter medications on file as of 01/14/2020.    Goals    .  Client and mother will talk with LCSW in next 30 days about ADLs completion for client (pt-stated)      CARE PLAN ENTRY  Current Barriers:  . Down's syndrome in patient with chronic diagnoses of Down's syndrome, DM, Vitamin D deficiency, morbid obesity . Needs help with ADLs  Clinical Social Work Clinical Goal(s):  [name] LCSW will talk with client/mother of client in next 30 days about ADLs completion for client  Interventions: . Encouraged client and mother to call RNCM as needed for nursing support for client  . Talked with [name] of client about CCM  program . Talked with mother of  client about OSA of client (mother said client uses a C-Pap machine regularly) . Talked with [name] about pain issues of client . Talked with [name] . Talked with [name] . Talked with [name] . Talked with [name] (client likes to watch videos, likes to color or do art drawings, likes music and music videos, watches TV) . Talked with [name] . Talked with [name] . Talked wit [name]  Patient Self Care Activities:  Eats meals with set up assistance  Patient Self Care Deficits  Patient has Down's Syndrome Needs help with ADLs Needs transport help Needs medication assistance  Initial goal documentation     Follow Up Plan:LCSW to call client/[name], mother of client, in next 4 weeks to talk about ADLs completion of client  [name].[name] MSW, LCSW Licensed Clinical Social Worker [facility] Care Management [phone]

## 2020-01-14 NOTE — Patient Instructions (Addendum)
Licensed Clinical Social Worker Visit Information  Goals we discussed today:    .  Client and mother will talk with LCSW in next 30 days about ADLs completion for client (pt-stated)        CARE PLAN ENTRY  Current Barriers:   Down's syndrome in patient with chronic diagnoses of Down's syndrome, DM, Vitamin D deficiency, morbid obesity  Needs help with ADLs  Clinical Social Work Clinical Goal(s):   LCSW will talk with client/mother of client in next 30 days about ADLs completion for client  Interventions:  Encouraged client and mother to call RNCM as needed for nursing support for client   Talked with [name] of client about CCM program  Talked with mother of  client about OSA of client (mother said client uses a C-Pap machine regularly)  Talked with [name] about pain issues of client  Talked with [name]  Talked with [name]  Talked with [name]  Talked with [name] (client likes to watch videos, likes to color or do art drawings, likes music and music videos, watches TV)  Talked with [name]  Talked with [name]  Talked wit [name]  Patient Self Care Activities:  Eats meals with set up assistance  Patient Self Care Deficits  Patient has Down's Syndrome Needs help with ADLs Needs transport help Needs medication assistance  Initial goal documentation     Follow Up Plan:LCSW to call client/[name], mother of client, in next 4 weeks to talk about ADLs completion of client  Materials Provided: No  The patient/[name], mother of patient, verbalized understanding of instructions provided today and declined a print copy of patient instruction materials.   [name].Apolonia Ellwood MSW, LCSW Licensed Clinical Social Worker [facility] Care Management [phone]

## 2020-01-30 DIAGNOSIS — Z9221 Personal history of antineoplastic chemotherapy: Secondary | ICD-10-CM | POA: Diagnosis not present

## 2020-01-30 DIAGNOSIS — U071 COVID-19: Secondary | ICD-10-CM | POA: Insufficient documentation

## 2020-01-30 DIAGNOSIS — N3289 Other specified disorders of bladder: Secondary | ICD-10-CM | POA: Diagnosis not present

## 2020-01-30 DIAGNOSIS — D6869 Other thrombophilia: Secondary | ICD-10-CM | POA: Diagnosis not present

## 2020-01-30 DIAGNOSIS — J1282 Pneumonia due to coronavirus disease 2019: Secondary | ICD-10-CM | POA: Diagnosis not present

## 2020-01-30 DIAGNOSIS — R918 Other nonspecific abnormal finding of lung field: Secondary | ICD-10-CM | POA: Diagnosis not present

## 2020-01-30 DIAGNOSIS — Z79899 Other long term (current) drug therapy: Secondary | ICD-10-CM | POA: Diagnosis not present

## 2020-01-30 DIAGNOSIS — E860 Dehydration: Secondary | ICD-10-CM | POA: Diagnosis not present

## 2020-01-30 DIAGNOSIS — Z856 Personal history of leukemia: Secondary | ICD-10-CM | POA: Diagnosis not present

## 2020-01-30 DIAGNOSIS — K449 Diaphragmatic hernia without obstruction or gangrene: Secondary | ICD-10-CM | POA: Diagnosis not present

## 2020-01-30 DIAGNOSIS — E039 Hypothyroidism, unspecified: Secondary | ICD-10-CM | POA: Diagnosis not present

## 2020-01-30 DIAGNOSIS — R059 Cough, unspecified: Secondary | ICD-10-CM | POA: Diagnosis not present

## 2020-01-30 DIAGNOSIS — E038 Other specified hypothyroidism: Secondary | ICD-10-CM | POA: Diagnosis not present

## 2020-01-30 DIAGNOSIS — K760 Fatty (change of) liver, not elsewhere classified: Secondary | ICD-10-CM | POA: Diagnosis not present

## 2020-01-30 DIAGNOSIS — E870 Hyperosmolality and hypernatremia: Secondary | ICD-10-CM | POA: Insufficient documentation

## 2020-01-30 DIAGNOSIS — E119 Type 2 diabetes mellitus without complications: Secondary | ICD-10-CM | POA: Insufficient documentation

## 2020-01-30 DIAGNOSIS — J9601 Acute respiratory failure with hypoxia: Secondary | ICD-10-CM | POA: Diagnosis not present

## 2020-01-30 DIAGNOSIS — R590 Localized enlarged lymph nodes: Secondary | ICD-10-CM | POA: Diagnosis not present

## 2020-01-30 DIAGNOSIS — R000 Tachycardia, unspecified: Secondary | ICD-10-CM | POA: Diagnosis not present

## 2020-01-30 DIAGNOSIS — Z7984 Long term (current) use of oral hypoglycemic drugs: Secondary | ICD-10-CM | POA: Diagnosis not present

## 2020-01-30 DIAGNOSIS — R109 Unspecified abdominal pain: Secondary | ICD-10-CM | POA: Diagnosis not present

## 2020-01-30 DIAGNOSIS — I351 Nonrheumatic aortic (valve) insufficiency: Secondary | ICD-10-CM | POA: Diagnosis not present

## 2020-01-30 DIAGNOSIS — I10 Essential (primary) hypertension: Secondary | ICD-10-CM | POA: Diagnosis not present

## 2020-01-31 DIAGNOSIS — E038 Other specified hypothyroidism: Secondary | ICD-10-CM | POA: Diagnosis not present

## 2020-01-31 DIAGNOSIS — E870 Hyperosmolality and hypernatremia: Secondary | ICD-10-CM | POA: Diagnosis not present

## 2020-01-31 DIAGNOSIS — J9601 Acute respiratory failure with hypoxia: Secondary | ICD-10-CM | POA: Diagnosis not present

## 2020-01-31 DIAGNOSIS — I351 Nonrheumatic aortic (valve) insufficiency: Secondary | ICD-10-CM | POA: Diagnosis not present

## 2020-02-01 ENCOUNTER — Ambulatory Visit: Payer: Medicare Other | Admitting: Endocrinology

## 2020-02-01 DIAGNOSIS — E870 Hyperosmolality and hypernatremia: Secondary | ICD-10-CM | POA: Diagnosis not present

## 2020-02-01 DIAGNOSIS — E038 Other specified hypothyroidism: Secondary | ICD-10-CM | POA: Diagnosis not present

## 2020-02-01 DIAGNOSIS — J9601 Acute respiratory failure with hypoxia: Secondary | ICD-10-CM | POA: Diagnosis not present

## 2020-02-02 ENCOUNTER — Ambulatory Visit: Payer: Medicare Other | Admitting: Family

## 2020-02-02 DIAGNOSIS — E038 Other specified hypothyroidism: Secondary | ICD-10-CM | POA: Diagnosis not present

## 2020-02-02 DIAGNOSIS — E870 Hyperosmolality and hypernatremia: Secondary | ICD-10-CM | POA: Diagnosis not present

## 2020-02-02 DIAGNOSIS — J9601 Acute respiratory failure with hypoxia: Secondary | ICD-10-CM | POA: Diagnosis not present

## 2020-02-03 DIAGNOSIS — E038 Other specified hypothyroidism: Secondary | ICD-10-CM | POA: Diagnosis not present

## 2020-02-03 DIAGNOSIS — J9601 Acute respiratory failure with hypoxia: Secondary | ICD-10-CM | POA: Diagnosis not present

## 2020-02-03 DIAGNOSIS — E870 Hyperosmolality and hypernatremia: Secondary | ICD-10-CM | POA: Diagnosis not present

## 2020-02-04 ENCOUNTER — Other Ambulatory Visit: Payer: Self-pay | Admitting: Family

## 2020-02-04 DIAGNOSIS — E1169 Type 2 diabetes mellitus with other specified complication: Secondary | ICD-10-CM

## 2020-02-04 DIAGNOSIS — E870 Hyperosmolality and hypernatremia: Secondary | ICD-10-CM | POA: Diagnosis not present

## 2020-02-04 DIAGNOSIS — E038 Other specified hypothyroidism: Secondary | ICD-10-CM | POA: Diagnosis not present

## 2020-02-04 DIAGNOSIS — J9601 Acute respiratory failure with hypoxia: Secondary | ICD-10-CM | POA: Diagnosis not present

## 2020-02-05 DIAGNOSIS — E870 Hyperosmolality and hypernatremia: Secondary | ICD-10-CM | POA: Diagnosis not present

## 2020-02-05 DIAGNOSIS — E038 Other specified hypothyroidism: Secondary | ICD-10-CM | POA: Diagnosis not present

## 2020-02-05 DIAGNOSIS — J9601 Acute respiratory failure with hypoxia: Secondary | ICD-10-CM | POA: Diagnosis not present

## 2020-02-06 DIAGNOSIS — E870 Hyperosmolality and hypernatremia: Secondary | ICD-10-CM | POA: Diagnosis not present

## 2020-02-06 DIAGNOSIS — J9601 Acute respiratory failure with hypoxia: Secondary | ICD-10-CM | POA: Diagnosis not present

## 2020-02-06 DIAGNOSIS — E038 Other specified hypothyroidism: Secondary | ICD-10-CM | POA: Diagnosis not present

## 2020-02-08 ENCOUNTER — Telehealth: Payer: Self-pay

## 2020-02-08 ENCOUNTER — Encounter: Payer: Self-pay | Admitting: *Deleted

## 2020-02-08 ENCOUNTER — Telehealth: Payer: Self-pay | Admitting: *Deleted

## 2020-02-08 ENCOUNTER — Ambulatory Visit (INDEPENDENT_AMBULATORY_CARE_PROVIDER_SITE_OTHER): Payer: Medicare Other | Admitting: *Deleted

## 2020-02-08 DIAGNOSIS — Q909 Down syndrome, unspecified: Secondary | ICD-10-CM

## 2020-02-08 DIAGNOSIS — E1169 Type 2 diabetes mellitus with other specified complication: Secondary | ICD-10-CM | POA: Diagnosis not present

## 2020-02-08 MED ORDER — LEVOTHYROXINE SODIUM 125 MCG PO TABS: 125.0000 ug | ORAL_TABLET | Freq: Every day | ORAL | 3 refills | Status: DC

## 2020-02-08 NOTE — Telephone Encounter (Signed)
  Contact Date: 02/08/2020 Contacted By: Felicity Coyer, LPN  Transition Care Management Follow-up Telephone Call  Date of discharge and from where: 02/06/2020, [facility]  Discharge Diagnosis:  Pneumonia due to Covid, Respiratory Failure  How have you been since you were released from the hospital? Mom reports she is doing better, eating well  Any questions or concerns? No   Items Reviewed:  Did the pt receive and understand the discharge instructions provided? Yes   Medications obtained and verified? Yes   Any new allergies since your discharge? No   Dietary orders reviewed? Yes  Do you have support at home? Yes   Discontinued Medications Levothyroxine, need to recheck levels  New Medications Added Decadron 6 mg daily x 2 days Multivitamin Zinc Xarelto 10 mg daily x 30 days  Current Medication List Allergies as of 02/08/2020   No Known Allergies     Medication List       Accurate as of February 08, 2020  4:35 PM. If you have any questions, ask your nurse or doctor.        Denta 5000 Plus 1.1 % Crea dental cream Generic drug: sodium fluoride Take by mouth as directed.   glucose blood test strip Use as instructed   levothyroxine 125 MCG tablet Commonly known as: SYNTHROID Take 1 tablet (125 mcg total) by mouth daily. What changed:   medication strength  how much to take  when to take this Changed by: Ilean China, RN   Synjardy XR 12.05-998 MG Tb24 Generic drug: Empagliflozin-metFORMIN HCl ER Take 2 tablets by mouth daily. NEEDS TO BE SEEN FOR FURTHER REFILLS   Tropical Liquid Nutrition Liqd Take 5 mLs by mouth daily.   Vitamin D (Ergocalciferol) 1.25 MG (50000 UNIT) Caps capsule Commonly known as: DRISDOL TAKE 1 CAPSULE BY MOUTH EVERY 7 DAYS   Xarelto 10 MG Tabs tablet Generic drug: rivaroxaban Take 10 mg by mouth daily.   zinc sulfate 220 (50 Zn) MG capsule Take 1 capsule by mouth daily.        Home Care and  Equipment/Supplies: Were home health services ordered? no If so, what is the name of the agency? Not applicable  Has the agency set up a time to come to the patient's home? not applicable Were any new equipment or medical supplies ordered?  No What is the name of the medical supply agency? Not applicable Were you able to get the supplies/equipment? not applicable Do you have any questions related to the use of the equipment or supplies? No  Functional Questionnaire: (I = Independent and D = Dependent) ADLs: Dependent  Bathing/Dressing- Dependent  Meal Prep-  Dependent  Eating- Independent  Maintaining continence-  Independent  Transferring/Ambulation-  Independent  Managing Meds-  Dependent  Follow up appointments reviewed:   PCP Hospital f/u appt confirmed? Yes  Scheduled to see Evelina Dun on 02/19/2020 at 11:40 am..  [facility] f/u appt confirmed? not applicable  Are transportation arrangements needed? No   If their condition worsens, is the pt aware to call PCP or go to the Emergency Dept.? Yes  Was the patient provided with contact information for the PCP's office or ED? Yes  Was to pt encouraged to call back with questions or concerns? Yes

## 2020-02-08 NOTE — Patient Instructions (Signed)
Visit Information  PATIENT GOALS: Goals Addressed            This Visit's Progress   . Medication Management: Hypothyroidism   Not on track    Timeframe:  Short-Term Goal Priority:  High Start Date: 02/08/20                            Expected End Date:  04/07/20                     Follow-up: 04/07/20  . Work with PCP to adjust thyroid medication . Have a repeat TSH in 6 to 8 weeks . Call PCP [phone] with any questions or concerns regarding thyroid management . Call CCM team as needed    . Monitor and Manage My Blood Sugar-Diabetes Type 2       Timeframe:  Long-Range Goal Priority:  Medium Start Date:  02/08/20                           Expected End Date:  08/07/20                    Follow Up Date 04/07/20    . check blood sugar at prescribed times . check blood sugar if I feel it is too high or too low . enter blood sugar readings and medication or insulin into daily log . take the blood sugar log to all doctor visits  . Call PCP with any readings outside of recommended range . Make healthy food choices and limit carbohydrates . Take medication as prescribed . Understand that oral steroids will raise blood sugar   Why is this important?    Checking your blood sugar at home helps to keep it from getting very high or very low.   Writing the results in a diary or log helps the doctor know how to care for you.   Your blood sugar log should have the time, date and the results.   Also, write down the amount of insulin or other medicine that you take.   Other information, like what you ate, exercise done and how you were feeling, will also be helpful.     Notes:        Patient verbalizes understanding of instructions provided today and agrees to view in [place].   Follow Up Plan:  Telephone follow up appointment with care management team member scheduled for: 04/07/20 with RN Care Manager The patient has been provided with contact information for the care management  team and has been advised to call with any health related questions or concerns.  Follow up with provider re: medication adjustment  Chong Sicilian, BSN, RN-BC [place] [facility] [place] Management Direct Dial: [phone]

## 2020-02-08 NOTE — Chronic Care Management (AMB) (Signed)
Chronic Care Management   CCM RN Visit Note  02/08/2020 Name: Julie Carter MRN: [id number] DOB: 10-Mar-1985  Subjective: Julie Carter is a 35 y.o. year old female who is a primary care patient of Julie Balloon, FNP. The care management team was consulted for assistance with disease management and care coordination needs.    Engaged with patient's mother, Julie Carter, by telephone for follow up visit in response to provider referral for case management and/or care coordination services. Julie Carter was recently discharged from [facility] on [facility]/06/20 where she was hospitalized for Covid Pneumonia. Per mother she is feeling better and her cough and congestion have improved.   Consent to Services:  The patient was given information about Chronic Care Management services, agreed to services, and gave verbal consent prior to initiation of services.  Please see initial visit note for detailed documentation.   Patient agreed to services and verbal consent obtained.   Assessment: Review of patient past medical history, allergies, medications, health status, including review of consultants reports, laboratory and other test data, was performed as part of comprehensive evaluation and provision of chronic care management services.   SDOH (Social Determinants of Health) assessments and interventions performed:  yes  CCM Care Plan  No Known Allergies  Outpatient Encounter Medications as of 02/08/2020  Medication Sig  . Empagliflozin-metFORMIN HCl ER (SYNJARDY XR) 12.05-998 MG TB24 Take 2 tablets by mouth daily. NEEDS TO BE SEEN FOR FURTHER REFILLS  . Multiple Vitamins-Minerals (TROPICAL LIQUID NUTRITION) LIQD Take 5 mLs by mouth daily.  . Vitamin D, Ergocalciferol, (DRISDOL) 1.25 MG (50000 UNIT) CAPS capsule TAKE 1 CAPSULE BY MOUTH EVERY 7 DAYS  . XARELTO 10 MG TABS tablet Take 10 mg by mouth daily.  [name] zinc sulfate 220 (50 Zn) MG capsule Take 1 capsule by mouth daily.  . DENTA 5000 PLUS 1.1 % CREA  dental cream Take by mouth as directed.  [name] glucose blood test strip Use as instructed  . levothyroxine (SYNTHROID) 137 MCG tablet TAKE 1 TABLET (137 MCG TOTAL) BY MOUTH DAILY BEFORE BREAKFAST. (Patient not taking: Reported on 02/08/2020)   No facility-administered encounter medications on file as of 02/08/2020.    Patient Active Problem List   Diagnosis Date Noted  . Pre-op examination 09/01/2019  . Morbid obesity with BMI of 50.0-59.9, adult ([place]) 03/22/2015  . Hypochloremia   . Vitamin D deficiency 02/03/2014  . Down syndrome 02/02/2014  . Hypothyroidism, postradioiodine therapy 10/03/2012  . Diabetes mellitus ([place]) 10/03/2012    Conditions to be addressed/monitored: Hypothyroidism, DM, Down's Syndrome  Care Plan : RNCM: Hypothyroidism  Updates made by Julie China, RN since 02/08/2020 12:00 AM    Problem: Hypothyroidism   Priority: Medium    Goal: Medication Management   Start Date: 02/08/2020  This Visit's Progress: Not on track  Priority: High  Note:   Current Barriers:  . Chronic Disease Management support and education needs related to hypothyroidism . Cognitive Deficits r/t Down's Syndrome . Unable to perform ADLs and IADLs independently   Nurse Case Manager Clinical Goal(s):  [name] Over the next 5 days, patient will work with PCP to address needs related to mediation adjustment . Over the next 60 days, patient will meet with RN Care Manager to address self-management of hypothyroidism  Interventions:  . 1:1 collaboration with Julie Balloon, FNP regarding development and update of comprehensive plan of care as evidenced by provider attestation and co-signature . Inter-disciplinary care team collaboration (see longitudinal plan of care) .  Evaluation of current treatment plan related to hypothyroidism and patient's adherence to plan as established by provider. . Chart reviewed including discharge notes from [facility] and recent lab results . Talked with Mother,  Julie Carter, about TSH of 0.09 . Reviewed and discussed medications o Advised at discharge to hold levothyroxine 178mcg and to f/u with PCP regarding titration of dose due to low TSH . Telephone message sent to PCP requesting that she review notes and labs and [facility] a [facility] [place] . Advised mother that patient will need a repeat TSH in 6 to 8 and may need an appointment with PCP at that time as well . Encouraged mother to reach out to PCP or CCM team as needed  Patient Goals/Self-Care Activities Over the next 60 days, patient will: . Work with PCP to adjust thyroid medication . Have a repeat TSH in 6 to 8 weeks . Call PCP [phone] with any questions or concerns regarding thyroid management . Call CCM team as needed    Care Plan : RNCM: Diabetes Type 2 (Adult)  Updates made by Julie China, RN since 02/08/2020 12:00 AM    Problem: Glycemic Management (Diabetes, Type 2)   Priority: Medium    Long-Range Goal: Glycemic Management Optimized   This Visit's Progress: On track  Priority: Medium  Note:   Current Barriers:  . Chronic Disease Management support and education needs related to diabetes . Unable to perform ADLs independently . Unable to perform IADLs independently  Nurse Case Manager Clinical Goal(s):  [name] Over the next 60 days, patient will work with Consulting civil engineer to address needs related to self-management of diabetes . Over the next 90 days, the patient will demonstrate ongoing self health care management ability as evidenced by an A1C of <7*  Interventions:  . 1:1 collaboration with Julie Balloon, FNP regarding development and update of comprehensive plan of care as evidenced by provider attestation and co-signature . Inter-disciplinary care team collaboration (see longitudinal plan of care) . Evaluation of current treatment plan related to diabetes and patient's adherence to plan as established by provider. . Chart reviewed  including relevant office notes, lab results, and discharge summary . Reviewed and discussed medications:  o Synjardy XR o Finished Decadron today - Reminded that steroids will cause blood sugar elevation . Discussed A1C of 6.8 while inpatient . Discussed diet o Eating well since discharge o Reinforced low carb diet . Recommended f/u visit with PCP  . Encouraged mother/patient to reach out to [facility] team as needed  Patient Goals/Self-Care Activities Over the next 60 days, patient will: . check blood sugar at prescribed times . check blood sugar if I feel it is too high or too low . enter blood sugar readings and medication or insulin into daily log . take the blood sugar log to all doctor visits  . Call PCP with any readings outside of recommended range . Make healthy food choices and limit carbohydrates . Take medication as prescribed . Understand that oral steroids will raise blood sugar     Follow Up Plan:  Telephone follow up appointment with care management team member scheduled for: 04/07/20 with RN Care Manager The patient has been provided with contact information for the care management team and has been advised to call with any health related questions or concerns.  Follow up with provider re: medication adjustment  Chong Sicilian, BSN, RN-BC [place] /  [place] Management Direct Dial: [phone]

## 2020-02-08 NOTE — Telephone Encounter (Signed)
Levothyroxine decreased to 125 mcg from 137 mcg, Prescription sent to pharmacy.

## 2020-02-08 NOTE — Telephone Encounter (Signed)
Lmtcb.

## 2020-02-08 NOTE — Telephone Encounter (Signed)
02/08/2020  Patient was discharged from [facility] on 02/06/20. Dx with Covid pneumonia. TSH was found to be too low.   Copied from discharge summary: Hypothyroidism:TSH repeated here and low at 0.09. Will hold synthroid for now and have pt f/u with PCP for further titration.   Talked with mother, [name], and advised that I will send to PCP for review for medication adjustment and that she will need to f/u in 6 to 8 weeks for repeat TSH level. May need a visit with PCP at that time as well. [facility] [place]   Forwarding to PCP for review and action.   Chong Sicilian, BSN, RN-BC Embedded Chronic Care Manager [facility] / [place] Management Direct Dial: [phone]

## 2020-02-10 ENCOUNTER — Ambulatory Visit: Payer: Medicare Other | Admitting: Endocrinology

## 2020-02-17 ENCOUNTER — Telehealth: Payer: Medicare Other

## 2020-02-19 ENCOUNTER — Ambulatory Visit (INDEPENDENT_AMBULATORY_CARE_PROVIDER_SITE_OTHER): Payer: Medicare Other | Admitting: Family

## 2020-02-19 ENCOUNTER — Encounter: Payer: Self-pay | Admitting: Family

## 2020-02-19 DIAGNOSIS — E890 Postprocedural hypothyroidism: Secondary | ICD-10-CM | POA: Diagnosis not present

## 2020-02-19 DIAGNOSIS — J1282 Pneumonia due to coronavirus disease 2019: Secondary | ICD-10-CM | POA: Diagnosis not present

## 2020-02-19 DIAGNOSIS — Z09 Encounter for follow-up examination after completed treatment for conditions other than malignant neoplasm: Secondary | ICD-10-CM

## 2020-02-19 DIAGNOSIS — U071 COVID-19: Secondary | ICD-10-CM

## 2020-02-19 DIAGNOSIS — Q909 Down syndrome, unspecified: Secondary | ICD-10-CM

## 2020-02-19 DIAGNOSIS — E1169 Type 2 diabetes mellitus with other specified complication: Secondary | ICD-10-CM | POA: Diagnosis not present

## 2020-02-19 DIAGNOSIS — Z8616 Personal history of COVID-19: Secondary | ICD-10-CM

## 2020-02-19 DIAGNOSIS — Z6841 Body Mass Index (BMI) 40.0 and over, adult: Secondary | ICD-10-CM

## 2020-02-19 NOTE — Progress Notes (Signed)
Virtual Visit via telephone Note Due to COVID-19 pandemic this visit was conducted virtually. This visit type was conducted due to national recommendations for restrictions regarding the COVID-19 Pandemic (e.g. social distancing, sheltering in place) in an effort to limit this patient's exposure and mitigate transmission in our community. All issues noted in this document were discussed and addressed.  A physical exam was not performed with this format.  I connected with Julie Carter's mom  on 02/19/20 at 12:52 pm  by telephone and verified that I am speaking with the correct person using two identifiers. Julie Carter is currently located at home and mother and patient is currently with her during visit. The provider, Evelina Dun, FNP is located in their office at time of visit.  I discussed the limitations, risks, security and privacy concerns of performing an evaluation and management service by telephone and the availability of in person appointments. I also discussed with the patient that there may be a patient responsible charge related to this service. The patient expressed understanding and agreed to proceed.   History and Present Illness:  Mother calls the office today for hospital follow up. She was admitted to the hospital on 01/30/20 with COVID pneumonia and respiratory failure. She was discharged on 02/06/20. Mother reports she is doing much better. Her O2 is staying 97-99%.   She was discharged on dexamethasone, xarelto, and zinc. This is completed the steroid.    Her levothyroxine was also decreased to 125  Mcg from 150 mcg. Mother states she just started this medication.  Shortness of Breath The current episode started 1 to 4 weeks ago. The problem has been rapidly improving. Pertinent negatives include no chest pain, claudication, ear pain, fever, headaches, leg pain, neck pain, sore throat, sputum production or wheezing.  Diabetes She presents for her follow-up diabetic  visit. She has type 2 diabetes mellitus. There are no hypoglycemic associated symptoms. Pertinent negatives for hypoglycemia include no headaches. Pertinent negatives for diabetes include no chest pain and no foot paresthesias. Symptoms are stable. Pertinent negatives for diabetic complications include no CVA or heart disease. Risk factors for coronary artery disease include dyslipidemia, diabetes mellitus, hypertension, sedentary lifestyle and post-menopausal. She is following a generally unhealthy diet. Her overall blood glucose range is 110-130 mg/dl. Eye exam is not current.  Thyroid Problem Presents for follow-up visit. Patient reports no depressed mood, diaphoresis, diarrhea or leg swelling. The symptoms have been stable.      Review of Systems  Constitutional: Negative for diaphoresis and fever.  HENT: Negative for ear pain and sore throat.   Respiratory: Positive for shortness of breath. Negative for sputum production and wheezing.   Cardiovascular: Negative for chest pain and claudication.  Gastrointestinal: Negative for diarrhea.  Musculoskeletal: Negative for neck pain.  Neurological: Negative for headaches.     Observations/Objective: No SOB or distress noted   Assessment and Plan: Julie Carter comes in today with chief complaint of No chief complaint on file.   Diagnosis and orders addressed:  1. Hospital discharge follow-up - CMP14+EGFR; Future - CBC with Differential/Platelet; Future - DG Chest 2 View; Future  2. Pneumonia due to COVID-19 virus - CMP14+EGFR; Future - CBC with Differential/Platelet; Future - DG Chest 2 View; Future  3. Down syndrome - CMP14+EGFR; Future - CBC with Differential/Platelet; Future  4. Type 2 diabetes mellitus with other specified complication, without long-term current use of insulin (HCC) - CMP14+EGFR; Future - CBC with Differential/Platelet; Future - Bayer DCA Hb  A1c Waived; Future  5. Hypothyroidism, postradioiodine  therapy - CMP14+EGFR; Future - CBC with Differential/Platelet; Future  6. Morbid obesity with BMI of 50.0-59.9, adult (HCC) - CMP14+EGFR; Future - CBC with Differential/Platelet; Future   Labs pending Health Maintenance reviewed Diet and exercise encouraged  Follow up plan: 3 months        I discussed the assessment and treatment plan with the patient. The patient was provided an opportunity to ask questions and all were answered. The patient agreed with the plan and demonstrated an understanding of the instructions.   The patient was advised to call back or seek an in-person evaluation if the symptoms worsen or if the condition fails to improve as anticipated.  The above assessment and management plan was discussed with the patient. The patient verbalized understanding of and has agreed to the management plan. Patient is aware to call the clinic if symptoms persist or worsen. Patient is aware when to return to the clinic for a follow-up visit. Patient educated on when it is appropriate to go to the emergency department.   Time call ended:  1:14 pm   I provided 22 minutes of non-face-to-face time during this encounter.    Evelina Dun, FNP

## 2020-02-29 ENCOUNTER — Other Ambulatory Visit: Payer: Self-pay | Admitting: Family

## 2020-02-29 DIAGNOSIS — E1169 Type 2 diabetes mellitus with other specified complication: Secondary | ICD-10-CM

## 2020-03-02 ENCOUNTER — Other Ambulatory Visit: Payer: Self-pay | Admitting: Endocrinology

## 2020-03-02 DIAGNOSIS — E890 Postprocedural hypothyroidism: Secondary | ICD-10-CM

## 2020-03-08 ENCOUNTER — Ambulatory Visit (INDEPENDENT_AMBULATORY_CARE_PROVIDER_SITE_OTHER): Payer: Medicare Other

## 2020-03-08 ENCOUNTER — Other Ambulatory Visit: Payer: Medicare Other

## 2020-03-08 ENCOUNTER — Other Ambulatory Visit: Payer: Self-pay

## 2020-03-08 DIAGNOSIS — J1282 Pneumonia due to coronavirus disease 2019: Secondary | ICD-10-CM

## 2020-03-08 DIAGNOSIS — Z8616 Personal history of COVID-19: Secondary | ICD-10-CM

## 2020-03-08 DIAGNOSIS — U071 COVID-19: Secondary | ICD-10-CM | POA: Diagnosis not present

## 2020-03-08 DIAGNOSIS — Z6841 Body Mass Index (BMI) 40.0 and over, adult: Secondary | ICD-10-CM

## 2020-03-08 DIAGNOSIS — E890 Postprocedural hypothyroidism: Secondary | ICD-10-CM

## 2020-03-08 DIAGNOSIS — Z09 Encounter for follow-up examination after completed treatment for conditions other than malignant neoplasm: Secondary | ICD-10-CM | POA: Diagnosis not present

## 2020-03-08 DIAGNOSIS — E1169 Type 2 diabetes mellitus with other specified complication: Secondary | ICD-10-CM | POA: Diagnosis not present

## 2020-03-08 DIAGNOSIS — Q909 Down syndrome, unspecified: Secondary | ICD-10-CM

## 2020-03-08 LAB — CBC WITH DIFFERENTIAL/PLATELET
Basophils Absolute: 0.1 10*3/uL (ref 0.0–0.2)
Basos: 1 %
EOS (ABSOLUTE): 0.1 10*3/uL (ref 0.0–0.4)
Eos: 1 %
Hematocrit: 42.2 % (ref 34.0–46.6)
Hemoglobin: 13.8 g/dL (ref 11.1–15.9)
Immature Grans (Abs): 0 10*3/uL (ref 0.0–0.1)
Immature Granulocytes: 0 %
Lymphocytes Absolute: 1.4 10*3/uL (ref 0.7–3.1)
Lymphs: 25 %
MCH: 30.8 pg (ref 26.6–33.0)
MCHC: 32.7 g/dL (ref 31.5–35.7)
MCV: 94 fL (ref 79–97)
Monocytes Absolute: 0.4 10*3/uL (ref 0.1–0.9)
Monocytes: 7 %
Neutrophils Absolute: 3.6 10*3/uL (ref 1.4–7.0)
Neutrophils: 66 %
Platelets: 306 10*3/uL (ref 150–450)
RBC: 4.48 x10E6/uL (ref 3.77–5.28)
RDW: 16.6 % — ABNORMAL HIGH (ref 11.7–15.4)
WBC: 5.5 10*3/uL (ref 3.4–10.8)

## 2020-03-08 LAB — CMP14+EGFR
ALT: 34 IU/L — ABNORMAL HIGH (ref 0–32)
AST: 24 IU/L (ref 0–40)
Albumin/Globulin Ratio: 1.6 (ref 1.2–2.2)
Albumin: 4 g/dL (ref 3.8–4.8)
Alkaline Phosphatase: 81 IU/L (ref 44–121)
BUN/Creatinine Ratio: 14 (ref 9–23)
BUN: 10 mg/dL (ref 6–20)
Bilirubin Total: 0.5 mg/dL (ref 0.0–1.2)
CO2: 25 mmol/L (ref 20–29)
Calcium: 9.5 mg/dL (ref 8.7–10.2)
Chloride: 102 mmol/L (ref 96–106)
Creatinine, Ser: 0.72 mg/dL (ref 0.57–1.00)
Globulin, Total: 2.5 g/dL (ref 1.5–4.5)
Glucose: 110 mg/dL — ABNORMAL HIGH (ref 65–99)
Potassium: 4.5 mmol/L (ref 3.5–5.2)
Sodium: 143 mmol/L (ref 134–144)
Total Protein: 6.5 g/dL (ref 6.0–8.5)
eGFR: 111 mL/min/{1.73_m2} (ref >59)

## 2020-03-08 LAB — BAYER DCA HB A1C WAIVED: HB A1C (BAYER DCA - WAIVED): 6.8 % (ref <7.0)

## 2020-03-11 NOTE — Progress Notes (Signed)
R/c about results

## 2020-03-14 DIAGNOSIS — G4733 Obstructive sleep apnea (adult) (pediatric): Secondary | ICD-10-CM | POA: Diagnosis not present

## 2020-03-15 LAB — SPECIMEN STATUS REPORT

## 2020-03-15 LAB — THYROID PANEL WITH TSH
Free Thyroxine Index: 2.3 (ref 1.2–4.9)
T3 Uptake Ratio: 25 % (ref 24–39)
T4, Total: 9.3 ug/dL (ref 4.5–12.0)
TSH: 4.15 u[IU]/mL (ref 0.450–4.500)

## 2020-03-18 ENCOUNTER — Telehealth: Payer: Medicare Other

## 2020-04-07 ENCOUNTER — Encounter: Payer: Self-pay | Admitting: *Deleted

## 2020-04-07 ENCOUNTER — Ambulatory Visit (INDEPENDENT_AMBULATORY_CARE_PROVIDER_SITE_OTHER): Payer: Medicare Other | Admitting: *Deleted

## 2020-04-07 DIAGNOSIS — E1169 Type 2 diabetes mellitus with other specified complication: Secondary | ICD-10-CM | POA: Diagnosis not present

## 2020-04-07 DIAGNOSIS — E890 Postprocedural hypothyroidism: Secondary | ICD-10-CM | POA: Diagnosis not present

## 2020-04-07 DIAGNOSIS — Q909 Down syndrome, unspecified: Secondary | ICD-10-CM

## 2020-04-07 NOTE — Patient Instructions (Signed)
Visit Information  PATIENT GOALS: Goals Addressed            This Visit's Progress   . COMPLETED: Medication Management: Hypothyroidism       Timeframe:  Short-Term Goal Priority:  High Start Date: 02/08/20                            Expected End Date:  04/07/20                     . Have a follow-up visit with PCP . Take levothyroxine as prescribed . Call PCP [phone] with any questions or concerns regarding thyroid management . Call CCM team as needed    . Monitor and Manage My Blood Sugar-Diabetes Type 2   On track    Timeframe:  Long-Range Goal Priority:  Medium Start Date:  02/08/20                           Expected End Date:  08/07/20                    Follow-up Date: 06/2020   . check blood sugar at prescribed times . check blood sugar if I feel it is too high or too low . enter blood sugar readings and medication or insulin into daily log . take the blood sugar log to all doctor visits  . Call PCP with any readings outside of recommended range . Make healthy food choices and limit carbohydrates . Take medication as prescribed . Schedule follow-up with PCP for May 2022   Why is this important?    Checking your blood sugar at home helps to keep it from getting very high or very low.   Writing the results in a diary or log helps the doctor know how to care for you.   Your blood sugar log should have the time, date and the results.   Also, write down the amount of insulin or other medicine that you take.   Other information, like what you ate, exercise done and how you were feeling, will also be helpful.     Notes:        Patient verbalizes understanding of instructions provided today and agrees to view in [place].   Follow Up Plan:  . CCM team will follow-up with patient/mother over the next 60 days . The patient has been provided with contact information for the care management team and has been advised to call with any health related questions or  concerns.  . Follow-up with PCP in May 2022 pending scheduling   Chong Sicilian, BSN, RN-BC [place] [facility] [place] Management Direct Dial: [phone]

## 2020-04-07 NOTE — Chronic Care Management (AMB) (Signed)
Chronic Care Management   CCM RN Visit Note  04/07/2020 Name: Julie Carter MRN: [id number] DOB: 12/02/1985  Subjective: Julie Carter is a 35 y.o. year old female who is a primary care patient of Julie Balloon, FNP. The care management team was consulted for assistance with disease management and care coordination needs.    Engaged with patient's mother, Julie Carter, by telephone for follow up visit in response to provider referral for case management and/or care coordination services.   Consent to Services:  The patient was given information about Chronic Care Management services, agreed to services, and gave verbal consent prior to initiation of services.  Please see initial visit note for detailed documentation.   Patient agreed to services and verbal consent obtained.   Assessment: Review of patient past medical history, allergies, medications, health status, including review of consultants reports, laboratory and other test data, was performed as part of comprehensive evaluation and provision of chronic care management services.   SDOH (Social Determinants of Health) assessments and interventions performed:    CCM Care Plan  No Known Allergies  Outpatient Encounter Medications as of 04/07/2020  Medication Sig  . DENTA 5000 PLUS 1.1 % CREA dental cream Take by mouth as directed.  . Empagliflozin-metFORMIN HCl ER (SYNJARDY XR) 12.05-998 MG TB24 Take 2 tablets by mouth daily.  [name] glucose blood test strip Use as instructed  . levothyroxine (SYNTHROID) 125 MCG tablet Take 1 tablet (125 mcg total) by mouth daily.  . Multiple Vitamins-Minerals (TROPICAL LIQUID NUTRITION) LIQD Take 5 mLs by mouth daily.  . Vitamin D, Ergocalciferol, (DRISDOL) 1.25 MG (50000 UNIT) CAPS capsule TAKE 1 CAPSULE BY MOUTH EVERY 7 DAYS  . XARELTO 10 MG TABS tablet Take 10 mg by mouth daily.  [name] zinc sulfate 220 (50 Zn) MG capsule Take 1 capsule by mouth daily.   No facility-administered encounter medications  on file as of 04/07/2020.    Patient Active Problem List   Diagnosis Date Noted  . Morbid obesity with BMI of 50.0-59.9, adult ([place]) 03/22/2015  . Hypochloremia   . Vitamin D deficiency 02/03/2014  . Down syndrome 02/02/2014  . Hypothyroidism, postradioiodine therapy 10/03/2012  . Diabetes mellitus ([place]) 10/03/2012    Conditions to be addressed/monitored:DMII and hypothyroidism and Down's Syndrome  Care Plan : RNCM: Hypothyroidism  Updates made by Julie China, RN since 04/07/2020 12:00 AM    Problem: Hypothyroidism   Priority: Medium    Goal: Medication Management Completed 04/07/2020  Start Date: 02/08/2020  This Visit's Progress: On track  Recent Progress: Not on track  Priority: High  Note:   Current Barriers:  . Chronic Disease Management support and education needs related to hypothyroidism . Cognitive Deficits r/t Down's Syndrome . Unable to perform ADLs and IADLs independently   Nurse Case Manager Clinical Goal(s):  . patient will work with PCP to address needs related to hypothyroidism . patient will meet with RN Care Manager to address self-management of hypothyroidism  Interventions:  . 1:1 collaboration with Julie Balloon, FNP regarding development and update of comprehensive plan of care as evidenced by provider attestation and co-signature . Inter-disciplinary care team collaboration (see longitudinal plan of care) . Evaluation of current treatment plan related to hypothyroidism and patient's adherence to plan as established by provider. . Chart reviewed including discharge notes from [facility] and recent lab results . Previously talked with Mother, Julie Carter, about TSH of 0.09 while hospitalized . Reviewed and discussed medications o Levothyroxine decreased by PCP and  TSH now within normal limits . Advised mother that patient is due for a routine f/u with PCP in May 2022 . Collaborated with [facility] front office staff to schedule patient a routine f/u with  Julie Dun, FNP in May 2022 . Encouraged mother to reach out to PCP or CCM team as needed  Patient Goals/Self-Care Activities Over the next 60 days, patient will: . Have a follow-up visit with PCP . Take levothyroxine as prescribed . Call PCP [phone] with any questions or concerns regarding thyroid management . Call CCM team as needed    Care Plan : RNCM: Diabetes Type 2 (Adult)  Updates made by Julie China, RN since 04/07/2020 12:00 AM    Problem: Glycemic Management (Diabetes, Type 2)   Priority: Medium    Long-Range Goal: Glycemic Management Optimized   This Visit's Progress: On track  Recent Progress: On track  Priority: Medium  Note:   Current Barriers:  . Chronic Disease Management support and education needs related to diabetes . Unable to perform ADLs independently . Unable to perform IADLs independently  Nurse Case Manager Clinical Goal(s):  [name] Patient will work with RN Care Manager to address needs related to self-management of diabetes . Patient will demonstrate ongoing self health care management ability as evidenced by an A1C of <7 . Patient will work with PCP to address needs related to medical management of diabetes  Interventions:  . 1:1 collaboration with Julie Balloon, FNP regarding development and update of comprehensive plan of care as evidenced by provider attestation and co-signature . Inter-disciplinary care team collaboration (see longitudinal plan of care) . Evaluation of current treatment plan related to diabetes and patient's adherence to plan as established by provider. Julie Carter with [facility] front office staff regarding patient appointment scheduling for follow-up with PCP in May 2022 . Advised patient, providing education and rationale, to check cbg daily as directed and record, calling [phone] for findings outside established parameters.   . Discussed plans with patient for ongoing care management follow up and provided patient  with direct contact information for care management team . Chart reviewed including relevant office notes and lab results . Reviewed Medications . Encouraged mother/patient to reach out to [facility] team as needed  Patient Goals/Self-Care Activities Over the next 60 days, patient will: . check blood sugar at prescribed times . check blood sugar if I feel it is too high or too low . enter blood sugar readings and medication or insulin into daily log . take the blood sugar log to all doctor visits  . Call PCP with any readings outside of recommended range . Make healthy food choices and limit carbohydrates . Take medication as prescribed . Schedule follow-up with PCP for May 2022    Follow Up Plan:  . CCM team will follow-up with patient/mother over the next 60 days . The patient has been provided with contact information for the care management team and has been advised to call with any health related questions or concerns.  . Follow-up with PCP in May 2022 pending scheduling   Chong Sicilian, BSN, RN-BC [place] [facility] [place] Management Direct Dial: [phone]

## 2020-04-15 ENCOUNTER — Other Ambulatory Visit: Payer: Self-pay | Admitting: Family

## 2020-04-15 DIAGNOSIS — E1169 Type 2 diabetes mellitus with other specified complication: Secondary | ICD-10-CM

## 2020-04-21 ENCOUNTER — Ambulatory Visit: Payer: Medicare Other | Admitting: Licensed Clinical Social Worker

## 2020-04-21 DIAGNOSIS — Q909 Down syndrome, unspecified: Secondary | ICD-10-CM

## 2020-04-21 DIAGNOSIS — E890 Postprocedural hypothyroidism: Secondary | ICD-10-CM | POA: Diagnosis not present

## 2020-04-21 DIAGNOSIS — E559 Vitamin D deficiency, unspecified: Secondary | ICD-10-CM

## 2020-04-21 DIAGNOSIS — E1169 Type 2 diabetes mellitus with other specified complication: Secondary | ICD-10-CM

## 2020-04-21 NOTE — Chronic Care Management (AMB) (Signed)
Chronic Care Management    Clinical Social Work Note  04/21/2020 Name: Julie Carter MRN: [id number] DOB: December 07, 1985  Julie Carter is a 35 y.o. year old female who is a primary care patient of Julie Balloon, FNP. The CCM team was consulted to assist the patient with chronic disease management and/or care coordination needs related to: [name]   Engaged with patient / Julie Carter , mother of patient, by telephone for follow up visit in response to provider referral for social work chronic care management and care coordination services.   Consent to Services:  The patient was given information about Chronic Care Management services, agreed to services, and gave verbal consent prior to initiation of services.  Please see initial visit note for detailed documentation.   Patient agreed to services and consent obtained.   Assessment: Review of patient past medical history, allergies, medications, and health status, including review of relevant consultants reports was performed today as part of a comprehensive evaluation and provision of chronic care management and care coordination services.     SDOH (Social Determinants of Health) assessments and interventions performed:  SDOH Interventions   Flowsheet Row Most Recent Value  SDOH Interventions   Depression Interventions/Treatment  --  [informed client and her mother of LCSW support and of RNCM support]       Advanced Directives Status: See Vynca application for related entries.  CCM Care Plan  No Known Allergies  Outpatient Encounter Medications as of 04/21/2020  Medication Sig  . DENTA 5000 PLUS 1.1 % CREA dental cream Take by mouth as directed.  . Empagliflozin-metFORMIN HCl ER (SYNJARDY XR) 12.05-998 MG TB24 Take 2 tablets by mouth daily. (NEEDS TO BE SEEN BEFORE NEXT REFILL)  . glucose blood test strip Use as instructed  . levothyroxine (SYNTHROID) 125 MCG tablet Take 1 tablet (125 mcg total) by mouth daily.   . Multiple Vitamins-Minerals (TROPICAL LIQUID NUTRITION) LIQD Take 5 mLs by mouth daily.  . Vitamin D, Ergocalciferol, (DRISDOL) 1.25 MG (50000 UNIT) CAPS capsule TAKE 1 CAPSULE BY MOUTH EVERY 7 DAYS  . XARELTO 10 MG TABS tablet Take 10 mg by mouth daily.  Julie Carter 220 (50 Zn) MG capsule Take 1 capsule by mouth daily.   No facility-administered encounter medications on file as of 04/21/2020.    Patient Active Problem List   Diagnosis Date Noted  . Morbid obesity with BMI of 50.0-59.9, adult ([place]) 03/22/2015  . Hypochloremia   . Vitamin D deficiency 02/03/2014  . Down syndrome 02/02/2014  . Hypothyroidism, postradioiodine therapy 10/03/2012  . Diabetes mellitus ([place]) 10/03/2012    Conditions to be addressed/monitored: Monitor client completion of ADLs  Care Plan : LCSW care plan  Updates made by [name], LCSW since 04/21/2020 12:00 AM    Problem: Coping Skills (General Plan of Care)     Goal: Coping Skills Enhanced;Complete ADLs as able   Start Date: 04/21/2020  Expected End Date: 07/21/2020  This Visit's Progress: On track  Priority: Medium  Note:   Current barriers:   . Patient in need of assistance with connecting to community resources for possible help in completing daily ADLs and daily activities of client . Patient is unable to independently navigate community resource options without care coordination support . Down's Syndrome  Clinical Goals:  patient will work with SW in next 30 days to address concerns related to ADLs completion of client Patient will attend all scheduled medical appointments in next 30 days  Clinical Interventions:  . Collaboration with Julie Balloon, FNP regarding development and update of comprehensive plan of care as evidenced by provider attestation and co-signature . Assessment of needs, barriers of client . Talked with Julie Carter, mother of client, about ambulation of client . Talked with [name] about sleeping issues of  client  . Talked with [name]  (watches TV, plays computer games, watches videos) . Talked with [name] about family support for client . Talked with [name] about vision needs of client . Encouraged client or [name] to call RNCM as needed for nursing support  Patient Coping Skills: Has support from her mother Eats meals with set up assistance Completes ADLs as able  Patient Deficits: Down's syndrome Decrease energy  Patient Goals:  Client will attend scheduled medical appointments in next 30 days Client will communicate regularly with her mother in next 30 days to discuss needs of client Client will complete ADLs as able in next 30 days -  Follow Up Plan: LCSW to call client or her mother on 05/27/20     Julie Riffle.Loris Winrow MSW, LCSW Licensed Clinical Social Worker [facility] Care Management [phone]

## 2020-04-21 NOTE — Patient Instructions (Signed)
Visit Information  PATIENT GOALS: Goals Addressed            This Visit's Progress   . Protect My Health       Timeframe:  Short-Term Goal Priority:  Medium Progress: On Track Start Date:             04/21/20                Expected End Date:           07/21/20            Follow Up Date 05/27/20    Protect My Health (Patient)   Complete ADLs daily as able    Why is this important?    Screening tests can find diseases early when they are easier to treat.   Your doctor or nurse will talk with you about which tests are important for you.   Getting shots for common diseases like the flu and shingles will help prevent them.     Patient Coping Skills: Has support from her mother Eats meals with set up assistance Completes ADLs as able  Patient Deficits: Down's syndrome Decrease energy  Patient Goals:  Client will attend scheduled medical appointments in next 30 days Client will communicate regularly with her mother in next 30 days to discuss needs of client Client will complete ADLs as able in next 30 days -  Follow Up Plan: LCSW to call client or her mother on 05/27/20       [name].Shanigua Gibb MSW, LCSW Licensed Clinical Social Worker [facility] Care Management [phone]

## 2020-04-28 ENCOUNTER — Other Ambulatory Visit: Payer: Self-pay

## 2020-04-28 ENCOUNTER — Ambulatory Visit (INDEPENDENT_AMBULATORY_CARE_PROVIDER_SITE_OTHER): Payer: Medicare Other | Admitting: Family

## 2020-04-28 ENCOUNTER — Encounter: Payer: Self-pay | Admitting: Family

## 2020-04-28 VITALS — BP 129/83 | HR 62 | Temp 98.0°F | Ht 59.0 in | Wt 220.0 lb

## 2020-04-28 DIAGNOSIS — Z0001 Encounter for general adult medical examination with abnormal findings: Secondary | ICD-10-CM | POA: Diagnosis not present

## 2020-04-28 DIAGNOSIS — L250 Unspecified contact dermatitis due to cosmetics: Secondary | ICD-10-CM | POA: Diagnosis not present

## 2020-04-28 DIAGNOSIS — E890 Postprocedural hypothyroidism: Secondary | ICD-10-CM | POA: Diagnosis not present

## 2020-04-28 DIAGNOSIS — E559 Vitamin D deficiency, unspecified: Secondary | ICD-10-CM

## 2020-04-28 DIAGNOSIS — Z1159 Encounter for screening for other viral diseases: Secondary | ICD-10-CM | POA: Diagnosis not present

## 2020-04-28 DIAGNOSIS — Z6841 Body Mass Index (BMI) 40.0 and over, adult: Secondary | ICD-10-CM

## 2020-04-28 DIAGNOSIS — Q909 Down syndrome, unspecified: Secondary | ICD-10-CM | POA: Diagnosis not present

## 2020-04-28 DIAGNOSIS — E1169 Type 2 diabetes mellitus with other specified complication: Secondary | ICD-10-CM | POA: Diagnosis not present

## 2020-04-28 MED ORDER — BLOOD GLUCOSE METER KIT: PACK | 0 refills | Status: DC

## 2020-04-28 MED ORDER — TRIAMCINOLONE ACETONIDE 0.5 % EX OINT: 1.0000 "application " | TOPICAL_OINTMENT | Freq: Two times a day (BID) | CUTANEOUS | 0 refills | Status: DC

## 2020-04-28 NOTE — Patient Instructions (Signed)
Diabetes Mellitus and Nutrition, Adult When you have diabetes, or diabetes mellitus, it is very important to have healthy eating habits because your blood sugar (glucose) levels are greatly affected by what you eat and drink. Eating healthy foods in the right amounts, at about the same times every day, can help you:  Control your blood glucose.  Lower your risk of heart disease.  Improve your blood pressure.  Reach or maintain a healthy weight. What can affect my meal plan? Every person with diabetes is different, and each person has different needs for a meal plan. Your health care provider may recommend that you work with a dietitian to make a meal plan that is best for you. Your meal plan may vary depending on factors such as:  The calories you need.  The medicines you take.  Your weight.  Your blood glucose, blood pressure, and cholesterol levels.  Your activity level.  Other health conditions you have, such as heart or kidney disease. How do carbohydrates affect me? Carbohydrates, also called carbs, affect your blood glucose level more than any other type of food. Eating carbs naturally raises the amount of glucose in your blood. Carb counting is a method for keeping track of how many carbs you eat. Counting carbs is important to keep your blood glucose at a healthy level, especially if you use insulin or take certain oral diabetes medicines. It is important to know how many carbs you can safely have in each meal. This is different for every person. Your dietitian can help you calculate how many carbs you should have at each meal and for each snack. How does alcohol affect me? Alcohol can cause a sudden decrease in blood glucose (hypoglycemia), especially if you use insulin or take certain oral diabetes medicines. Hypoglycemia can be a life-threatening condition. Symptoms of hypoglycemia, such as sleepiness, dizziness, and confusion, are similar to symptoms of having too much  alcohol.  Do not drink alcohol if: ? Your health care provider tells you not to drink. ? You are pregnant, may be pregnant, or are planning to become pregnant.  If you drink alcohol: ? Do not drink on an empty stomach. ? Limit how much you use to:  0-1 drink a day for women.  0-2 drinks a day for men. ? Be aware of how much alcohol is in your drink. In the [place] one drink equals one 12 oz bottle of beer (355 mL), one 5 oz glass of wine (148 mL), or one 1 oz glass of hard liquor (44 mL). ? Keep yourself hydrated with water, diet soda, or unsweetened iced tea.  Keep in mind that regular soda, juice, and other mixers may contain a lot of sugar and must be counted as carbs. What are tips for following this plan? Reading food labels  Start by checking the serving size on the "Nutrition Facts" label of packaged foods and drinks. The amount of calories, carbs, fats, and other nutrients listed on the label is based on one serving of the item. Many items contain more than one serving per package.  Check the total grams (g) of carbs in one serving. You can calculate the number of servings of carbs in one serving by dividing the total carbs by 15. For example, if a food has 30 g of total carbs per serving, it would be equal to 2 servings of carbs.  Check the number of grams (g) of saturated fats and trans fats in one serving. Choose foods that have   a low amount or none of these fats.  Check the number of milligrams (mg) of salt (sodium) in one serving. Most people should limit total sodium intake to less than 2,300 mg per day.  Always check the nutrition information of foods labeled as "low-fat" or "nonfat." These foods may be higher in added sugar or refined carbs and should be avoided.  Talk to your dietitian to identify your daily goals for nutrients listed on the label. Shopping  Avoid buying canned, pre-made, or processed foods. These foods tend to be high in fat, sodium, and added  sugar.  Shop around the outside edge of the grocery store. This is where you will most often find fresh fruits and vegetables, bulk grains, fresh meats, and fresh dairy. Cooking  Use low-heat cooking methods, such as baking, instead of high-heat cooking methods like deep frying.  Cook using healthy oils, such as olive, canola, or sunflower oil.  Avoid cooking with butter, cream, or high-fat meats. Meal planning  Eat meals and snacks regularly, preferably at the same times every day. Avoid going long periods of time without eating.  Eat foods that are high in fiber, such as fresh fruits, vegetables, beans, and whole grains. Talk with your dietitian about how many servings of carbs you can eat at each meal.  Eat 4-6 oz (112-168 g) of lean protein each day, such as lean meat, chicken, fish, eggs, or tofu. One ounce (oz) of lean protein is equal to: ? 1 oz (28 g) of meat, chicken, or fish. ? 1 egg. ?  cup (62 g) of tofu.  Eat some foods each day that contain healthy fats, such as avocado, nuts, seeds, and fish.   What foods should I eat? Fruits Berries. Apples. Oranges. Peaches. Apricots. Plums. Grapes. Mango. Papaya. Pomegranate. Kiwi. Cherries. Vegetables Lettuce. Spinach. Leafy greens, including kale, chard, collard greens, and mustard greens. Beets. Cauliflower. Cabbage. Broccoli. Carrots. Green beans. Tomatoes. Peppers. Onions. Cucumbers. Brussels sprouts. Grains Whole grains, such as whole-wheat or whole-grain bread, crackers, tortillas, cereal, and pasta. Unsweetened oatmeal. Quinoa. Brown or wild rice. Meats and other proteins Seafood. Poultry without skin. Lean cuts of poultry and beef. Tofu. Nuts. Seeds. Dairy Low-fat or fat-free dairy products such as milk, yogurt, and cheese. The items listed above may not be a complete list of foods and beverages you can eat. Contact a dietitian for more information. What foods should I avoid? Fruits Fruits canned with  syrup. Vegetables Canned vegetables. Frozen vegetables with butter or cream sauce. Grains Refined white flour and flour products such as bread, pasta, snack foods, and cereals. Avoid all processed foods. Meats and other proteins Fatty cuts of meat. Poultry with skin. Breaded or fried meats. Processed meat. Avoid saturated fats. Dairy Full-fat yogurt, cheese, or milk. Beverages Sweetened drinks, such as soda or iced tea. The items listed above may not be a complete list of foods and beverages you should avoid. Contact a dietitian for more information. Questions to ask a health care provider  Do I need to meet with a diabetes educator?  Do I need to meet with a dietitian?  What number can I call if I have questions?  When are the best times to check my blood glucose? Where to find more information:  American Diabetes Association: diabetes.org  Academy of Nutrition and Dietetics: www.eatright.org  National Institute of Diabetes and Digestive and Kidney Diseases: www.niddk.nih.gov  Association of Diabetes Care and Education Specialists: www.diabeteseducator.org Summary  It is important to have healthy eating   habits because your blood sugar (glucose) levels are greatly affected by what you eat and drink.  A healthy meal plan will help you control your blood glucose and maintain a healthy lifestyle.  Your health care provider may recommend that you work with a dietitian to make a meal plan that is best for you.  Keep in mind that carbohydrates (carbs) and alcohol have immediate effects on your blood glucose levels. It is important to count carbs and to use alcohol carefully. This information is not intended to replace advice given to you by your health care provider. Make sure you discuss any questions you have with your health care provider. Document Revised: 11/25/2018 Document Reviewed: 11/25/2018 Elsevier Patient Education  2021 Elsevier Inc.  

## 2020-04-28 NOTE — Progress Notes (Signed)
Subjective:    Patient ID: Julie Carter, female    DOB: October 20, 1985, 35 y.o.   MRN: [id number]  Chief Complaint  Patient presents with  . Annual Exam    Checkup   Pt presents to the office today for CPE. She is followed by Endocrinologists.  She is complaining of hair loss. She did have COVID in 01/2020.  Diabetes She presents for her follow-up diabetic visit. She has type 2 diabetes mellitus. Her disease course has been stable. There are no hypoglycemic associated symptoms. Pertinent negatives for diabetes include no foot paresthesias, no foot ulcerations and no visual change. Symptoms are stable. Risk factors for coronary artery disease include dyslipidemia, diabetes mellitus, hypertension and sedentary lifestyle. She is following a generally unhealthy diet. (Does not check at home)  Thyroid Problem Presents for follow-up visit. Symptoms include dry skin. Patient reports no constipation, depressed mood, diaphoresis or visual change. The symptoms have been stable.  Rash This is a new problem. The current episode started 1 to 4 weeks ago. The problem has been waxing and waning since onset. Location: axilla       Review of Systems  Constitutional: Negative for diaphoresis.  Gastrointestinal: Negative for constipation.  Skin: Positive for rash.  All other systems reviewed and are negative.  Family History  Problem Relation Age of Onset  . Diabetes Mother   . Hypertension Mother   . Hypertension Father    Social History   Socioeconomic History  . Marital status: Single    Spouse name: Not on file  . Number of children: 0  . Years of education: 10  . Highest education level: 10th grade  Occupational History  . Occupation: Disabled  Tobacco Use  . Smoking status: Never Smoker  . Smokeless tobacco: Never Used  Vaping Use  . Vaping Use: Never used  Substance and Sexual Activity  . Alcohol use: No    Alcohol/week: 0.0 standard drinks  . Drug use: No  . Sexual activity:  Never  Other Topics Concern  . Not on file  Social History Narrative   Pt has Downs Syndrome and lives at home with her parents who take care of everything for her.   Social Determinants of Health   Financial Resource Strain: Not on file  Food Insecurity: Not on file  Transportation Needs: Not on file  Physical Activity: Not on file  Stress: Not on file  Social Connections: Not on file       Objective:   Physical Exam Vitals reviewed.  Constitutional:      General: She is not in acute distress.    Appearance: She is well-developed. She is obese.  HENT:     Head: Atraumatic. Microcephalic.     Right Ear: External ear normal.  Eyes:     Pupils: Pupils are equal, round, and reactive to light.  Neck:     Thyroid: No thyromegaly.  Cardiovascular:     Rate and Rhythm: Normal rate and regular rhythm.     Heart sounds: Normal heart sounds. No murmur heard.   Pulmonary:     Effort: Pulmonary effort is normal. No respiratory distress.     Breath sounds: Normal breath sounds. No wheezing.  Abdominal:     General: Bowel sounds are normal. There is no distension.     Palpations: Abdomen is soft.     Tenderness: There is no abdominal tenderness.  Musculoskeletal:        General: No tenderness. Normal range of motion.  Cervical back: Normal range of motion and neck supple.  Skin:    General: Skin is warm and dry.          Comments: Erythemas in right axilla   Neurological:     Mental Status: She is alert and oriented to person, place, and time.     Cranial Nerves: No cranial nerve deficit.     Deep Tendon Reflexes: Reflexes are normal and symmetric.  Psychiatric:        Behavior: Behavior normal.        Thought Content: Thought content normal.        Judgment: Judgment normal.      BP 129/83   Pulse 62   Temp 98 F (36.7 C) (Temporal)   Ht 4' 11"  (1.499 m)   Wt 220 lb (99.8 kg)   BMI 44.43 kg/m      Assessment & Plan:  Julie Carter comes in today with  chief complaint of Annual Exam (Checkup)   Diagnosis and orders addressed:  1. Type 2 diabetes mellitus with other specified complication, without long-term current use of insulin (HCC) - CMP14+EGFR - CBC with Differential/Platelet - Lipid panel - Microalbumin / creatinine urine ratio - blood glucose meter kit and supplies; Dispense based on patient and insurance preference. Use up to four times daily as directed. (FOR ICD-10 E10.9, E11.9).  Dispense: 1 each; Refill: 0  2. Hypothyroidism, postradioiodine therapy - CMP14+EGFR - CBC with Differential/Platelet - TSH  3. Down syndrome - CMP14+EGFR - CBC with Differential/Platelet  4. Morbid obesity with BMI of 50.0-59.9, adult (HCC) - CMP14+EGFR - CBC with Differential/Platelet  5. Vitamin D deficiency - CMP14+EGFR - CBC with Differential/Platelet  6. Need for hepatitis C screening test - CMP14+EGFR - CBC with Differential/Platelet - Hepatitis C antibody  7. Contact dermatitis due to cosmetics, unspecified contact dermatitis type Use as needed Stop using deodorant, buy new brand - triamcinolone ointment (KENALOG) 0.5 %; Apply 1 application topically 2 (two) times daily.  Dispense: 30 g; Refill: 0   Labs pending Health Maintenance reviewed Diet and exercise encouraged  Follow up plan: 4 months    Evelina Dun, FNP

## 2020-04-29 LAB — LIPID PANEL
Chol/HDL Ratio: 3.2 ratio (ref 0.0–4.4)
Cholesterol, Total: 202 mg/dL — ABNORMAL HIGH (ref 100–199)
HDL: 64 mg/dL (ref >39)
LDL Chol Calc (NIH): 109 mg/dL — ABNORMAL HIGH (ref 0–99)
Triglycerides: 171 mg/dL — ABNORMAL HIGH (ref 0–149)
VLDL Cholesterol Cal: 29 mg/dL (ref 5–40)

## 2020-04-29 LAB — CBC WITH DIFFERENTIAL/PLATELET
Basophils Absolute: 0.1 10*3/uL (ref 0.0–0.2)
Basos: 1 %
EOS (ABSOLUTE): 0.1 10*3/uL (ref 0.0–0.4)
Eos: 1 %
Hematocrit: 43.4 % (ref 34.0–46.6)
Hemoglobin: 14.5 g/dL (ref 11.1–15.9)
Immature Grans (Abs): 0 10*3/uL (ref 0.0–0.1)
Immature Granulocytes: 0 %
Lymphocytes Absolute: 1.8 10*3/uL (ref 0.7–3.1)
Lymphs: 27 %
MCH: 31.8 pg (ref 26.6–33.0)
MCHC: 33.4 g/dL (ref 31.5–35.7)
MCV: 95 fL (ref 79–97)
Monocytes Absolute: 0.5 10*3/uL (ref 0.1–0.9)
Monocytes: 8 %
Neutrophils Absolute: 4.2 10*3/uL (ref 1.4–7.0)
Neutrophils: 63 %
Platelets: 370 10*3/uL (ref 150–450)
RBC: 4.56 x10E6/uL (ref 3.77–5.28)
RDW: 15.3 % (ref 11.7–15.4)
WBC: 6.7 10*3/uL (ref 3.4–10.8)

## 2020-04-29 LAB — MICROALBUMIN / CREATININE URINE RATIO
Creatinine, Urine: 44.3 mg/dL
Microalb/Creat Ratio: <7 mg/g creat (ref 0–29)
Microalbumin, Urine: <3 ug/mL

## 2020-04-29 LAB — CMP14+EGFR
ALT: 29 IU/L (ref 0–32)
AST: 19 IU/L (ref 0–40)
Albumin/Globulin Ratio: 1.7 (ref 1.2–2.2)
Albumin: 4.6 g/dL (ref 3.8–4.8)
Alkaline Phosphatase: 84 IU/L (ref 44–121)
BUN/Creatinine Ratio: 15 (ref 9–23)
BUN: 12 mg/dL (ref 6–20)
Bilirubin Total: 0.3 mg/dL (ref 0.0–1.2)
CO2: 24 mmol/L (ref 20–29)
Calcium: 10.2 mg/dL (ref 8.7–10.2)
Chloride: 101 mmol/L (ref 96–106)
Creatinine, Ser: 0.78 mg/dL (ref 0.57–1.00)
Globulin, Total: 2.7 g/dL (ref 1.5–4.5)
Glucose: 92 mg/dL (ref 65–99)
Potassium: 4.5 mmol/L (ref 3.5–5.2)
Sodium: 144 mmol/L (ref 134–144)
Total Protein: 7.3 g/dL (ref 6.0–8.5)
eGFR: 101 mL/min/{1.73_m2} (ref >59)

## 2020-04-29 LAB — TSH: TSH: 1.52 u[IU]/mL (ref 0.450–4.500)

## 2020-04-29 LAB — HEPATITIS C ANTIBODY: Hep C Virus Ab: <0.1 s/co ratio (ref 0.0–0.9)

## 2020-05-11 ENCOUNTER — Encounter: Payer: Self-pay | Admitting: Family Medicine

## 2020-05-23 ENCOUNTER — Other Ambulatory Visit: Payer: Self-pay | Admitting: Family

## 2020-05-24 ENCOUNTER — Other Ambulatory Visit: Payer: Self-pay | Admitting: Family

## 2020-05-24 DIAGNOSIS — E1169 Type 2 diabetes mellitus with other specified complication: Secondary | ICD-10-CM

## 2020-05-27 ENCOUNTER — Telehealth: Payer: Medicare Other

## 2020-07-09 ENCOUNTER — Other Ambulatory Visit: Payer: Self-pay | Admitting: Family

## 2020-07-09 DIAGNOSIS — E1169 Type 2 diabetes mellitus with other specified complication: Secondary | ICD-10-CM

## 2020-07-10 ENCOUNTER — Other Ambulatory Visit: Payer: Self-pay | Admitting: Endocrinology

## 2020-07-10 DIAGNOSIS — E119 Type 2 diabetes mellitus without complications: Secondary | ICD-10-CM

## 2020-07-11 ENCOUNTER — Other Ambulatory Visit: Payer: Self-pay | Admitting: Family

## 2020-07-11 DIAGNOSIS — E559 Vitamin D deficiency, unspecified: Secondary | ICD-10-CM

## 2020-07-12 ENCOUNTER — Telehealth: Payer: Medicare Other

## 2020-08-18 ENCOUNTER — Telehealth: Payer: Medicare Other

## 2020-08-23 DIAGNOSIS — G4733 Obstructive sleep apnea (adult) (pediatric): Secondary | ICD-10-CM | POA: Diagnosis not present

## 2020-09-11 ENCOUNTER — Other Ambulatory Visit: Payer: Self-pay | Admitting: Family

## 2020-09-11 DIAGNOSIS — E1169 Type 2 diabetes mellitus with other specified complication: Secondary | ICD-10-CM

## 2020-09-28 ENCOUNTER — Telehealth: Payer: Medicare Other

## 2020-10-04 ENCOUNTER — Other Ambulatory Visit: Payer: Self-pay | Admitting: Family

## 2020-10-04 DIAGNOSIS — E559 Vitamin D deficiency, unspecified: Secondary | ICD-10-CM

## 2020-10-10 ENCOUNTER — Other Ambulatory Visit: Payer: Self-pay | Admitting: Family

## 2020-10-10 DIAGNOSIS — E1169 Type 2 diabetes mellitus with other specified complication: Secondary | ICD-10-CM

## 2020-10-10 NOTE — Telephone Encounter (Signed)
[facility]. NTBS 30 days given 09/12/20

## 2020-10-18 ENCOUNTER — Telehealth: Payer: Self-pay | Admitting: Family

## 2020-10-18 ENCOUNTER — Other Ambulatory Visit: Payer: Self-pay

## 2020-10-18 ENCOUNTER — Other Ambulatory Visit: Payer: Medicare Other

## 2020-10-18 DIAGNOSIS — E890 Postprocedural hypothyroidism: Secondary | ICD-10-CM

## 2020-10-18 DIAGNOSIS — E1169 Type 2 diabetes mellitus with other specified complication: Secondary | ICD-10-CM

## 2020-10-18 LAB — BAYER DCA HB A1C WAIVED: HB A1C (BAYER DCA - WAIVED): 6.5 % — ABNORMAL HIGH (ref 4.8–5.6)

## 2020-10-18 NOTE — Telephone Encounter (Signed)
Patient aware and verbalized understanding. °

## 2020-10-18 NOTE — Telephone Encounter (Signed)
Labs ordered.

## 2020-10-19 ENCOUNTER — Other Ambulatory Visit: Payer: Self-pay | Admitting: Family

## 2020-10-19 LAB — CMP14+EGFR
ALT: 28 IU/L (ref 0–32)
AST: 21 IU/L (ref 0–40)
Albumin/Globulin Ratio: 1.7 (ref 1.2–2.2)
Albumin: 4.4 g/dL (ref 3.8–4.8)
Alkaline Phosphatase: 89 IU/L (ref 44–121)
BUN/Creatinine Ratio: 11 (ref 9–23)
BUN: 10 mg/dL (ref 6–20)
Bilirubin Total: 0.2 mg/dL (ref 0.0–1.2)
CO2: 22 mmol/L (ref 20–29)
Calcium: 9.8 mg/dL (ref 8.7–10.2)
Chloride: 105 mmol/L (ref 96–106)
Creatinine, Ser: 0.87 mg/dL (ref 0.57–1.00)
Globulin, Total: 2.6 g/dL (ref 1.5–4.5)
Glucose: 100 mg/dL — ABNORMAL HIGH (ref 70–99)
Potassium: 4.3 mmol/L (ref 3.5–5.2)
Sodium: 147 mmol/L — ABNORMAL HIGH (ref 134–144)
Total Protein: 7 g/dL (ref 6.0–8.5)
eGFR: 88 mL/min/{1.73_m2} (ref >59)

## 2020-10-19 LAB — CBC WITH DIFFERENTIAL/PLATELET
Basophils Absolute: 0.1 10*3/uL (ref 0.0–0.2)
Basos: 2 %
EOS (ABSOLUTE): 0.1 10*3/uL (ref 0.0–0.4)
Eos: 1 %
Hematocrit: 44.5 % (ref 34.0–46.6)
Hemoglobin: 14.7 g/dL (ref 11.1–15.9)
Immature Grans (Abs): 0 10*3/uL (ref 0.0–0.1)
Immature Granulocytes: 1 %
Lymphocytes Absolute: 1.6 10*3/uL (ref 0.7–3.1)
Lymphs: 24 %
MCH: 30.9 pg (ref 26.6–33.0)
MCHC: 33 g/dL (ref 31.5–35.7)
MCV: 94 fL (ref 79–97)
Monocytes Absolute: 0.4 10*3/uL (ref 0.1–0.9)
Monocytes: 7 %
Neutrophils Absolute: 4.3 10*3/uL (ref 1.4–7.0)
Neutrophils: 65 %
Platelets: 330 10*3/uL (ref 150–450)
RBC: 4.75 x10E6/uL (ref 3.77–5.28)
RDW: 15.6 % — ABNORMAL HIGH (ref 11.7–15.4)
WBC: 6.6 10*3/uL (ref 3.4–10.8)

## 2020-10-19 LAB — TSH: TSH: 4.86 u[IU]/mL — ABNORMAL HIGH (ref 0.450–4.500)

## 2020-10-19 MED ORDER — LEVOTHYROXINE SODIUM 137 MCG PO TABS: 137.0000 ug | ORAL_TABLET | Freq: Every day | ORAL | 2 refills | Status: DC

## 2020-10-24 ENCOUNTER — Other Ambulatory Visit: Payer: Self-pay

## 2020-10-24 ENCOUNTER — Encounter: Payer: Self-pay | Admitting: Family

## 2020-10-24 ENCOUNTER — Ambulatory Visit (INDEPENDENT_AMBULATORY_CARE_PROVIDER_SITE_OTHER): Payer: Medicare Other | Admitting: Family

## 2020-10-24 VITALS — BP 102/67 | HR 79 | Temp 98.0°F | Ht 59.0 in | Wt 231.4 lb

## 2020-10-24 DIAGNOSIS — E1169 Type 2 diabetes mellitus with other specified complication: Secondary | ICD-10-CM | POA: Diagnosis not present

## 2020-10-24 DIAGNOSIS — Z6841 Body Mass Index (BMI) 40.0 and over, adult: Secondary | ICD-10-CM

## 2020-10-24 DIAGNOSIS — E559 Vitamin D deficiency, unspecified: Secondary | ICD-10-CM

## 2020-10-24 DIAGNOSIS — L659 Nonscarring hair loss, unspecified: Secondary | ICD-10-CM | POA: Diagnosis not present

## 2020-10-24 DIAGNOSIS — E890 Postprocedural hypothyroidism: Secondary | ICD-10-CM

## 2020-10-24 DIAGNOSIS — Q909 Down syndrome, unspecified: Secondary | ICD-10-CM

## 2020-10-24 NOTE — Progress Notes (Signed)
Subjective:    Patient ID: Julie Carter, female    DOB: 04/30/1985, 35 y.o.   MRN: [id number]  Chief Complaint  Patient presents with   Medical Management of Chronic Issues    Lossing hair    PT presents to the office today for chronic follow up. She is complaining of hair loss that started three weeks ago per mother.  However, after reviewing my notes she was complaining of hair loss on 04/28/20 visit.   She had lab work drawn on 10/18/20 that did show abnormal TSH and we increased her levothyroxine to 137 mcg. Mother states her hair loss has slightly improved, but continues to have a bald spot on the top of her head. She does have mild new growth present.  Diabetes She presents for her follow-up diabetic visit. She has type 2 diabetes mellitus. Pertinent negatives for diabetes include no blurred vision and no fatigue. Risk factors for coronary artery disease include dyslipidemia, diabetes mellitus, hypertension, sedentary lifestyle and post-menopausal. She is following a generally unhealthy diet. (Does not check BS at home) Eye exam is not current.  Thyroid Problem Presents for follow-up visit. Symptoms include dry skin and hair loss. Patient reports no constipation, fatigue or nail problem. The symptoms have been stable.     Review of Systems  Constitutional:  Negative for fatigue.  Eyes:  Negative for blurred vision.  Gastrointestinal:  Negative for constipation.  All other systems reviewed and are negative.     Objective:   Physical Exam Vitals reviewed.  Constitutional:      General: She is not in acute distress.    Appearance: She is well-developed. She is obese.  HENT:     Head: Normocephalic and atraumatic.     Right Ear: Tympanic membrane normal.     Left Ear: Tympanic membrane normal.  Eyes:     Pupils: Pupils are equal, round, and reactive to light.  Neck:     Thyroid: No thyromegaly.  Cardiovascular:     Rate and Rhythm: Normal rate and regular rhythm.      Heart sounds: Normal heart sounds. No murmur heard. Pulmonary:     Effort: Pulmonary effort is normal. No respiratory distress.     Breath sounds: Normal breath sounds. No wheezing.  Abdominal:     General: Bowel sounds are normal. There is no distension.     Palpations: Abdomen is soft.     Tenderness: There is no abdominal tenderness.  Musculoskeletal:        General: No tenderness. Normal range of motion.     Cervical back: Normal range of motion and neck supple.  Skin:    General: Skin is warm and dry.     Comments: Bald patches on scalp extending from front to back of her head. A lot of new growth on right section. See picture.   Neurological:     Mental Status: She is alert and oriented to person, place, and time.     Cranial Nerves: No cranial nerve deficit.     Deep Tendon Reflexes: Reflexes are normal and symmetric.  Psychiatric:        Behavior: Behavior normal.        Thought Content: Thought content normal.        Judgment: Judgment normal.       BP 102/67   Pulse 79   Temp 98 F (36.7 C) (Temporal)   Ht 4\' 11"  (1.499 m)   Wt 231 lb 6.4 oz (105  kg)   BMI 46.74 kg/m      Assessment & Plan:  Julie Carter comes in today with chief complaint of Medical Management of Chronic Issues (Lossing hair )   Diagnosis and orders addressed:  1. Hypothyroidism, postradioiodine therapy  2. Type 2 diabetes mellitus with other specified complication, without long-term current use of insulin ([facility])  3. Morbid obesity with BMI of 50.0-59.9, adult ([facility])  4. Vitamin D deficiency  5. Down syndrome  6. Alopecia Referral to dermatologists - Ambulatory referral to Dermatology   Labs reviewed  Health Maintenance reviewed Diet and exercise encouraged  Follow up plan: 6 months    Evelina Dun, FNP

## 2020-10-24 NOTE — Patient Instructions (Signed)
Alopecia Areata, Adult ?Alopecia areata is a condition that causes hair loss. A person with this condition may lose hair on the scalp in patches. In some cases, a person may lose all the hair on the scalp or all the hair from the face and body. Having this condition can be emotionally difficult, but it is not dangerous. ?Alopecia areata is an autoimmune disease. This means that your body's defense system (immune system) mistakes normal parts of the body for germs or other things that can make you sick. When you have alopecia areata, the immune system attacks the hair follicles. ?What are the causes? ?The cause of this condition is not known. ?What increases the risk? ?You are more likely to develop this condition if you have: ?A family history of alopecia. ?A family history of another autoimmune disease, including type 1 diabetes and thyroid autoimmune disease. ?Eczema, asthma, and allergies. ?Down syndrome. ?What are the signs or symptoms? ?The main symptom of this condition is round spots of patchy hair loss on the scalp. The spots may be mildly itchy. Other symptoms include: ?Short dark hairs in the bald patches that are wider at the top (exclamation point hairs). ?Dents, white spots, or lines in the fingernails or toenails. ?Balding and body hair loss. This is rare. ?Alopecia areata usually develops in childhood, but it can develop at any age. For some people, their hair grows back on its own and hair loss does not happen again. For others, their hair may fall out and grow back in cycles. The hair loss may last many years. ?How is this diagnosed? ?This condition is diagnosed based on your symptoms and family history. Your health care provider will also check your scalp skin, teeth, and nails. Your health care provider may refer you to a specialist in hair and skin disorders (dermatologist). ?You may also have tests, including: ?A hair pull test. ?Blood tests or other screening tests to check for autoimmune  diseases, such as thyroid disease or diabetes. ?Skin biopsy to confirm the diagnosis. ?A procedure to examine the skin with a lighted magnifying instrument (dermoscopy). ?How is this treated? ?There is no cure for alopecia areata. The goals of treatment are to promote the regrowth of hair and prevent the immune system from overreacting. No single treatment is right for all people with alopecia areata. It depends on the type of hair loss you have and how severe it is. ?Work with your health care provider to find the best treatment for you. Treatment may include: ?Regular checkups to make sure the condition is not getting worse . This is called watchful waiting. ?Using steroid creams or pills for 6-8 weeks to stop the immune reaction and help hair to regrow more quickly. ?Using other medicines on your skin (topical medicines) to change the immune system response and support the hair growth cycle. ?Steroid injections. ?Therapy and counseling with a support group or therapist if you are having trouble coping with hair loss. ?Follow these instructions at home: ?Medicines ?Apply topical creams only as told by your health care provider. ?Take over-the-counter and prescription medicines only as told by your health care provider. ?General instructions ?Learn as much as you can about your condition. ?Consider getting a wig or products to make hair look fuller or to cover bald spots, if you feel uncomfortable with your appearance. ?Get therapy or counseling if you are having a hard time coping with hair loss. Ask your health care provider to recommend a counselor or support group. ?Keep all   follow-up visits as told by your health care provider. This is important. ?Where to find more information ?National Alopecia Areata Foundation: naaf.org ?Contact a health care provider if: ?Your hair loss gets worse, even with treatment. ?You have new symptoms. ?You are struggling emotionally. ?Get help right away if: ?You have a sudden  worsening of the hair loss. ?Summary ?Alopecia areata is an autoimmune condition that makes your body's defense system (immune system) attack the hair follicles. This causes you to lose hair. ?Having this condition can be emotionally difficult, but it is not dangerous. ?Treatments may include regular checkups to make sure that the condition is not getting worse, medicines, and steroid injections. ?This information is not intended to replace advice given to you by your health care provider. Make sure you discuss any questions you have with your health care provider. ?Document Revised: 03/03/2019 Document Reviewed: 03/03/2019 ?Elsevier Patient Education ? 2022 Elsevier Inc. ? ?

## 2020-10-25 ENCOUNTER — Other Ambulatory Visit: Payer: Self-pay | Admitting: Family

## 2020-10-25 DIAGNOSIS — E1169 Type 2 diabetes mellitus with other specified complication: Secondary | ICD-10-CM

## 2020-11-08 ENCOUNTER — Ambulatory Visit (INDEPENDENT_AMBULATORY_CARE_PROVIDER_SITE_OTHER): Payer: Medicare Other

## 2020-11-08 VITALS — Ht 59.0 in | Wt 231.0 lb

## 2020-11-08 DIAGNOSIS — Z0000 Encounter for general adult medical examination without abnormal findings: Secondary | ICD-10-CM

## 2020-11-08 DIAGNOSIS — L638 Other alopecia areata: Secondary | ICD-10-CM | POA: Diagnosis not present

## 2020-11-08 DIAGNOSIS — L218 Other seborrheic dermatitis: Secondary | ICD-10-CM | POA: Diagnosis not present

## 2020-11-08 NOTE — Progress Notes (Signed)
Subjective:   Julie Carter is a 35 y.o. female who presents for Medicare Annual (Subsequent) preventive examination.  Virtual Visit via Telephone Note  I connected with  Julie Carter on 11/08/20 at  2:45 PM EST by telephone and verified that I am speaking with the correct person using two identifiers.  Location: Patient: Home Provider: [facility] Persons participating in the virtual visit: patient/mother, [place]   I discussed the limitations, risks, security and privacy concerns of performing an evaluation and management service by telephone and the availability of in person appointments. The patient expressed understanding and agreed to proceed.  Interactive audio and video telecommunications were attempted between this nurse and patient, however failed, due to patient having technical difficulties OR patient did not have access to video capability.  We continued and completed visit with audio only.  Some vital signs may be absent or patient reported.   Briceida Rasberry E Hildreth Robart, LPN   Review of Systems     Cardiac Risk Factors include: diabetes mellitus;sedentary lifestyle;obesity (BMI >30kg/m2);dyslipidemia     Objective:    Today's Vitals   11/08/20 1448  Weight: 231 lb (104.8 kg)  Height: 4' 11"  (1.499 m)   Body mass index is 46.66 kg/m.  Advanced Directives 11/08/2020 06/17/2018 01/14/2015 01/14/2015  Does Patient Have a Medical Advance Directive? Unable to assess, patient is non-responsive or altered mental status No No No  Would patient like information on creating a medical advance directive? - No - Patient declined No - patient declined information No - patient declined information    Current Medications (verified) Outpatient Encounter Medications as of 11/08/2020  Medication Sig   ACCU-CHEK GUIDE test strip TEST UP TO 4 TIMES DAILY AS DIRECTED   Accu-Chek Softclix Lancets lancets Test BS up to 4 times daily dx E11.9   Blood Glucose Monitoring Suppl  (ACCU-CHEK GUIDE ME) w/Device KIT USE UP TO FOUR TIMES DAILY AS DIRECTED.DX E11.9   DENTA 5000 PLUS 1.1 % CREA dental cream Take by mouth as directed.   Empagliflozin-metFORMIN HCl ER (SYNJARDY XR) 12.05-998 MG TB24 Take 2 tablets by mouth daily.   levothyroxine (SYNTHROID) 137 MCG tablet Take 1 tablet (137 mcg total) by mouth daily before breakfast.   triamcinolone ointment (KENALOG) 0.5 % Apply 1 application topically 2 (two) times daily.   Vitamin D, Ergocalciferol, (DRISDOL) 1.25 MG (50000 UNIT) CAPS capsule TAKE 1 CAPSULE BY MOUTH ONE TIME PER WEEK   No facility-administered encounter medications on file as of 11/08/2020.    Allergies (verified) Patient has no known allergies.   History: Past Medical History:  Diagnosis Date   Cancer ([place])    Congenital nystagmus    Diabetes mellitus    Down's syndrome    Heart murmur    Leukemia ([place])    Past Surgical History:  Procedure Laterality Date   HERNIA REPAIR     umbilical   Family History  Problem Relation Age of Onset   Diabetes Mother    Hypertension Mother    Hypertension Father    Social History   Socioeconomic History   Marital status: Single    Spouse name: Not on file   Number of children: 0   Years of education: 10   Highest education level: 10th grade  Occupational History   Occupation: Disabled  Tobacco Use   Smoking status: Never   Smokeless tobacco: Never  Vaping Use   Vaping Use: Never used  Substance and Sexual Activity   Alcohol use: No  Alcohol/week: 0.0 standard drinks   Drug use: No   Sexual activity: Never  Other Topics Concern   Not on file  Social History Narrative   Pt has Downs Syndrome and lives at home with her parents who take care of everything for her.   Social Determinants of Health   Financial Resource Strain: Low Risk    Difficulty of Paying Living Expenses: Not hard at all  Food Insecurity: No Food Insecurity   Worried About Charity fundraiser in the Last Year: Never  true   [place] in the Last Year: Never true  Transportation Needs: No Transportation Needs   Lack of Transportation (Medical): No   Lack of Transportation (Non-Medical): No  Physical Activity: Inactive   Days of Exercise per Week: 0 days   Minutes of Exercise per Session: 0 min  Stress: No Stress Concern Present   Feeling of Stress : Only a little  Social Connections: Socially Isolated   Frequency of Communication with Friends and Family: More than three times a week   Frequency of Social Gatherings with Friends and Family: More than three times a week   Attends Religious Services: Never   Marine scientist or Organizations: No   Attends Music therapist: Never   Marital Status: Never married    Tobacco Counseling Counseling given: Not Answered   Clinical Intake:  Pre-visit preparation completed: Yes  Pain : No/denies pain     BMI - recorded: 46.66 Nutritional Status: BMI > 30  Obese Nutritional Risks: None Diabetes: Yes CBG done?: No Did pt. bring in CBG monitor from home?: No  How often do you need to have someone help you when you read instructions, pamphlets, or other written materials from your doctor or pharmacy?: 5 - Always  Diabetic? Yes Nutrition Risk Assessment:  Has the patient had any N/V/D within the last 2 months?  No  Does the patient have any non-healing wounds?  No  Has the patient had any unintentional weight loss or weight gain?  No   Diabetes:  Is the patient diabetic?  Yes  If diabetic, was a CBG obtained today?  No  Did the patient bring in their glucometer from home?  No  How often do you monitor your CBG's? Twice per week.   Financial Strains and Diabetes Management:  Are you having any financial strains with the device, your supplies or your medication? No .  Does the patient want to be seen by Chronic Care Management for management of their diabetes?  No  Would the patient like to be referred to a  Nutritionist or for Diabetic Management?  No   Diabetic Exams:  Diabetic Eye Exam: Completed 2017. Overdue for diabetic eye exam. Pt has been advised about the importance in completing this exam. Reminded her mother to make appt soon  Diabetic Foot Exam: Completed 04/28/2020. Pt has been advised about the importance in completing this exam. Pt is scheduled for diabetic foot exam on next year.    Interpreter Needed?: No  Comments: mother, [name] speaks for her - patient has Down Syndrome and is very dependent Information entered by :: Janett Kamath, LPN   Activities of Daily Living In your present state of health, do you have any difficulty performing the following activities: 11/08/2020  Hearing? N  Vision? N  Difficulty concentrating or making decisions? Y  Walking or climbing stairs? N  Dressing or bathing? Y  Comment she can dress herself -  her mother bathes her  Doing errands, shopping? Y  Preparing Food and eating ? Y  Comment eats well on her own - mother prepares her meals  Using the Toilet? N  In the past six months, have you accidently leaked urine? N  Do you have problems with loss of bowel control? N  Managing your Medications? Y  Managing your Finances? Y  Housekeeping or managing your Housekeeping? Y  Some recent data might be hidden    Patient Care Team: [name], FNP as PCP - General (Nurse Practitioner) [name], LCSW as [place] Management (Licensed Clinical Social Worker) Ilean China, RN as Case Manager  Indicate any recent [place] you may have received from other than [facility] providers in the past year (date may be approximate).     Assessment:   This is a routine wellness examination for Julie Carter.  Hearing/Vision screen Hearing Screening - Comments:: Denies hearing difficulties  Vision Screening - Comments:: Supposed to wear rx glasses - behind on annual eye exams with [facility]  Dietary issues and  exercise activities discussed: Current Exercise Habits: The patient does not participate in regular exercise at present, Exercise limited by: None identified   Goals Addressed             This Visit's Progress    DIET - REDUCE SUGAR INTAKE   Not on track    Monitor and Manage My Blood Sugar-Diabetes Type 2   Not on track    Timeframe:  Long-Range Goal Priority:  Medium Start Date:  02/08/20                           Expected End Date:  08/07/20                    Follow-up Date: 06/2020   check blood sugar at prescribed times check blood sugar if I feel it is too high or too low enter blood sugar readings and medication or insulin into daily log take the blood sugar log to all doctor visits  Call PCP with any readings outside of recommended range Make healthy food choices and limit carbohydrates Take medication as prescribed Schedule follow-up with PCP for May 2022   Why is this important?   Checking your blood sugar at home helps to keep it from getting very high or very low.  Writing the results in a diary or log helps the doctor know how to care for you.  Your blood sugar log should have the time, date and the results.  Also, write down the amount of insulin or other medicine that you take.  Other information, like what you ate, exercise done and how you were feeling, will also be helpful.     Notes:        Depression Screen PHQ 2/9 Scores 11/08/2020 04/28/2020 04/21/2020 09/01/2019 12/04/2018 07/29/2018 06/17/2018  PHQ - 2 Score 0 0 2 0 0 0 0  PHQ- 9 Score - - 5 - - - -  Exception Documentation - - - - - - -  Not completed - - - - - - -    Fall Risk Fall Risk  11/08/2020 04/28/2020 04/08/2019 03/22/2015 02/02/2014  Falls in the past year? 0 0 0 No No  Number falls in past yr: 0 0 - - -  Injury with Fall? 0 0 - - -  Risk for fall due to :  No Fall Risks - - - -  Follow up Falls prevention discussed - - - -    FALL RISK PREVENTION PERTAINING TO THE HOME:  Any stairs in or  around the home? No  If so, are there any without handrails? No  Home free of loose throw rugs in walkways, pet beds, electrical cords, etc? Yes  Adequate lighting in your home to reduce risk of falls? Yes   ASSISTIVE DEVICES UTILIZED TO PREVENT FALLS:  Life alert? No  Use of a cane, walker or w/c? No  Grab bars in the bathroom? Yes  Shower chair or bench in shower? Yes  Elevated toilet seat or a handicapped toilet? Yes   TIMED UP AND GO:  Was the test performed? No . Telephonic visit.  Cognitive Function: MMSE - Mini Mental State Exam 11/08/2020 06/17/2018  Not completed: Unable to complete Unable to complete        Immunizations Immunization History  Administered Date(s) Administered   DTaP 04/11/1984, 09/18/1990   Influenza Split 08/14/2011   Influenza,inj,Quad PF,6+ Mos 11/02/2014, 09/22/2015, 09/23/2018   Pneumococcal Conjugate-13 03/22/2015   Pneumococcal Polysaccharide-23 01/02/2007   Tdap 04/08/2019    TDAP status: Up to date  Flu Vaccine status: Due, Education has been provided regarding the importance of this vaccine. Advised may receive this vaccine at local pharmacy or Health Dept. Aware to provide a copy of the vaccination record if obtained from local pharmacy or Health Dept. Verbalized acceptance and understanding.  Pneumococcal vaccine status: Up to date  Covid-19 vaccine status: Declined, Education has been provided regarding the importance of this vaccine but patient still declined. Advised may receive this vaccine at local pharmacy or Health Dept.or vaccine clinic. Aware to provide a copy of the vaccination record if obtained from local pharmacy or Health Dept. Verbalized acceptance and understanding.  Qualifies for Shingles Vaccine? No   Zostavax completed No    Screening Tests Health Maintenance  Topic Date Due   OPHTHALMOLOGY EXAM  04/11/2016   PAP SMEAR-Modifier  03/22/2018   COVID-19 Vaccine (1) 11/09/2020 (Originally [birth year]1984)   INFLUENZA  VACCINE  03/31/2021 (Originally 08/01/2020)   Pneumococcal Vaccine 67-48 Years old (3 - PPSV23 if available, else PCV20) 10/24/2021 (Originally 03/21/2016)   HEMOGLOBIN A1C  04/18/2021   FOOT EXAM  04/28/2021   URINE MICROALBUMIN  04/28/2021   TETANUS/TDAP  04/07/2029   Hepatitis C Screening  Completed   HIV Screening  Completed   HPV VACCINES  Aged Out    Health Maintenance  Health Maintenance Due  Topic Date Due   OPHTHALMOLOGY EXAM  04/11/2016   PAP SMEAR-Modifier  03/22/2018    Colorectal Cancer Screening due at age 50  Mammogram due at age 69  Bone Density Scan due at age 21  Lung Cancer Screening: (Low Dose CT Chest recommended if Age 35-80 years, 30 pack-year currently smoking OR have quit w/in 15years.) does not qualify  Additional Screening:  Hepatitis C Screening: does not qualify  Vision Screening: Recommended annual ophthalmology exams for early detection of glaucoma and other disorders of the eye. Is the patient up to date with their annual eye exam?  No  Who is the provider or what is the name of the office in which the patient attends annual eye exams? [facility] If pt is not established with a provider, would they like to be referred to a provider to establish care? No .   Dental Screening: Recommended annual dental exams for proper oral hygiene  [employer]  Referral / Chronic Care Management: CRR required this visit?  No   CCM required this visit?  No      Plan:     I have personally reviewed and noted the following in the patient's chart:   Medical and social history Use of alcohol, tobacco or illicit drugs  Current medications and supplements including opioid prescriptions.  Functional ability and status Nutritional status Physical activity Advanced directives List of other physicians Hospitalizations, surgeries, and ER visits in previous 12 months Vitals Screenings to include cognitive, depression, and falls Referrals and  appointments  In addition, I have reviewed and discussed with patient certain preventive protocols, quality metrics, and best practice recommendations. A written personalized care plan for preventive services as well as general preventive health recommendations were provided to patient.     Sandrea Hammond, LPN   [id number]   Nurse Notes: none

## 2020-11-08 NOTE — Patient Instructions (Signed)
Ms. Julie Carter , Thank you for taking time to come for your Medicare Wellness Visit. I appreciate your ongoing commitment to your health goals. Please review the following plan we discussed and let me know if I can assist you in the future.   Screening recommendations/referrals: Colonoscopy: due at age 35 Mammogram: due at age 36 Bone Density: due at age 54 Recommended yearly ophthalmology/optometry visit for glaucoma screening and checkup Recommended yearly dental visit for hygiene and checkup  Vaccinations: Influenza vaccine: Due. Every fall Pneumococcal vaccine: Done 2009 & 2017 Tdap vaccine: Done 04/08/2019 - Repeat in 10 years Shingles vaccine: Due age age 75  Covid-19: Declined  Advanced directives: Please bring a copy of your health care power of attorney and living will to the office to be added to your chart at your convenience.   Conditions/risks identified: Aim for 30 minutes of exercise or brisk walking each day, drink 6-8 glasses of water and eat lots of fruits and vegetables.   Next appointment: Follow up in one year for your annual wellness visit.   Preventive Care 65-40 Years Old, Female Preventive care refers to lifestyle choices and visits with your health care provider that can promote health and wellness. Preventive care visits are also called wellness exams. What can I expect for my preventive care visit? Counseling During your preventive care visit, your health care provider may ask about your: Medical history, including: Past medical problems. Family medical history. Pregnancy history. Current health, including: Menstrual cycle. Method of birth control. Emotional well-being. Home life and relationship well-being. Sexual activity and sexual health. Lifestyle, including: Alcohol, nicotine or tobacco, and drug use. Access to firearms. Diet, exercise, and sleep habits. Work and work Statistician. Sunscreen use. Safety issues such as seatbelt and bike helmet  use. Physical exam Your health care provider may check your: Height and weight. These may be used to calculate your BMI (body mass index). BMI is a measurement that tells if you are at a healthy weight. Waist circumference. This measures the distance around your waistline. This measurement also tells if you are at a healthy weight and may help predict your risk of certain diseases, such as type 2 diabetes and high blood pressure. Heart rate and blood pressure. Body temperature. Skin for abnormal spots. What immunizations do I need? Vaccines are usually given at various ages, according to a schedule. Your health care provider will recommend vaccines for you based on your age, medical history, and lifestyle or other factors, such as travel or where you work. What tests do I need? Screening Your health care provider may recommend screening tests for certain conditions. This may include: Pelvic exam and Pap test. Lipid and cholesterol levels. Diabetes screening. This is done by checking your blood sugar (glucose) after you have not eaten for a while (fasting). Hepatitis B test. Hepatitis C test. HIV (human immunodeficiency virus) test. STI (sexually transmitted infection) testing, if you are at risk. BRCA-related cancer screening. This may be done if you have a family history of breast, ovarian, tubal, or peritoneal cancers. Talk with your health care provider about your test results, treatment options, and if necessary, the need for more tests. Follow these instructions at home: Eating and drinking  Eat a healthy diet that includes fresh fruits and vegetables, whole grains, lean protein, and low-fat dairy products. Take vitamin and mineral supplements as recommended by your health care provider. Do not drink alcohol if: Your health care provider tells you not to drink. You are pregnant, may be  pregnant, or are planning to become pregnant. If you drink alcohol: Limit how much you have to  0-1 drink a day. Know how much alcohol is in your drink. In the [place] one drink equals one 12 oz bottle of beer (355 mL), one 5 oz glass of wine (148 mL), or one 1 oz glass of hard liquor (44 mL). Lifestyle Brush your teeth every morning and night with fluoride toothpaste. Floss one time each day. Exercise for at least 30 minutes 5 or more days each week. Do not use any products that contain nicotine or tobacco. These products include cigarettes, chewing tobacco, and vaping devices, such as e-cigarettes. If you need help quitting, ask your health care provider. Do not use drugs. If you are sexually active, practice safe sex. Use a condom or other form of protection to prevent STIs. If you do not wish to become pregnant, use a form of birth control. If you plan to become pregnant, see your health care provider for a prepregnancy visit. Find healthy ways to manage stress, such as: Meditation, yoga, or listening to music. Journaling. Talking to a trusted person. Spending time with friends and family. Minimize exposure to UV radiation to reduce your risk of skin cancer. Safety Always wear your seat belt while driving or riding in a vehicle. Do not drive: If you have been drinking alcohol. Do not ride with someone who has been drinking. If you have been using any mind-altering substances or drugs. While texting. When you are tired or distracted. Wear a helmet and other protective equipment during sports activities. If you have firearms in your house, make sure you follow all gun safety procedures. Seek help if you have been physically or sexually abused. What's next? Go to your health care provider once a year for an annual wellness visit. Ask your health care provider how often you should have your eyes and teeth checked. Stay up to date on all vaccines. This information is not intended to replace advice given to you by your health care provider. Make sure you discuss any questions you have  with your health care provider. Document Revised: 06/15/2020 Document Reviewed: 06/15/2020 Elsevier Patient Education  [place].

## 2020-11-14 ENCOUNTER — Telehealth: Payer: Medicare Other

## 2020-11-23 DIAGNOSIS — G4733 Obstructive sleep apnea (adult) (pediatric): Secondary | ICD-10-CM | POA: Diagnosis not present

## 2020-12-05 ENCOUNTER — Telehealth: Payer: Medicare Other

## 2020-12-05 DIAGNOSIS — G4733 Obstructive sleep apnea (adult) (pediatric): Secondary | ICD-10-CM | POA: Diagnosis not present

## 2020-12-14 ENCOUNTER — Telehealth: Payer: Medicare Other

## 2021-01-05 DIAGNOSIS — G4733 Obstructive sleep apnea (adult) (pediatric): Secondary | ICD-10-CM | POA: Diagnosis not present

## 2021-01-24 ENCOUNTER — Other Ambulatory Visit: Payer: Self-pay | Admitting: Family

## 2021-01-24 DIAGNOSIS — E559 Vitamin D deficiency, unspecified: Secondary | ICD-10-CM

## 2021-02-05 DIAGNOSIS — G4733 Obstructive sleep apnea (adult) (pediatric): Secondary | ICD-10-CM | POA: Diagnosis not present

## 2021-02-09 ENCOUNTER — Ambulatory Visit (INDEPENDENT_AMBULATORY_CARE_PROVIDER_SITE_OTHER): Payer: Medicare Other

## 2021-02-09 DIAGNOSIS — Q909 Down syndrome, unspecified: Secondary | ICD-10-CM

## 2021-02-09 DIAGNOSIS — E890 Postprocedural hypothyroidism: Secondary | ICD-10-CM

## 2021-02-09 DIAGNOSIS — E559 Vitamin D deficiency, unspecified: Secondary | ICD-10-CM

## 2021-02-09 DIAGNOSIS — E1169 Type 2 diabetes mellitus with other specified complication: Secondary | ICD-10-CM

## 2021-02-09 NOTE — Chronic Care Management (AMB) (Signed)
Chronic Care Management    Clinical Social Work Note  02/09/2021 Name: Julie Carter MRN: [id number] DOB: Aug 28, 1985  Julie Carter is a 36 y.o. year old female who is a primary care patient of Julie Balloon, FNP. The CCM team was consulted to assist the patient with chronic disease management and/or care coordination needs related to: [name]   Engaged with patient/ mother of patient, Julie Carter,  by telephone for follow up visit in response to provider referral for social work chronic care management and care coordination services.   Consent to Services:  The patient was given information about Chronic Care Management services, agreed to services, and gave verbal consent prior to initiation of services.  Please see initial visit note for detailed documentation.   Patient agreed to services and consent obtained.   Assessment: Review of patient past medical history, allergies, medications, and health status, including review of relevant consultants reports was performed today as part of a comprehensive evaluation and provision of chronic care management and care coordination services.     SDOH (Social Determinants of Health) assessments and interventions performed:  SDOH Interventions    Flowsheet Row Most Recent Value  SDOH Interventions   Physical Activity Interventions Other (Comments)  [occasional walking challenges]  Stress Interventions Other (Comment)  [client has stress related to completion of some ADLs. She needs help with bathing]        Advanced Directives Status: See Vyn[employer] application for related entries.  CCM Care Plan  No Known Allergies  Outpatient Encounter Medications as of 02/09/2021  Medication Sig   Vitamin D, Ergocalciferol, (DRISDOL) 1.25 MG (50000 UNIT) CAPS capsule TAKE 1 CAPSULE BY MOUTH ONE TIME PER WEEK   ACCU-CHEK GUIDE test strip TEST UP TO 4 TIMES DAILY AS DIRECTED   Accu-Chek Softclix Lancets lancets Test BS up to 4 times daily  dx E11.9   Blood Glucose Monitoring Suppl (ACCU-CHEK GUIDE ME) w/Device KIT USE UP TO FOUR TIMES DAILY AS DIRECTED.DX E11.9   DENTA 5000 PLUS 1.1 % CREA dental cream Take by mouth as directed.   Empagliflozin-metFORMIN HCl ER (SYNJARDY XR) 12.05-998 MG TB24 Take 2 tablets by mouth daily.   levothyroxine (SYNTHROID) 137 MCG tablet Take 1 tablet (137 mcg total) by mouth daily before breakfast.   triamcinolone ointment (KENALOG) 0.5 % Apply 1 application topically 2 (two) times daily.   No facility-administered encounter medications on file as of 02/09/2021.    Patient Active Problem List   Diagnosis Date Noted   Hypercoagulable state associated with COVID-19 ([name]) 01/30/2020   Hypernatremia 01/30/2020   Other specified hypothyroidism 01/30/2020   Pneumonia due to COVID-19 virus 01/30/2020   Type 2 diabetes mellitus without complication, without long-term current use of insulin ([place]) 01/30/2020   Morbid obesity with BMI of 50.0-59.9, adult ([place]) 03/22/2015   Hypochloremia    Vitamin D deficiency 02/03/2014   Down syndrome 02/02/2014   Hypothyroidism, postradioiodine therapy 10/03/2012   Diabetes mellitus ([place]) 10/03/2012    Conditions to be addressed/monitored: monitor client completion of ADLs, as she is able  Care Plan : LCSW care plan  Updates made by Julie Cabal, LCSW since 02/09/2021 12:00 AM     Problem: Coping Skills (General Plan of Care)      Goal: Coping Skills Enhanced;Complete ADLs as able   Start Date: 02/09/2021  Expected End Date: 05/08/2021  This Visit's Progress: On track  Recent Progress: On track  Priority: Medium  Note:   Current barriers:  Patient in need of assistance with connecting to community resources for possible help in completing daily ADLs and daily activities of client Patient is unable to independently navigate community resource options without care coordination support Down's Syndrome  Clinical Goals:  patient will work with SW in next 30  days to address concerns related to ADLs completion of client Patient will attend all scheduled medical appointments in next 30 days In next 30 days, patient will communicate ongoing needs to her mother, Julie Carter  Clinical Interventions:  Collaboration with Julie Balloon, FNP regarding development and update of comprehensive plan of care as evidenced by provider attestation and co-signature Assessment of needs, barriers of client Discussed ambulation of client with Julie Carter, mother of client Reviewed sleeping issues of client. Reviewed appetite of client. Discussed relaxation techniques of client  (watches TV, plays computer games, watches videos, listens to music on her computer) Discussed vision needs of client. Reviewed socialization of client. Julie Carter said client communicates her needs. Julie Carter said that client enjoys socializing with family members when they come to visit Julie Carter Discussed with Julie Carter CCM program support for Julie Carter  Patient Coping Skills: Has support from her mother Eats meals with set up assistance Completes ADLs as able  Patient Deficits: Down's syndrome Decrease energy  Patient Goals:  Client will attend scheduled medical appointments in next 30 days Client will communicate regularly with her mother in next 30 days to discuss needs of client Client will complete ADLs as able in next 30 days -  Follow Up Plan: LCSW to call client or her mother on 04/05/21 at 10:00 AM to assess client needs.       Julie Carter.Julie Carter MSW, [place] [facility] representative [facility] Care Management [phone]

## 2021-02-09 NOTE — Patient Instructions (Addendum)
Visit Information  Patient Goals:  Protect My Health (Patient).  Complete ADLs daily as able  Timeframe:  Short-Term Goal Priority:  Medium Progress: On Track Start Date:             02/09/21                Expected End Date:           05/08/21            Follow Up Date   04/05/21 at 10:00 AM    Protect My Health (Patient)   Complete ADLs daily as able    Why is this important?   Screening tests can find diseases early when they are easier to treat.  Your doctor or nurse will talk with you about which tests are important for you.  Getting shots for common diseases like the flu and shingles will help prevent them.     Patient Coping Skills: Has support from her mother Eats meals with set up assistance Completes ADLs as able  Patient Deficits: Down's syndrome Decrease energy  Patient Goals:  Client will attend scheduled medical appointments in next 30 days Client will communicate regularly with her mother in next 30 days to discuss needs of client Client will complete ADLs as able in next 30 days -  Follow Up Plan: LCSW to call client or her mother on 04/05/21 at 10:00 AM to assess client needs.  [name].Aneli Zara MSW, [place] [facility] representative [facility] Care Management [phone]

## 2021-02-21 DIAGNOSIS — G4733 Obstructive sleep apnea (adult) (pediatric): Secondary | ICD-10-CM | POA: Diagnosis not present

## 2021-03-05 DIAGNOSIS — G4733 Obstructive sleep apnea (adult) (pediatric): Secondary | ICD-10-CM | POA: Diagnosis not present

## 2021-04-04 ENCOUNTER — Other Ambulatory Visit: Payer: Self-pay | Admitting: Family

## 2021-04-04 DIAGNOSIS — E559 Vitamin D deficiency, unspecified: Secondary | ICD-10-CM

## 2021-04-05 ENCOUNTER — Telehealth: Payer: Medicare Other

## 2021-04-05 DIAGNOSIS — G4733 Obstructive sleep apnea (adult) (pediatric): Secondary | ICD-10-CM | POA: Diagnosis not present

## 2021-04-24 ENCOUNTER — Encounter: Payer: Self-pay | Admitting: Family

## 2021-04-24 ENCOUNTER — Ambulatory Visit (INDEPENDENT_AMBULATORY_CARE_PROVIDER_SITE_OTHER): Payer: Medicare Other | Admitting: Family

## 2021-04-24 VITALS — BP 103/66 | HR 80 | Temp 98.6°F | Ht 59.0 in | Wt 243.4 lb

## 2021-04-24 DIAGNOSIS — E890 Postprocedural hypothyroidism: Secondary | ICD-10-CM

## 2021-04-24 DIAGNOSIS — E559 Vitamin D deficiency, unspecified: Secondary | ICD-10-CM | POA: Diagnosis not present

## 2021-04-24 DIAGNOSIS — E1169 Type 2 diabetes mellitus with other specified complication: Secondary | ICD-10-CM

## 2021-04-24 DIAGNOSIS — Q909 Down syndrome, unspecified: Secondary | ICD-10-CM

## 2021-04-24 DIAGNOSIS — Z6841 Body Mass Index (BMI) 40.0 and over, adult: Secondary | ICD-10-CM

## 2021-04-24 LAB — BAYER DCA HB A1C WAIVED: HB A1C (BAYER DCA - WAIVED): 8 % — ABNORMAL HIGH (ref 4.8–5.6)

## 2021-04-24 NOTE — Patient Instructions (Signed)

## 2021-04-24 NOTE — Progress Notes (Signed)
? ?Subjective:  ? ? Patient ID: Julie Carter, female    DOB: Mar 14, 1985, 36 y.o.   MRN: [id number] ? ?Chief Complaint  ?Patient presents with  ? Medical Management of Chronic Issues  ? ?PT presents to the office today for chronic follow up. She presents with mother. She has down syndrome.  ?Diabetes ?She presents for her follow-up diabetic visit. She has type 2 diabetes mellitus. There are no hypoglycemic associated symptoms. Pertinent negatives for hypoglycemia include no nervousness/anxiousness. Associated symptoms include blurred vision and fatigue. Pertinent negatives for diabetes include no foot paresthesias. Risk factors for coronary artery disease include diabetes mellitus, dyslipidemia and sedentary lifestyle. She is following a generally unhealthy diet. (Does not check BS at home) Eye exam is not current.  ?Thyroid Problem ?Presents for follow-up visit. Symptoms include dry skin and fatigue. Patient reports no anxiety or hoarse voice. The symptoms have been stable.  ? ? ? ?Review of Systems  ?Constitutional:  Positive for fatigue.  ?HENT:  Negative for hoarse voice.   ?Eyes:  Positive for blurred vision.  ?Psychiatric/Behavioral:  The patient is not nervous/anxious.   ?All other systems reviewed and are negative. ? ?   ?Objective:  ? Physical Exam ?Vitals reviewed.  ?Constitutional:   ?   General: She is not in acute distress. ?   Appearance: She is well-developed. She is obese.  ?HENT:  ?   Head: Atraumatic. Microcephalic.  ?   Right Ear: Tympanic membrane normal.  ?   Left Ear: Tympanic membrane normal.  ?Eyes:  ?   Pupils: Pupils are equal, round, and reactive to light.  ?Neck:  ?   Thyroid: No thyromegaly.  ?Cardiovascular:  ?   Rate and Rhythm: Normal rate and regular rhythm.  ?   Heart sounds: Normal heart sounds. No murmur heard. ?Pulmonary:  ?   Effort: Pulmonary effort is normal. No respiratory distress.  ?   Breath sounds: Normal breath sounds. No wheezing.  ?Abdominal:  ?   General: Bowel  sounds are normal. There is no distension.  ?   Palpations: Abdomen is soft.  ?   Tenderness: There is no abdominal tenderness.  ?Musculoskeletal:     ?   General: No tenderness. Normal range of motion.  ?   Cervical back: Normal range of motion and neck supple.  ?Skin: ?   General: Skin is warm and dry.  ?Neurological:  ?   Mental Status: She is alert and oriented to person, place, and time.  ?   Cranial Nerves: No cranial nerve deficit.  ?   Deep Tendon Reflexes: Reflexes are normal and symmetric.  ?Psychiatric:     ?   Behavior: Behavior normal.     ?   Thought Content: Thought content normal.     ?   Judgment: Judgment normal.  ? ? ? ?BP 103/66   Pulse 80   Temp 98.6 ?F (37 ?C) (Temporal)   Ht 4' 11" (1.499 m)   Wt 243 lb 6.4 oz (110.4 kg)   BMI 49.16 kg/m?  ? ?   ?Assessment & Plan:  ?Julie Carter comes in today with chief complaint of Medical Management of Chronic Issues ? ? ?Diagnosis and orders addressed: ? ?1. Type 2 diabetes mellitus with other specified complication, without long-term current use of insulin ([place]) ?- Bayer DCA Hb A1c Waived ?- CMP14+EGFR ? ?2. Down syndrome ?- CMP14+EGFR ? ?3. Morbid obesity with BMI of 50.0-59.9, adult ([place]) ?- CMP14+EGFR ? ?4. Vitamin D  deficiency ?- CMP14+EGFR ? ?5. Hypothyroidism, postradioiodine therapy ?- CMP14+EGFR ?- TSH ? ? ?Labs pending ?Health Maintenance reviewed ?Diet and exercise encouraged ? ?Follow up plan: ?6 months  ? ? ?Christy Hawks, FNP ? ? ?

## 2021-04-25 ENCOUNTER — Other Ambulatory Visit: Payer: Self-pay | Admitting: Family

## 2021-04-25 DIAGNOSIS — E1169 Type 2 diabetes mellitus with other specified complication: Secondary | ICD-10-CM

## 2021-04-25 LAB — CMP14+EGFR
ALT: 32 IU/L (ref 0–32)
AST: 21 IU/L (ref 0–40)
Albumin/Globulin Ratio: 1.7 (ref 1.2–2.2)
Albumin: 4 g/dL (ref 3.8–4.8)
Alkaline Phosphatase: 91 IU/L (ref 44–121)
BUN/Creatinine Ratio: 17 (ref 9–23)
BUN: 13 mg/dL (ref 6–20)
Bilirubin Total: 0.3 mg/dL (ref 0.0–1.2)
CO2: 26 mmol/L (ref 20–29)
Calcium: 9.3 mg/dL (ref 8.7–10.2)
Chloride: 103 mmol/L (ref 96–106)
Creatinine, Ser: 0.78 mg/dL (ref 0.57–1.00)
Globulin, Total: 2.3 g/dL (ref 1.5–4.5)
Glucose: 293 mg/dL — ABNORMAL HIGH (ref 70–99)
Potassium: 4.4 mmol/L (ref 3.5–5.2)
Sodium: 143 mmol/L (ref 134–144)
Total Protein: 6.3 g/dL (ref 6.0–8.5)
eGFR: 100 mL/min/{1.73_m2} (ref >59)

## 2021-04-25 LAB — TSH: TSH: 2.04 u[IU]/mL (ref 0.450–4.500)

## 2021-04-25 MED ORDER — OZEMPIC (0.25 OR 0.5 MG/DOSE) 2 MG/1.5ML ~~LOC~~ SOPN: 0.5000 mg | PEN_INJECTOR | SUBCUTANEOUS | 1 refills | Status: DC

## 2021-04-25 MED ORDER — OZEMPIC (0.25 OR 0.5 MG/DOSE) 2 MG/1.5ML ~~LOC~~ SOPN: PEN_INJECTOR | SUBCUTANEOUS | 0 refills | Status: AC

## 2021-05-02 ENCOUNTER — Other Ambulatory Visit: Payer: Self-pay | Admitting: Family

## 2021-05-02 DIAGNOSIS — E1169 Type 2 diabetes mellitus with other specified complication: Secondary | ICD-10-CM

## 2021-05-05 DIAGNOSIS — G4733 Obstructive sleep apnea (adult) (pediatric): Secondary | ICD-10-CM | POA: Diagnosis not present

## 2021-06-05 DIAGNOSIS — G4733 Obstructive sleep apnea (adult) (pediatric): Secondary | ICD-10-CM | POA: Diagnosis not present

## 2021-07-03 ENCOUNTER — Encounter: Payer: Self-pay | Admitting: Family

## 2021-07-03 ENCOUNTER — Ambulatory Visit (INDEPENDENT_AMBULATORY_CARE_PROVIDER_SITE_OTHER): Payer: Medicare Other | Admitting: Family

## 2021-07-03 VITALS — BP 106/65 | HR 88 | Temp 96.6°F | Ht 59.0 in | Wt 241.8 lb

## 2021-07-03 DIAGNOSIS — E559 Vitamin D deficiency, unspecified: Secondary | ICD-10-CM | POA: Diagnosis not present

## 2021-07-03 DIAGNOSIS — Z6841 Body Mass Index (BMI) 40.0 and over, adult: Secondary | ICD-10-CM

## 2021-07-03 DIAGNOSIS — Q909 Down syndrome, unspecified: Secondary | ICD-10-CM

## 2021-07-03 DIAGNOSIS — E1169 Type 2 diabetes mellitus with other specified complication: Secondary | ICD-10-CM

## 2021-07-03 LAB — BAYER DCA HB A1C WAIVED: HB A1C (BAYER DCA - WAIVED): 6.6 % — ABNORMAL HIGH (ref 4.8–5.6)

## 2021-07-03 MED ORDER — VITAMIN D (ERGOCALCIFEROL) 1.25 MG (50000 UNIT) PO CAPS: ORAL_CAPSULE | ORAL | 1 refills | Status: DC

## 2021-07-03 MED ORDER — SYNJARDY XR 12.5-1000 MG PO TB24: 2.0000 | ORAL_TABLET | Freq: Every day | ORAL | 1 refills | Status: DC

## 2021-07-03 MED ORDER — SEMAGLUTIDE (1 MG/DOSE) 4 MG/3ML ~~LOC~~ SOPN: 1.0000 mg | PEN_INJECTOR | SUBCUTANEOUS | 2 refills | Status: DC

## 2021-07-03 NOTE — Patient Instructions (Signed)

## 2021-07-03 NOTE — Progress Notes (Signed)
Subjective:    Patient ID: Julie Carter, female    DOB: 1985/02/26, 36 y.o.   MRN: [id number]  Chief Complaint  Patient presents with   Diabetes    2 month follow up   Referral    Podiatry- to cut toe nails    Julie Carter presents to the office today to follow up on DM. Julie Carter last A1C was 8.0 and we started Julie Carter on Ozempic 0.25 mg then increased to 0.5 mg. Julie Carter has lost 2 lb since our last visit.  Diabetes Julie Carter presents for Julie Carter follow-up diabetic visit. Julie Carter has type 2 diabetes mellitus. Pertinent negatives for diabetes include no blurred vision and no foot paresthesias. Symptoms are stable. Risk factors for coronary artery disease include diabetes mellitus and sedentary lifestyle. (Does not check BS at home ) Eye exam is not current.      Review of Systems  Eyes:  Negative for blurred vision.  All other systems reviewed and are negative.      Objective:   Physical Exam Vitals reviewed.  Constitutional:      General: Julie Carter is not in acute distress.    Appearance: Julie Carter is well-developed. Julie Carter is obese.  HENT:     Head: Atraumatic. Microcephalic.     Right Ear: Tympanic membrane normal. There is no impacted cerumen.     Left Ear: Tympanic membrane normal.  Eyes:     Pupils: Pupils are equal, round, and reactive to light.  Neck:     Thyroid: No thyromegaly.  Cardiovascular:     Rate and Rhythm: Normal rate and regular rhythm.     Heart sounds: Normal heart sounds. No murmur heard. Pulmonary:     Effort: Pulmonary effort is normal. No respiratory distress.     Breath sounds: Normal breath sounds. No wheezing.  Abdominal:     General: Bowel sounds are normal. There is no distension.     Palpations: Abdomen is soft.     Tenderness: There is no abdominal tenderness.  Musculoskeletal:        General: No tenderness. Normal range of motion.     Cervical back: Normal range of motion and neck supple.  Skin:    General: Skin is warm and dry.  Neurological:     Mental Status: Julie Carter is alert  and oriented to person, place, and time.     Cranial Nerves: No cranial nerve deficit.     Deep Tendon Reflexes: Reflexes are normal and symmetric.  Psychiatric:        Behavior: Behavior normal.        Thought Content: Thought content normal.        Judgment: Judgment normal.    Diabetic Foot Exam - Simple   Simple Foot Form Visual Inspection See comments: Yes Sensation Testing Intact to touch and monofilament testing bilaterally: Yes Pulse Check Posterior Tibialis and Dorsalis pulse intact bilaterally: Yes Comments Toenail very long, feet dry        BP 106/65   Pulse 88   Temp (!) 96.6 F (35.9 C) (Temporal)   Ht 4' 11"  (1.499 m)   Wt 241 lb 12.8 oz (109.7 kg)   BMI 48.84 kg/m   Assessment & Plan:   SYLVA OVERLEY comes in today with chief complaint of Diabetes (2 month follow up) and Referral (Podiatry- to cut toe nails )   Diagnosis and orders addressed:  1. Type 2 diabetes mellitus with other specified complication, without long-term current use of insulin (H[facility]) Will increase  Ozempic to 1 mg from 5 mg  Discussed importance of exercise  Low carb diet - Empagliflozin-metFORMIN HCl ER (SYNJARDY XR) 12.05-998 MG TB24; Take 2 tablets by mouth daily.  Dispense: 180 tablet; Refill: 1 - Bayer DCA Hb A1c Waived - CMP14+EGFR - Microalbumin / creatinine urine ratio - Ambulatory referral to Podiatry - Semaglutide, 1 MG/DOSE, 4 MG/3ML SOPN; Inject 1 mg into the skin once a week.  Dispense: 3 mL; Refill: 2  2. Vitamin D deficiency - Vitamin D, Ergocalciferol, (DRISDOL) 1.25 MG (50000 UNIT) CAPS capsule; Take 1 capsule by mouth one timer per week  Dispense: 12 capsule; Refill: 1 - CMP14+EGFR  3. Morbid obesity with BMI of 50.0-59.9, adult ([place]) - CMP14+EGFR  4. Down syndrome - CMP14+EGFR   Labs pending Health Maintenance reviewed Diet and exercise encouraged  Follow up plan: 3 months    Evelina Dun, FNP

## 2021-07-04 LAB — CMP14+EGFR
ALT: 42 IU/L — ABNORMAL HIGH (ref 0–32)
AST: 28 IU/L (ref 0–40)
Albumin/Globulin Ratio: 1.7 (ref 1.2–2.2)
Albumin: 4.1 g/dL (ref 3.8–4.8)
Alkaline Phosphatase: 88 IU/L (ref 44–121)
BUN/Creatinine Ratio: 13 (ref 9–23)
BUN: 10 mg/dL (ref 6–20)
Bilirubin Total: 0.3 mg/dL (ref 0.0–1.2)
CO2: 25 mmol/L (ref 20–29)
Calcium: 9.2 mg/dL (ref 8.7–10.2)
Chloride: 104 mmol/L (ref 96–106)
Creatinine, Ser: 0.77 mg/dL (ref 0.57–1.00)
Globulin, Total: 2.4 g/dL (ref 1.5–4.5)
Glucose: 170 mg/dL — ABNORMAL HIGH (ref 70–99)
Potassium: 4.4 mmol/L (ref 3.5–5.2)
Sodium: 145 mmol/L — ABNORMAL HIGH (ref 134–144)
Total Protein: 6.5 g/dL (ref 6.0–8.5)
eGFR: 102 mL/min/{1.73_m2} (ref >59)

## 2021-07-05 LAB — MICROALBUMIN / CREATININE URINE RATIO
Creatinine, Urine: 42.2 mg/dL
Microalb/Creat Ratio: 8 mg/g creat (ref 0–29)
Microalbumin, Urine: 3.2 ug/mL

## 2021-07-24 ENCOUNTER — Ambulatory Visit (INDEPENDENT_AMBULATORY_CARE_PROVIDER_SITE_OTHER): Payer: Medicare Other | Admitting: Podiatry

## 2021-07-24 DIAGNOSIS — M79674 Pain in right toe(s): Secondary | ICD-10-CM | POA: Diagnosis not present

## 2021-07-24 DIAGNOSIS — B351 Tinea unguium: Secondary | ICD-10-CM | POA: Diagnosis not present

## 2021-07-24 DIAGNOSIS — M79675 Pain in left toe(s): Secondary | ICD-10-CM

## 2021-07-24 NOTE — Progress Notes (Signed)
   SUBJECTIVE Patient with a history of diabetes mellitus presents to office today complaining of elongated, thickened nails that cause pain while ambulating in shoes.  Patient is unable to trim their own nails. Patient is here for further evaluation and treatment.   Past Medical History:  Diagnosis Date   Cancer ([place])    Congenital nystagmus    Diabetes mellitus    Down's syndrome    Heart murmur    Leukemia ([place])     OBJECTIVE General Patient is awake, alert, and oriented x 3 and in no acute distress. Derm Skin is dry and supple bilateral. Negative open lesions or macerations. Remaining integument unremarkable. Nails are tender, long, thickened and dystrophic with subungual debris, consistent with onychomycosis, 1-5 bilateral. No signs of infection noted. Vasc  DP and PT pedal pulses palpable bilaterally. Temperature gradient within normal limits.  Neuro Epicritic and protective threshold sensation diminished bilaterally.  Musculoskeletal Exam No symptomatic pedal deformities noted bilateral. Muscular strength within normal limits.  ASSESSMENT 1. Diabetes Mellitus w/ peripheral neuropathy 2.  Pain due to onychomycosis of toenails bilateral  PLAN OF CARE 1. Patient evaluated today.  Comprehensive diabetic foot exam performed today 2. Instructed to maintain good pedal hygiene and foot care. Stressed importance of controlling blood sugar.  3. Mechanical debridement of nails 1-5 bilaterally performed using a nail nipper. Filed with dremel without incident.  4. Return to clinic in 3 mos.     Edrick Kins, DPM [facility]  Dr. Edrick Kins, DPM    [address], [place] [address]                Office [phone]  Fax [phone]

## 2021-07-26 DIAGNOSIS — G4733 Obstructive sleep apnea (adult) (pediatric): Secondary | ICD-10-CM | POA: Diagnosis not present

## 2021-08-10 ENCOUNTER — Ambulatory Visit: Payer: Self-pay | Admitting: *Deleted

## 2021-08-10 NOTE — Patient Instructions (Signed)
Julie Carter  At some point during the past 4 years, I have worked with you through the [place] Management Program (CCM) at [place]. We have not worked together within the past 6 months.   Due to program changes I am removing myself from your care team.   If you are currently active with another CCM Team Member, you will remain active with them unless they reach out to you with additional information.   If you feel that you need services in the future,  please talk with your primary care provider and request a new referral for Care Management or Care Coordination services. This does not affect your status as a patient at [facility].   Thank you for allowing me to participate in your your healthcare journey.  Chong Sicilian, BSN, RN-BC Proofreader Dial: [phone]

## 2021-08-10 NOTE — Chronic Care Management (AMB) (Signed)
  Chronic Care Management   Note  08/10/2021 Name: Julie Carter MRN: [id number] DOB: 07/09/1985   Due to changes in the Chronic Care Management program, I am removing myself as the [place] from the Care Team and closing any RN Care Management Care Plans. The patient has not worked with the Consulting civil engineer within the past 6 months. Patient was not scheduled to be followed by the RN Care Coordination nurse for [facility].   Patient does not have an open Care Plan with another CCM team member. Patient does not have a current CCM referral placed since 05/01/21. CCM enrollment status changed to "not enrolled".   Patient's PCP can place a new referral if the they needs Care Management or Care Coordination services in the future.  Chong Sicilian, BSN, RN-BC Proofreader Dial: [phone]

## 2021-09-07 NOTE — Progress Notes (Signed)
[facility] Quality Team Note  Name: Julie Carter Date of Birth: [birth year] MRN: [id number] Date: 09/07/2021  [facility] Quality Team has reviewed this patient's chart, please see recommendations below:  Diabetic Retinal Eye Exam; Patient requests [facility] Quality Coordinator to schedule Diabetic Retinal Screening at ([place] Event 09/14/2021 1:30P).

## 2021-09-26 ENCOUNTER — Other Ambulatory Visit: Payer: Self-pay | Admitting: Family

## 2021-09-26 DIAGNOSIS — E1169 Type 2 diabetes mellitus with other specified complication: Secondary | ICD-10-CM

## 2021-10-03 ENCOUNTER — Ambulatory Visit: Payer: Medicare Other | Admitting: Family

## 2021-10-23 ENCOUNTER — Ambulatory Visit: Payer: Medicare Other | Admitting: Podiatry

## 2021-10-24 ENCOUNTER — Ambulatory Visit: Payer: Medicare Other | Admitting: Family

## 2021-10-26 ENCOUNTER — Other Ambulatory Visit: Payer: Self-pay | Admitting: Family

## 2021-10-26 DIAGNOSIS — E1169 Type 2 diabetes mellitus with other specified complication: Secondary | ICD-10-CM

## 2021-10-26 NOTE — Telephone Encounter (Signed)
Name from pharmacy: SYNJARDY XR 12.5-1,000 MG TAB Pharmacy comment: Alternative Requested:MED NOT COVERED.

## 2021-10-31 ENCOUNTER — Ambulatory Visit (INDEPENDENT_AMBULATORY_CARE_PROVIDER_SITE_OTHER): Payer: Medicare Other | Admitting: Podiatry

## 2021-10-31 DIAGNOSIS — M79675 Pain in left toe(s): Secondary | ICD-10-CM | POA: Diagnosis not present

## 2021-10-31 DIAGNOSIS — M79674 Pain in right toe(s): Secondary | ICD-10-CM | POA: Diagnosis not present

## 2021-10-31 DIAGNOSIS — B351 Tinea unguium: Secondary | ICD-10-CM

## 2021-10-31 NOTE — Progress Notes (Signed)
   Chief Complaint  Patient presents with   diabetic foot care    SUBJECTIVE Patient with a history of diabetes mellitus presents to office today complaining of elongated, thickened nails that cause pain while ambulating in shoes.  Patient is unable to trim their own nails. Patient is here for further evaluation and treatment.   Past Medical History:  Diagnosis Date   Cancer ([place])    Congenital nystagmus    Diabetes mellitus    Down's syndrome    Heart murmur    Leukemia ([place])     OBJECTIVE General Patient is awake, alert, and oriented x 3 and in no acute distress. Derm Skin is dry and supple bilateral. Negative open lesions or macerations. Remaining integument unremarkable. Nails are tender, long, thickened and dystrophic with subungual debris, consistent with onychomycosis, 1-5 bilateral. No signs of infection noted. Vasc  DP and PT pedal pulses palpable bilaterally. Temperature gradient within normal limits.  Neuro Epicritic and protective threshold sensation diminished bilaterally.  Musculoskeletal Exam No symptomatic pedal deformities noted bilateral. Muscular strength within normal limits.  ASSESSMENT 1. Diabetes Mellitus w/ peripheral neuropathy 2.  Pain due to onychomycosis of toenails bilateral  PLAN OF CARE 1. Patient evaluated today.  Comprehensive diabetic foot exam performed today 2. Instructed to maintain good pedal hygiene and foot care. Stressed importance of controlling blood sugar.  3. Mechanical debridement of nails 1-5 bilaterally performed using a nail nipper. Filed with dremel without incident.  4. Return to clinic in 3 mos.     Edrick Kins, DPM [facility]  Dr. Edrick Kins, DPM    [address], [place] [address]                Office [phone]  Fax [phone]

## 2021-11-03 ENCOUNTER — Other Ambulatory Visit: Payer: Self-pay | Admitting: Family

## 2021-11-03 DIAGNOSIS — E1169 Type 2 diabetes mellitus with other specified complication: Secondary | ICD-10-CM

## 2021-11-07 ENCOUNTER — Ambulatory Visit (INDEPENDENT_AMBULATORY_CARE_PROVIDER_SITE_OTHER): Payer: Medicare Other | Admitting: Family

## 2021-11-07 ENCOUNTER — Telehealth: Payer: Self-pay | Admitting: Family

## 2021-11-07 ENCOUNTER — Encounter: Payer: Self-pay | Admitting: Family

## 2021-11-07 VITALS — BP 119/80 | HR 91 | Temp 98.0°F | Ht 59.0 in | Wt 229.0 lb

## 2021-11-07 DIAGNOSIS — E559 Vitamin D deficiency, unspecified: Secondary | ICD-10-CM | POA: Diagnosis not present

## 2021-11-07 DIAGNOSIS — E1169 Type 2 diabetes mellitus with other specified complication: Secondary | ICD-10-CM

## 2021-11-07 DIAGNOSIS — Z23 Encounter for immunization: Secondary | ICD-10-CM | POA: Diagnosis not present

## 2021-11-07 DIAGNOSIS — Q909 Down syndrome, unspecified: Secondary | ICD-10-CM | POA: Diagnosis not present

## 2021-11-07 DIAGNOSIS — E890 Postprocedural hypothyroidism: Secondary | ICD-10-CM

## 2021-11-07 DIAGNOSIS — Z6841 Body Mass Index (BMI) 40.0 and over, adult: Secondary | ICD-10-CM

## 2021-11-07 LAB — BAYER DCA HB A1C WAIVED: HB A1C (BAYER DCA - WAIVED): 6.2 % — ABNORMAL HIGH (ref 4.8–5.6)

## 2021-11-07 MED ORDER — LEVOTHYROXINE SODIUM 137 MCG PO TABS: 137.0000 ug | ORAL_TABLET | Freq: Every day | ORAL | 3 refills | Status: DC

## 2021-11-07 MED ORDER — SYNJARDY XR 12.5-1000 MG PO TB24: 2.0000 | ORAL_TABLET | Freq: Every day | ORAL | 1 refills | Status: DC

## 2021-11-07 MED ORDER — SEMAGLUTIDE (2 MG/DOSE) 8 MG/3ML ~~LOC~~ SOPN: 2.0000 mg | PEN_INJECTOR | SUBCUTANEOUS | 2 refills | Status: DC

## 2021-11-07 NOTE — Patient Instructions (Signed)

## 2021-11-07 NOTE — Progress Notes (Signed)
Subjective:    Patient ID: Julie Carter, female    DOB: [birth year], 36 y.o.   MRN: [id number]  Chief Complaint  Patient presents with   Medical Management of Chronic Issues    Mom states she is moody and hair is falling out. Flu shot today    PT presents to the office today for chronic follow up. She presents with mother. She has down syndrome.   Diabetes She presents for her follow-up diabetic visit. She has type 2 diabetes mellitus. Hypoglycemia symptoms include nervousness/anxiousness. Associated symptoms include fatigue. Pertinent negatives for diabetes include no blurred vision and no foot paresthesias. Pertinent negatives for diabetic complications include no heart disease. Risk factors for coronary artery disease include diabetes mellitus, dyslipidemia, hypertension and sedentary lifestyle. She is following a generally unhealthy diet. Her overall blood glucose range is 130-140 mg/dl. Eye exam is not current.  Thyroid Problem Presents for follow-up visit. Symptoms include anxiety, dry skin, fatigue, hair loss and weight gain. Patient reports no constipation or diarrhea. The symptoms have been stable.      Review of Systems  Constitutional:  Positive for fatigue and weight gain.  Eyes:  Negative for blurred vision.  Gastrointestinal:  Negative for constipation and diarrhea.  Psychiatric/Behavioral:  The patient is nervous/anxious.   All other systems reviewed and are negative.      Objective:   Physical Exam Vitals reviewed.  Constitutional:      General: She is not in acute distress.    Appearance: She is well-developed. She is obese.  HENT:     Head: Normocephalic and atraumatic.     Right Ear: Tympanic membrane normal.     Left Ear: Tympanic membrane normal.  Eyes:     Pupils: Pupils are equal, round, and reactive to light.  Neck:     Thyroid: No thyromegaly.  Cardiovascular:     Rate and Rhythm: Normal rate and regular rhythm.     Heart sounds: Normal heart  sounds. No murmur heard. Pulmonary:     Effort: Pulmonary effort is normal. No respiratory distress.     Breath sounds: Normal breath sounds. No wheezing.  Abdominal:     General: Bowel sounds are normal. There is no distension.     Palpations: Abdomen is soft.     Tenderness: There is no abdominal tenderness.  Musculoskeletal:        General: No tenderness. Normal range of motion.     Cervical back: Normal range of motion and neck supple.  Skin:    General: Skin is warm and dry.  Neurological:     Mental Status: She is alert and oriented to person, place, and time.     Cranial Nerves: No cranial nerve deficit.     Deep Tendon Reflexes: Reflexes are normal and symmetric.  Psychiatric:        Behavior: Behavior normal.        Thought Content: Thought content normal.        Judgment: Judgment normal.       11/07/2021    9:38 AM 07/03/2021   10:56 AM 04/24/2021   10:01 AM  Last 3 Weights  Weight (lbs) 229 lb 241 lb 12.8 oz 243 lb 6.4 oz  Weight (kg) 103.874 kg 109.68 kg 110.406 kg      BP 119/80   Pulse 91   Temp 98 F (36.7 C) (Temporal)   Ht _0  (1.499 m)   Wt 229 lb (103.9 kg)   BMI 46.25  kg/m       Assessment & Plan:  Julie Carter comes in today with chief complaint of Medical Management of Chronic Issues (Mom states she is moody and hair is falling out. Flu shot today )   Diagnosis and orders addressed:  1. Type 2 diabetes mellitus with other specified complication, without long-term current use of insulin (HCC) Will increase Ozempic to 2 mg from 1 mg  Low carb diet  - Empagliflozin-metFORMIN HCl ER (SYNJARDY XR) 12.05-998 MG TB24; Take 2 tablets by mouth daily.  Dispense: 180 tablet; Refill: 1 - Bayer DCA Hb A1c Waived - CMP14+EGFR - CBC with Differential/Platelet - Semaglutide, 2 MG/DOSE, 8 MG/3ML SOPN; Inject 2 mg as directed once a week.  Dispense: 9 mL; Refill: 2  2. Need for immunization against influenza - Flu Vaccine QUAD 22moIM (Fluarix,  Fluzone & Alfiuria Quad PF) - CMP14+EGFR - CBC with Differential/Platelet  3. Hypothyroidism, postradioiodine therapy - CMP14+EGFR - CBC with Differential/Platelet - TSH  4. Down syndrome - CMP14+EGFR - CBC with Differential/Platelet  5. Vitamin D deficiency - CMP14+EGFR - CBC with Differential/Platelet  6. Morbid obesity with BMI of 50.0-59.9, adult ([place] - CMP14+EGFR - CBC with Differential/Platelet   Labs pending Health Maintenance reviewed Diet and exercise encouraged  Follow up plan: 3 months    CEvelina Dun FNP

## 2021-11-07 NOTE — Telephone Encounter (Signed)
OZEMPIC, 2 MG/DOSE, 8 MG/3ML SOPN        Changed from: Semaglutide, 2 MG/DOSE, 8 MG/3ML SOPN    Pharmacy comment: Alternative Requested:PRIOR AUTH REQUIRED.

## 2021-11-08 ENCOUNTER — Telehealth: Payer: Self-pay | Admitting: Family Medicine

## 2021-11-08 LAB — CMP14+EGFR
ALT: 53 IU/L — ABNORMAL HIGH (ref 0–32)
AST: 35 IU/L (ref 0–40)
Albumin/Globulin Ratio: 1.8 (ref 1.2–2.2)
Albumin: 4.3 g/dL (ref 3.9–4.9)
Alkaline Phosphatase: 81 IU/L (ref 44–121)
BUN/Creatinine Ratio: 11 (ref 9–23)
BUN: 9 mg/dL (ref 6–20)
Bilirubin Total: 0.5 mg/dL (ref 0.0–1.2)
CO2: 25 mmol/L (ref 20–29)
Calcium: 9.7 mg/dL (ref 8.7–10.2)
Chloride: 99 mmol/L (ref 96–106)
Creatinine, Ser: 0.81 mg/dL (ref 0.57–1.00)
Globulin, Total: 2.4 g/dL (ref 1.5–4.5)
Glucose: 130 mg/dL — ABNORMAL HIGH (ref 70–99)
Potassium: 4.1 mmol/L (ref 3.5–5.2)
Sodium: 141 mmol/L (ref 134–144)
Total Protein: 6.7 g/dL (ref 6.0–8.5)
eGFR: 96 mL/min/{1.73_m2} (ref >59)

## 2021-11-08 LAB — CBC WITH DIFFERENTIAL/PLATELET
Basophils Absolute: 0.1 10*3/uL (ref 0.0–0.2)
Basos: 1 %
EOS (ABSOLUTE): 0.1 10*3/uL (ref 0.0–0.4)
Eos: 1 %
Hematocrit: 43.2 % (ref 34.0–46.6)
Hemoglobin: 14.1 g/dL (ref 11.1–15.9)
Immature Grans (Abs): 0 10*3/uL (ref 0.0–0.1)
Immature Granulocytes: 0 %
Lymphocytes Absolute: 1.3 10*3/uL (ref 0.7–3.1)
Lymphs: 18 %
MCH: 31 pg (ref 26.6–33.0)
MCHC: 32.6 g/dL (ref 31.5–35.7)
MCV: 95 fL (ref 79–97)
Monocytes Absolute: 0.4 10*3/uL (ref 0.1–0.9)
Monocytes: 6 %
Neutrophils Absolute: 5.2 10*3/uL (ref 1.4–7.0)
Neutrophils: 74 %
Platelets: 314 10*3/uL (ref 150–450)
RBC: 4.55 x10E6/uL (ref 3.77–5.28)
RDW: 15.6 % — ABNORMAL HIGH (ref 11.7–15.4)
WBC: 7.1 10*3/uL (ref 3.4–10.8)

## 2021-11-08 LAB — TSH: TSH: 0.187 u[IU]/mL — ABNORMAL LOW (ref 0.450–4.500)

## 2021-11-08 NOTE — Telephone Encounter (Signed)
Message from Plan This medication or product is on your plan's list of covered drugs. Prior authorization is not required at this time. If your pharmacy has questions regarding the processing of your prescription, please have them call the [facility] pharmacy help desk at [phone]. **Please note: This request was submitted electronically. Formulary lowering, tiering exception, cost reduction and/or pre-benefit determination review (including prospective Medicare hospice reviews) requests cannot be requested using this method of submission. Providers contact us at [phone] for further assistance.

## 2021-11-08 NOTE — Telephone Encounter (Signed)
Left detailed message with pharmacy

## 2021-11-08 NOTE — Telephone Encounter (Signed)
Called and left detailed message with pharmacy

## 2021-11-09 ENCOUNTER — Other Ambulatory Visit: Payer: Self-pay | Admitting: Family

## 2021-11-09 MED ORDER — LEVOTHYROXINE SODIUM 125 MCG PO TABS: 125.0000 ug | ORAL_TABLET | Freq: Every day | ORAL | 1 refills | Status: DC

## 2021-11-30 ENCOUNTER — Ambulatory Visit (INDEPENDENT_AMBULATORY_CARE_PROVIDER_SITE_OTHER): Payer: Medicare Other

## 2021-11-30 VITALS — Ht 64.0 in | Wt 229.0 lb

## 2021-11-30 DIAGNOSIS — Z0000 Encounter for general adult medical examination without abnormal findings: Secondary | ICD-10-CM | POA: Diagnosis not present

## 2021-11-30 NOTE — Patient Instructions (Signed)
Julie Carter , Thank you for taking time to come for your Medicare Wellness Visit. I appreciate your ongoing commitment to your health goals. Please review the following plan we discussed and let me know if I can assist you in the future.   These are the goals we discussed:  Goals      DIET - REDUCE SUGAR INTAKE        This is a list of the screening recommended for you and due dates:  Health Maintenance  Topic Date Due   COVID-19 Vaccine (1) Never done   Eye exam for diabetics  04/11/2016   Pap Smear  03/22/2018   Hemoglobin A1C  05/08/2022   Yearly kidney health urinalysis for diabetes  07/04/2022   Complete foot exam   07/25/2022   Yearly kidney function blood test for diabetes  11/08/2022   Medicare Annual Wellness Visit  12/01/2022   DTaP/Tdap/Td vaccine (4 - Td or Tdap) 04/07/2029   Flu Shot  Completed   Hepatitis C Screening: USPSTF Recommendation to screen - Ages 18-79 yo.  Completed   HIV Screening  Completed   HPV Vaccine  Aged Out    Advanced directives: Advance directive discussed with you today. I have provided a copy for you to complete at home and have notarized. Once this is complete please bring a copy in to our office so we can scan it into your chart.   Conditions/risks identified: Aim for 30 minutes of exercise or brisk walking, 6-8 glasses of water, and 5 servings of fruits and vegetables each day.   Next appointment: Follow up in one year for your annual wellness visit.   Preventive Care 69-36 Years Old, Female Preventive care refers to lifestyle choices and visits with your health care provider that can promote health and wellness. Preventive care visits are also called wellness exams. What can I expect for my preventive care visit? Counseling During your preventive care visit, your health care provider may ask about your: Medical history, including: Past medical problems. Family medical history. Pregnancy history. Current health, including: Menstrual  cycle. Method of birth control. Emotional well-being. Home life and relationship well-being. Sexual activity and sexual health. Lifestyle, including: Alcohol, nicotine or tobacco, and drug use. Access to firearms. Diet, exercise, and sleep habits. Work and work Statistician. Sunscreen use. Safety issues such as seatbelt and bike helmet use. Physical exam Your health care provider may check your: Height and weight. These may be used to calculate your BMI (body mass index). BMI is a measurement that tells if you are at a healthy weight. Waist circumference. This measures the distance around your waistline. This measurement also tells if you are at a healthy weight and may help predict your risk of certain diseases, such as type 2 diabetes and high blood pressure. Heart rate and blood pressure. Body temperature. Skin for abnormal spots. What immunizations do I need? Vaccines are usually given at various ages, according to a schedule. Your health care provider will recommend vaccines for you based on your age, medical history, and lifestyle or other factors, such as travel or where you work. What tests do I need? Screening Your health care provider may recommend screening tests for certain conditions. This may include: Pelvic exam and Pap test. Lipid and cholesterol levels. Diabetes screening. This is done by checking your blood sugar (glucose) after you have not eaten for a while (fasting). Hepatitis B test. Hepatitis C test. HIV (human immunodeficiency virus) test. STI (sexually transmitted infection) testing, if  you are at risk. BRCA-related cancer screening. This may be done if you have a family history of breast, ovarian, tubal, or peritoneal cancers. Talk with your health care provider about your test results, treatment options, and if necessary, the need for more tests. Follow these instructions at home: Eating and drinking  Eat a healthy diet that includes fresh fruits and  vegetables, whole grains, lean protein, and low-fat dairy products. Take vitamin and mineral supplements as recommended by your health care provider. Do not drink alcohol if: Your health care provider tells you not to drink. You are pregnant, may be pregnant, or are planning to become pregnant. If you drink alcohol: Limit how much you have to 0-1 drink a day. Know how much alcohol is in your drink. In the [place] one drink equals one 12 oz bottle of beer (355 mL), one 5 oz glass of wine (148 mL), or one 1 oz glass of hard liquor (44 mL). Lifestyle Brush your teeth every morning and night with fluoride toothpaste. Floss one time each day. Exercise for at least 30 minutes 5 or more days each week. Do not use any products that contain nicotine or tobacco. These products include cigarettes, chewing tobacco, and vaping devices, such as e-cigarettes. If you need help quitting, ask your health care provider. Do not use drugs. If you are sexually active, practice safe sex. Use a condom or other form of protection to prevent STIs. If you do not wish to become pregnant, use a form of birth control. If you plan to become pregnant, see your health care provider for a prepregnancy visit. Find healthy ways to manage stress, such as: Meditation, yoga, or listening to music. Journaling. Talking to a trusted person. Spending time with friends and family. Minimize exposure to UV radiation to reduce your risk of skin cancer. Safety Always wear your seat belt while driving or riding in a vehicle. Do not drive: If you have been drinking alcohol. Do not ride with someone who has been drinking. If you have been using any mind-altering substances or drugs. While texting. When you are tired or distracted. Wear a helmet and other protective equipment during sports activities. If you have firearms in your house, make sure you follow all gun safety procedures. Seek help if you have been physically or sexually  abused. What's next? Go to your health care provider once a year for an annual wellness visit. Ask your health care provider how often you should have your eyes and teeth checked. Stay up to date on all vaccines. This information is not intended to replace advice given to you by your health care provider. Make sure you discuss any questions you have with your health care provider. Document Revised: 06/15/2020 Document Reviewed: 06/15/2020 Elsevier Patient Education  [place].

## 2021-11-30 NOTE — Progress Notes (Signed)
Subjective:   Julie Carter is a 36 y.o. female who presents for Medicare Annual (Subsequent) preventive examination. I connected with  FLYNN LININGER on 11/30/21 by a audio enabled telemedicine application and verified that I am speaking with the correct person using two identifiers.  Patient Location: Home  Provider Location: Home Office  I discussed the limitations of evaluation and management by telemedicine. The patient expressed understanding and agreed to proceed.  Review of Systems     Cardiac Risk Factors include: advanced age (>88mn, >>35women);diabetes mellitus     Objective:    Today's Vitals   11/30/21 1448  Weight: 229 lb (103.9 kg)  Height: _0  (1.626 m)   Body mass index is 39.31 kg/m.     11/30/2021    2:51 PM 11/08/2020    3:51 PM 06/17/2018    2:40 PM 01/14/2015    8:02 PM 01/14/2015   12:46 PM  Advanced Directives  Does Patient Have a Medical Advance Directive? No Unable to assess, patient is non-responsive or altered mental status No No No  Would patient like information on creating a medical advance directive? No - Patient declined  No - Patient declined No - patient declined information No - patient declined information    Current Medications (verified) Outpatient Encounter Medications as of 11/30/2021  Medication Sig   ACCU-CHEK GUIDE test strip TEST UP TO 4 TIMES DAILY AS DIRECTED   Accu-Chek Softclix Lancets lancets Test BS up to 4 times daily dx E11.9   Blood Glucose Monitoring Suppl (ACCU-CHEK GUIDE ME) w/Device KIT USE UP TO FOUR TIMES DAILY AS DIRECTED.DX E11.9   DENTA 5000 PLUS 1.1 % CREA dental cream Take by mouth as directed.   Empagliflozin-metFORMIN HCl ER (SYNJARDY XR) 12.05-998 MG TB24 Take 2 tablets by mouth daily.   levothyroxine (SYNTHROID) 125 MCG tablet Take 1 tablet (125 mcg total) by mouth daily.   Semaglutide, 2 MG/DOSE, 8 MG/3ML SOPN Inject 2 mg as directed once a week.   triamcinolone ointment (KENALOG) 0.5 % Apply 1  application topically 2 (two) times daily.   Vitamin D, Ergocalciferol, (DRISDOL) 1.25 MG (50000 UNIT) CAPS capsule Take 1 capsule by mouth one timer per week   No facility-administered encounter medications on file as of 11/30/2021.    Allergies (verified) Patient has no known allergies.   History: Past Medical History:  Diagnosis Date   [place]    Congenital nystagmus    Diabetes mellitus    Down's syndrome    Heart murmur    [place]    Past Surgical History:  Procedure Laterality Date   HERNIA REPAIR     umbilical   Family History  Problem Relation Age of Onset   Diabetes Mother    Hypertension Mother    Hypertension Father    Social History   Socioeconomic History   Marital status: Single    Spouse name: Not on file   Number of children: 0   Years of education: 10   Highest education level: 10th grade  Occupational History   Occupation: Disabled  Tobacco Use   Smoking status: Never   Smokeless tobacco: Never  Vaping Use   Vaping Use: Never used  Substance and Sexual Activity   Alcohol use: No    Alcohol/week: 0.0 standard drinks of alcohol   Drug use: No   Sexual activity: Never  Other Topics Concern   Not on file  Social History Narrative   Pt has Downs Syndrome  and lives at home with her parents who take care of everything for her.   Social Determinants of Health   Financial Resource Strain: Low Risk  (11/30/2021)   Overall Financial Resource Strain (CARDIA)    Difficulty of Paying Living Expenses: Not hard at all  Food Insecurity: No Food Insecurity (11/30/2021)   Hunger Vital Sign    Worried About Running Out of Food in the Last Year: Never true    Ran Out of Food in the Last Year: Never true  Transportation Needs: No Transportation Needs (11/30/2021)   PRAPARE - Hydrologist (Medical): No    Lack of Transportation (Non-Medical): No  Physical Activity: Insufficiently Active (11/30/2021)   Exercise  Vital Sign    Days of Exercise per Week: 3 days    Minutes of Exercise per Session: 30 min  Stress: No Stress Concern Present (11/30/2021)   [place]    Feeling of Stress : Not at all  Social Connections: Socially Isolated (11/30/2021)   Social Connection and Isolation Panel [NHANES]    Frequency of Communication with Friends and Family: More than three times a week    Frequency of Social Gatherings with Friends and Family: More than three times a week    Attends Religious Services: Never    Marine scientist or Organizations: No    Attends Music therapist: Never    Marital Status: Never married    Tobacco Counseling Counseling given: Not Answered   Clinical Intake:  Pre-visit preparation completed: Yes  Pain : No/denies pain     Nutritional Risks: None Diabetes: No  How often do you need to have someone help you when you read instructions, pamphlets, or other written materials from your doctor or pharmacy?: 1 - Never  Diabetic?yes  Nutrition Risk Assessment:  Has the patient had any N/V/D within the last 2 months?  No  Does the patient have any non-healing wounds?  No  Has the patient had any unintentional weight loss or weight gain?  No   Diabetes:  Is the patient diabetic?  Yes  If diabetic, was a CBG obtained today?  No  Did the patient bring in their glucometer from home?  No  How often do you monitor your CBG's? 2 x week .   Financial Strains and Diabetes Management:  Are you having any financial strains with the device, your supplies or your medication? No .  Does the patient want to be seen by Chronic Care Management for management of their diabetes?  No  Would the patient like to be referred to a Nutritionist or for Diabetic Management?  No   Diabetic Exams:  Diabetic Eye Exam: Overdue for diabetic eye exam. Pt has been advised about the importance in completing this  exam. Patient advised to call and schedule an eye exam. Diabetic Foot Exam: Overdue, Pt has been advised about the importance in completing this exam. Pt is scheduled for diabetic foot exam on next office visit .   Interpreter Needed?: No  Information entered by :: Jadene Pierini, LPN   Activities of Daily Living    11/30/2021    2:51 PM  In your present state of health, do you have any difficulty performing the following activities:  Hearing? 0  Vision? 0  Difficulty concentrating or making decisions? 0  Walking or climbing stairs? 0  Dressing or bathing? 0  Doing errands, shopping? 0  Preparing  Food and eating ? N  Using the Toilet? N  In the past six months, have you accidently leaked urine? N  Do you have problems with loss of bowel control? N  Managing your Medications? N  Managing your Finances? N  Housekeeping or managing your Housekeeping? N    Patient Care Team: [name], FNP as PCP - General (Nurse Practitioner) [name], LCSW as [place] Management (Licensed Clinical Social Worker)  Indicate any recent [place] you may have received from other than [facility] providers in the past year (date may be approximate).     Assessment:   This is a routine wellness examination for [place].  Hearing/Vision screen Hearing Screening - Comments:: Due Mother to schedule   Dietary issues and exercise activities discussed: Current Exercise Habits: Home exercise routine, Type of exercise: walking, Time (Minutes): 30, Frequency (Times/Week): 3, Weekly Exercise (Minutes/Week): 90, Intensity: Mild, Exercise limited by: None identified   Goals Addressed             This Visit's Progress    DIET - REDUCE SUGAR INTAKE   On track      Depression Screen    11/30/2021    2:50 PM 11/07/2021    9:41 AM 07/03/2021   10:59 AM 11/08/2020    3:33 PM 04/28/2020   11:38 AM 04/21/2020   10:43 AM 09/01/2019   11:23 AM  PHQ 2/9 Scores  PHQ - 2  Score 0 0 0 0 0 2 0  PHQ- 9 Score 0 4 0   5     Fall Risk    11/30/2021    2:49 PM 11/07/2021    9:41 AM 07/03/2021   10:59 AM 04/24/2021   10:00 AM 11/08/2020    3:51 PM  Fall Risk   Falls in the past year? 0 0 0 0 0  Number falls in past yr: 0    0  Injury with Fall? 0    0  Risk for fall due to : No Fall Risks    No Fall Risks  Follow up Falls prevention discussed    Falls prevention discussed    FALL RISK PREVENTION PERTAINING TO THE HOME:  Any stairs in or around the home? No  If so, are there any without handrails? No  Home free of loose throw rugs in walkways, pet beds, electrical cords, etc? Yes  Adequate lighting in your home to reduce risk of falls? Yes   ASSISTIVE DEVICES UTILIZED TO PREVENT FALLS:  Life alert? No  Use of a cane, walker or w/c? No  Grab bars in the bathroom? Yes  Shower chair or bench in shower? Yes  Elevated toilet seat or a handicapped toilet? Yes       11/08/2020    3:53 PM 06/17/2018    2:46 PM  MMSE - Mini Mental State Exam  Not completed: Unable to complete Unable to complete        11/30/2021    2:51 PM  6CIT Screen  What Year? 0 points  What month? 3 points  What time? 3 points  Count back from 20 0 points  Months in reverse 2 points  Repeat phrase 4 points  Total Score 12 points    Immunizations Immunization History  Administered Date(s) Administered   DTaP 04/11/1984, 09/18/1990   Influenza Split 08/14/2011   Influenza,inj,Quad PF,6+ Mos 11/02/2014, 09/22/2015, 09/23/2018, 11/07/2021   Pneumococcal Conjugate-13 03/22/2015   Pneumococcal Polysaccharide-23 01/02/2007   Tdap  04/08/2019    TDAP status: Up to date  Flu Vaccine status: Up to date  Pneumococcal vaccine status: Up to date  Covid-19 vaccine status: Completed vaccines  Qualifies for Shingles Vaccine? No   Zostavax completed No   Shingrix Completed?: No.    Education has been provided regarding the importance of this vaccine. Patient has been advised to  call insurance company to determine out of pocket expense if they have not yet received this vaccine. Advised may also receive vaccine at local pharmacy or Health Dept. Verbalized acceptance and understanding.  Screening Tests Health Maintenance  Topic Date Due   COVID-19 Vaccine (1) Never done   OPHTHALMOLOGY EXAM  04/11/2016   PAP SMEAR-Modifier  03/22/2018   HEMOGLOBIN A1C  05/08/2022   Diabetic kidney evaluation - Urine ACR  07/04/2022   FOOT EXAM  07/25/2022   Diabetic kidney evaluation - GFR measurement  11/08/2022   Medicare Annual Wellness (AWV)  12/01/2022   DTaP/Tdap/Td (4 - Td or Tdap) 04/07/2029   INFLUENZA VACCINE  Completed   Hepatitis C Screening  Completed   HIV Screening  Completed   HPV VACCINES  Aged Out    Health Maintenance  Health Maintenance Due  Topic Date Due   COVID-19 Vaccine (1) Never done   OPHTHALMOLOGY EXAM  04/11/2016   PAP SMEAR-Modifier  03/22/2018    Colorectal cancer screening: No longer required.   Mammogram status: No longer required due to age.  Bone Density status: Ordered not of age . Pt provided with contact info and advised to call to schedule appt.  Lung Cancer Screening: (Low Dose CT Chest recommended if Age 45-80 years, 30 pack-year currently smoking OR have quit w/in 15years.) does not qualify.   Lung Cancer Screening Referral: n/a  Additional Screening:  Hepatitis C Screening: does not qualify;   Vision Screening: Recommended annual ophthalmology exams for early detection of glaucoma and other disorders of the eye. Is the patient up to date with their annual eye exam?  No  Who is the provider or what is the name of the office in which the patient attends annual eye exams? None , Mother to schedule appointment  If pt is not established with a provider, would they like to be referred to a provider to establish care? No .   Dental Screening: Recommended annual dental exams for proper oral hygiene  Community Resource  Referral / Chronic Care Management: CRR required this visit?  No   CCM required this visit?  No      Plan:     I have personally reviewed and noted the following in the patient's chart:   Medical and social history Use of alcohol, tobacco or illicit drugs  Current medications and supplements including opioid prescriptions. Patient is not currently taking opioid prescriptions. Functional ability and status Nutritional status Physical activity Advanced directives List of other physicians Hospitalizations, surgeries, and ER visits in previous 12 months Vitals Screenings to include cognitive, depression, and falls Referrals and appointments  In addition, I have reviewed and discussed with patient certain preventive protocols, quality metrics, and best practice recommendations. A written personalized care plan for preventive services as well as general preventive health recommendations were provided to patient.     Daphane Shepherd, LPN   [id number]   Nurse Notes: Due eye exam

## 2021-12-09 ENCOUNTER — Other Ambulatory Visit: Payer: Self-pay | Admitting: Family

## 2021-12-09 DIAGNOSIS — E1169 Type 2 diabetes mellitus with other specified complication: Secondary | ICD-10-CM

## 2021-12-12 ENCOUNTER — Telehealth: Payer: Self-pay | Admitting: Family

## 2021-12-12 DIAGNOSIS — E1169 Type 2 diabetes mellitus with other specified complication: Secondary | ICD-10-CM

## 2021-12-13 NOTE — Telephone Encounter (Signed)
Julie Carter is requesting incorrect dose of '1mg'$ . Pt was changed to '2mg'$  on 11/07/21 with Rx sent for 73m w/ 2 refills, this should be on file at pharmacy. Instructed to talk w/ someone at pharmacy to let them know they are needing the '2mg'$  dose.

## 2021-12-28 ENCOUNTER — Other Ambulatory Visit: Payer: Self-pay | Admitting: Family

## 2021-12-28 DIAGNOSIS — E1169 Type 2 diabetes mellitus with other specified complication: Secondary | ICD-10-CM

## 2021-12-28 DIAGNOSIS — E559 Vitamin D deficiency, unspecified: Secondary | ICD-10-CM

## 2022-01-09 ENCOUNTER — Encounter: Payer: Self-pay | Admitting: Family

## 2022-01-09 ENCOUNTER — Ambulatory Visit (INDEPENDENT_AMBULATORY_CARE_PROVIDER_SITE_OTHER): Payer: Medicare Other | Admitting: Family

## 2022-01-09 VITALS — BP 107/65 | HR 67 | Temp 97.7°F | Ht 64.0 in | Wt 227.6 lb

## 2022-01-09 DIAGNOSIS — E890 Postprocedural hypothyroidism: Secondary | ICD-10-CM

## 2022-01-09 DIAGNOSIS — Q909 Down syndrome, unspecified: Secondary | ICD-10-CM | POA: Diagnosis not present

## 2022-01-09 DIAGNOSIS — F411 Generalized anxiety disorder: Secondary | ICD-10-CM

## 2022-01-09 DIAGNOSIS — Z6841 Body Mass Index (BMI) 40.0 and over, adult: Secondary | ICD-10-CM

## 2022-01-09 MED ORDER — ESCITALOPRAM OXALATE 10 MG PO TABS: 10.0000 mg | ORAL_TABLET | Freq: Every day | ORAL | 3 refills | Status: DC

## 2022-01-09 MED ORDER — LEVOTHYROXINE SODIUM 137 MCG PO TABS: 137.0000 ug | ORAL_TABLET | Freq: Every day | ORAL | 1 refills | Status: DC

## 2022-01-09 NOTE — Patient Instructions (Signed)

## 2022-01-09 NOTE — Progress Notes (Signed)
Subjective:    Patient ID: Julie Carter, female    DOB: 10/19/1985, 37 y.o.   MRN: [id number]  Chief Complaint  Patient presents with   Hypothyroidism   Anxiety    Getting really scared at night time will not sleep  started about 2 mths ago. Saying off the wall stuff. Someone clise to her died around this time. She will not sleep by her self     PT presents to the office today for follow up on thyroid. She was seen two months ago and her TSH was 0.187 and we decreased her levothryoxine to 125 mcg from 137 mcg. However, mother states she never started the newer dose and has been continued at the 137 mcg dose.   Also, she had a friend pass away in November. Since then she has had increased anxiety, will not sleep alone, and will lock herself in her room. Mother reports she has been having to sleep with her and close the door and move something in front of the door.  Anxiety Presents for follow-up visit. Symptoms include depressed mood, excessive worry, irritability, nervous/anxious behavior and restlessness. Patient reports no hyperventilation. Symptoms occur most days. The severity of symptoms is interfering with daily activities.    Thyroid Problem Presents for follow-up visit. Symptoms include anxiety, depressed mood, fatigue and hair loss. Patient reports no hoarse voice. The symptoms have been stable.      Review of Systems  Constitutional:  Positive for fatigue and irritability.  HENT:  Negative for hoarse voice.   Psychiatric/Behavioral:  The patient is nervous/anxious.   All other systems reviewed and are negative.      Objective:   Physical Exam Vitals reviewed.  Constitutional:      General: She is not in acute distress.    Appearance: She is morbidly obese.  HENT:     Head: Atraumatic. Microcephalic.     Right Ear: Tympanic membrane normal.     Left Ear: Tympanic membrane normal.  Eyes:     Pupils: Pupils are equal, round, and reactive to light.  Neck:      Thyroid: No thyromegaly.  Cardiovascular:     Rate and Rhythm: Normal rate and regular rhythm.     Heart sounds: Normal heart sounds. No murmur heard. Pulmonary:     Effort: Pulmonary effort is normal. No respiratory distress.     Breath sounds: Normal breath sounds. No wheezing.  Abdominal:     General: Bowel sounds are normal. There is no distension.     Palpations: Abdomen is soft.     Tenderness: There is no abdominal tenderness.  Musculoskeletal:        General: No tenderness. Normal range of motion.     Cervical back: Normal range of motion and neck supple.  Skin:    General: Skin is warm and dry.  Neurological:     Mental Status: She is alert and oriented to person, place, and time.     Cranial Nerves: No cranial nerve deficit.     Deep Tendon Reflexes: Reflexes are normal and symmetric.  Psychiatric:        Behavior: Behavior normal.        Thought Content: Thought content normal.        Judgment: Judgment normal.     BP 107/65   Pulse 67   Temp 97.7 F (36.5 C) (Temporal)   Ht '5\' 4"'$  (1.626 m)   Wt 227 lb 9.6 oz (103.2 kg)  SpO2 98%   BMI 39.07 kg/m        Assessment & Plan:  Julie Carter comes in today with chief complaint of Hypothyroidism and Anxiety (Getting really scared at night time will not sleep  started about 2 mths ago. Saying off the wall stuff. Someone clise to her died around this time. She will not sleep by her self  )   Diagnosis and orders addressed:  1. Hypothyroidism, postradioiodine therapy Labs pending, will change dose accordingly  - TSH  2. Morbid obesity with BMI of 50.0-59.9, adult ([place])  3. Down syndrome   4. GAD (generalized anxiety disorder) Will start lexapro 10 mg  Stress management  - escitalopram (LEXAPRO) 10 MG tablet; Take 1 tablet (10 mg total) by mouth daily.  Dispense: 90 tablet; Refill: 3   Labs pending Health Maintenance reviewed Diet and exercise encouraged  Follow up plan: 1 month to recheck  GAD   Evelina Dun, FNP

## 2022-01-10 LAB — TSH: TSH: 0.772 u[IU]/mL (ref 0.450–4.500)

## 2022-01-19 ENCOUNTER — Encounter: Payer: Self-pay | Admitting: Family

## 2022-01-19 ENCOUNTER — Ambulatory Visit (INDEPENDENT_AMBULATORY_CARE_PROVIDER_SITE_OTHER): Payer: 59 | Admitting: Family

## 2022-01-19 VITALS — BP 136/88 | HR 86 | Temp 98.0°F | Ht 64.0 in | Wt 223.0 lb

## 2022-01-19 DIAGNOSIS — N3001 Acute cystitis with hematuria: Secondary | ICD-10-CM

## 2022-01-19 DIAGNOSIS — F320 Major depressive disorder, single episode, mild: Secondary | ICD-10-CM | POA: Insufficient documentation

## 2022-01-19 DIAGNOSIS — R300 Dysuria: Secondary | ICD-10-CM | POA: Diagnosis not present

## 2022-01-19 LAB — URINALYSIS, COMPLETE
Bilirubin, UA: NEGATIVE
Ketones, UA: NEGATIVE
Nitrite, UA: NEGATIVE
Protein,UA: NEGATIVE
Specific Gravity, UA: 1.025 (ref 1.005–1.030)
Urobilinogen, Ur: 0.2 mg/dL (ref 0.2–1.0)
pH, UA: 5 (ref 5.0–7.5)

## 2022-01-19 LAB — MICROSCOPIC EXAMINATION: Renal Epithel, UA: NONE SEEN /hpf

## 2022-01-19 MED ORDER — CEPHALEXIN 500 MG PO CAPS: 500.0000 mg | ORAL_CAPSULE | Freq: Two times a day (BID) | ORAL | 0 refills | Status: DC

## 2022-01-19 NOTE — Patient Instructions (Signed)
Urinary Tract Infection, Adult  A urinary tract infection (UTI) is an infection of any part of the urinary tract. The urinary tract includes the kidneys, ureters, bladder, and urethra. These organs make, store, and get rid of urine in the body. An upper UTI affects the ureters and kidneys. A lower UTI affects the bladder and urethra. What are the causes? Most urinary tract infections are caused by bacteria in your genital area around your urethra, where urine leaves your body. These bacteria grow and cause inflammation of your urinary tract. What increases the risk? You are more likely to develop this condition if: You have a urinary catheter that stays in place. You are not able to control when you urinate or have a bowel movement (incontinence). You are female and you: Use a spermicide or diaphragm for birth control. Have low estrogen levels. Are pregnant. You have certain genes that increase your risk. You are sexually active. You take antibiotic medicines. You have a condition that causes your flow of urine to slow down, such as: An enlarged prostate, if you are female. Blockage in your urethra. A kidney stone. A nerve condition that affects your bladder control (neurogenic bladder). Not getting enough to drink, or not urinating often. You have certain medical conditions, such as: Diabetes. A weak disease-fighting system (immunesystem). Sickle cell disease. Gout. Spinal cord injury. What are the signs or symptoms? Symptoms of this condition include: Needing to urinate right away (urgency). Frequent urination. This may include small amounts of urine each time you urinate. Pain or burning with urination. Blood in the urine. Urine that smells bad or unusual. Trouble urinating. Cloudy urine. Vaginal discharge, if you are female. Pain in the abdomen or the lower back. You may also have: Vomiting or a decreased appetite. Confusion. Irritability or tiredness. A fever or  chills. Diarrhea. The first symptom in older adults may be confusion. In some cases, they may not have any symptoms until the infection has worsened. How is this diagnosed? This condition is diagnosed based on your medical history and a physical exam. You may also have other tests, including: Urine tests. Blood tests. Tests for STIs (sexually transmitted infections). If you have had more than one UTI, a cystoscopy or imaging studies may be done to determine the cause of the infections. How is this treated? Treatment for this condition includes: Antibiotic medicine. Over-the-counter medicines to treat discomfort. Drinking enough water to stay hydrated. If you have frequent infections or have other conditions such as a kidney stone, you may need to see a health care provider who specializes in the urinary tract (urologist). In rare cases, urinary tract infections can cause sepsis. Sepsis is a life-threatening condition that occurs when the body responds to an infection. Sepsis is treated in the hospital with IV antibiotics, fluids, and other medicines. Follow these instructions at home:  Medicines Take over-the-counter and prescription medicines only as told by your health care provider. If you were prescribed an antibiotic medicine, take it as told by your health care provider. Do not stop using the antibiotic even if you start to feel better. General instructions Make sure you: Empty your bladder often and completely. Do not hold urine for long periods of time. Empty your bladder after sex. Wipe from front to back after urinating or having a bowel movement if you are female. Use each tissue only one time when you wipe. Drink enough fluid to keep your urine pale yellow. Keep all follow-up visits. This is important. Contact a health   care provider if: Your symptoms do not get better after 1-2 days. Your symptoms go away and then return. Get help right away if: You have severe pain in  your back or your lower abdomen. You have a fever or chills. You have nausea or vomiting. Summary A urinary tract infection (UTI) is an infection of any part of the urinary tract, which includes the kidneys, ureters, bladder, and urethra. Most urinary tract infections are caused by bacteria in your genital area. Treatment for this condition often includes antibiotic medicines. If you were prescribed an antibiotic medicine, take it as told by your health care provider. Do not stop using the antibiotic even if you start to feel better. Keep all follow-up visits. This is important. This information is not intended to replace advice given to you by your health care provider. Make sure you discuss any questions you have with your health care provider. Document Revised: 07/31/2019 Document Reviewed: 07/31/2019 Elsevier Patient Education  2023 [employer]  

## 2022-01-19 NOTE — Progress Notes (Signed)
Subjective:    Patient ID: Julie Carter, female    DOB: June 30, 1985, 37 y.o.   MRN: [id number]  Chief Complaint  Patient presents with   Dysuria   Depression    Dysuria  This is a new problem. The current episode started in the past 7 days. The problem occurs every urination. The problem has been unchanged. The quality of the pain is described as burning. The pain is moderate. Associated symptoms include frequency and urgency. Pertinent negatives include no flank pain, hematuria, nausea or vomiting. She has tried increased fluids for the symptoms. The treatment provided mild relief.  Depression        This is a recurrent problem.  The current episode started more than 1 month ago.   The onset quality is gradual.   Associated symptoms include fatigue, restlessness and sad.     The symptoms are aggravated by family issues.  Past treatments include SSRIs - Selective serotonin reuptake inhibitors.     Review of Systems  Constitutional:  Positive for fatigue.  Gastrointestinal:  Negative for nausea and vomiting.  Genitourinary:  Positive for dysuria, frequency and urgency. Negative for flank pain and hematuria.  Psychiatric/Behavioral:  Positive for depression.   All other systems reviewed and are negative.      Objective:   Physical Exam Vitals reviewed.  Constitutional:      General: She is not in acute distress.    Appearance: She is well-developed.  HENT:     Head: Normocephalic and atraumatic.  Eyes:     Pupils: Pupils are equal, round, and reactive to light.  Neck:     Thyroid: No thyromegaly.  Cardiovascular:     Rate and Rhythm: Normal rate and regular rhythm.     Heart sounds: Normal heart sounds. No murmur heard. Pulmonary:     Effort: Pulmonary effort is normal. No respiratory distress.     Breath sounds: Normal breath sounds. No wheezing.  Abdominal:     General: Bowel sounds are normal. There is no distension.     Palpations: Abdomen is soft.      Tenderness: There is no abdominal tenderness.  Musculoskeletal:        General: No tenderness. Normal range of motion.     Cervical back: Normal range of motion and neck supple.  Skin:    General: Skin is warm and dry.  Neurological:     Mental Status: She is alert and oriented to person, place, and time.     Cranial Nerves: No cranial nerve deficit.     Deep Tendon Reflexes: Reflexes are normal and symmetric.  Psychiatric:        Behavior: Behavior normal.        Thought Content: Thought content normal.        Judgment: Judgment normal.      BP 136/88   Pulse 86   Temp 98 F (36.7 C) (Temporal)   Ht '5\' 4"'$  (1.626 m)   Wt 223 lb (101.2 kg)   BMI 38.28 kg/m       Assessment & Plan:  Julie Carter comes in today with chief complaint of Dysuria and Depression   Diagnosis and orders addressed:  1. Dysuria - Urinalysis, Complete - Urine Culture  2. Acute cystitis with hematuria Force fluids AZO over the counter X2 days Follow up if symptoms worsen or do not improve  - cephALEXin (KEFLEX) 500 MG capsule; Take 1 capsule (500 mg total) by mouth 2 (two) times  daily.  Dispense: 14 capsule; Refill: 0  3. Depression, major, single episode, mild ([place]) Continue Lexapro     Health Maintenance reviewed Diet and exercise encouraged  Follow up plan: Keep chronic follow up   Evelina Dun, FNP

## 2022-01-21 LAB — URINE CULTURE

## 2022-02-05 ENCOUNTER — Ambulatory Visit: Payer: 59 | Admitting: Podiatry

## 2022-02-08 ENCOUNTER — Ambulatory Visit (INDEPENDENT_AMBULATORY_CARE_PROVIDER_SITE_OTHER): Payer: 59 | Admitting: Family

## 2022-02-08 ENCOUNTER — Encounter: Payer: Self-pay | Admitting: Family

## 2022-02-08 VITALS — BP 130/91 | HR 75 | Temp 97.7°F | Ht 63.0 in | Wt 225.6 lb

## 2022-02-08 DIAGNOSIS — F320 Major depressive disorder, single episode, mild: Secondary | ICD-10-CM | POA: Diagnosis not present

## 2022-02-08 DIAGNOSIS — Z6841 Body Mass Index (BMI) 40.0 and over, adult: Secondary | ICD-10-CM

## 2022-02-08 DIAGNOSIS — Z0001 Encounter for general adult medical examination with abnormal findings: Secondary | ICD-10-CM | POA: Diagnosis not present

## 2022-02-08 DIAGNOSIS — B3731 Acute candidiasis of vulva and vagina: Secondary | ICD-10-CM | POA: Diagnosis not present

## 2022-02-08 DIAGNOSIS — E1169 Type 2 diabetes mellitus with other specified complication: Secondary | ICD-10-CM

## 2022-02-08 DIAGNOSIS — Z0000 Encounter for general adult medical examination without abnormal findings: Secondary | ICD-10-CM | POA: Diagnosis not present

## 2022-02-08 DIAGNOSIS — E890 Postprocedural hypothyroidism: Secondary | ICD-10-CM

## 2022-02-08 DIAGNOSIS — R739 Hyperglycemia, unspecified: Secondary | ICD-10-CM | POA: Diagnosis not present

## 2022-02-08 DIAGNOSIS — E559 Vitamin D deficiency, unspecified: Secondary | ICD-10-CM | POA: Diagnosis not present

## 2022-02-08 DIAGNOSIS — Q909 Down syndrome, unspecified: Secondary | ICD-10-CM

## 2022-02-08 LAB — BAYER DCA HB A1C WAIVED: HB A1C (BAYER DCA - WAIVED): 6.7 % — ABNORMAL HIGH (ref 4.8–5.6)

## 2022-02-08 MED ORDER — FLUCONAZOLE 150 MG PO TABS: 150.0000 mg | ORAL_TABLET | ORAL | 0 refills | Status: DC | PRN

## 2022-02-08 NOTE — Patient Instructions (Signed)

## 2022-02-08 NOTE — Progress Notes (Signed)
Subjective:    Patient ID: Julie Carter, female    DOB: 02-15-1985, 37 y.o.   MRN: [id number]  Chief Complaint  Patient presents with   Medical Management of Chronic Issues   Dysuria    Vaginal area is red with a little white discharge. Burns still when she urinates    PT presents to the office today for CPE and  chronic follow up. She presents with mother. She has down syndrome.    Mother states patient still complaining of dysuria and having thick white discharge.   Dysuria  This is a recurrent problem. The current episode started 1 to 4 weeks ago. The problem occurs intermittently. The quality of the pain is described as burning. Associated symptoms include frequency. Pertinent negatives include no flank pain, hematuria, urgency or vomiting.  Diabetes She has type 2 diabetes mellitus. Pertinent negatives for hypoglycemia include no nervousness/anxiousness. Associated symptoms include fatigue. Pertinent negatives for diabetes include no blurred vision and no foot paresthesias. Risk factors for coronary artery disease include dyslipidemia, diabetes mellitus, hypertension and sedentary lifestyle. She is following a generally unhealthy diet. (Does not check glucose at home) Eye exam is not current.  Thyroid Problem Presents for follow-up visit. Symptoms include fatigue. Patient reports no anxiety, constipation or diarrhea. The symptoms have been stable.  Depression        This is a chronic problem.  The current episode started more than 1 year ago.   Associated symptoms include fatigue and sad.  Associated symptoms include no helplessness, no hopelessness and no decreased interest.  Past treatments include SSRIs - Selective serotonin reuptake inhibitors.  Past medical history includes thyroid problem.   Vaginal Discharge The patient's primary symptoms include vaginal discharge. This is a new problem. The current episode started 1 to 4 weeks ago. Associated symptoms include dysuria and  frequency. Pertinent negatives include no constipation, diarrhea, flank pain, hematuria, urgency or vomiting.      Review of Systems  Constitutional:  Positive for fatigue.  Eyes:  Negative for blurred vision.  Gastrointestinal:  Negative for constipation, diarrhea and vomiting.  Genitourinary:  Positive for dysuria, frequency and vaginal discharge. Negative for flank pain, hematuria and urgency.  Psychiatric/Behavioral:  Positive for depression. The patient is not nervous/anxious.   All other systems reviewed and are negative.      Objective:   Physical Exam Vitals reviewed.  Constitutional:      General: She is not in acute distress.    Appearance: She is well-developed. She is obese.  HENT:     Head: Normocephalic and atraumatic.     Right Ear: Tympanic membrane normal.     Left Ear: Tympanic membrane normal.  Eyes:     Pupils: Pupils are equal, round, and reactive to light.  Neck:     Thyroid: No thyromegaly.  Cardiovascular:     Rate and Rhythm: Normal rate and regular rhythm.     Heart sounds: Normal heart sounds. No murmur heard. Pulmonary:     Effort: Pulmonary effort is normal. No respiratory distress.     Breath sounds: Normal breath sounds. No wheezing.  Abdominal:     General: Bowel sounds are normal. There is no distension.     Palpations: Abdomen is soft.     Tenderness: There is no abdominal tenderness.  Musculoskeletal:        General: No tenderness. Normal range of motion.     Cervical back: Normal range of motion and neck supple.  Skin:  General: Skin is warm and dry.  Neurological:     Mental Status: She is alert and oriented to person, place, and time.     Cranial Nerves: No cranial nerve deficit.     Deep Tendon Reflexes: Reflexes are normal and symmetric.  Psychiatric:        Behavior: Behavior normal.        Thought Content: Thought content normal.        Judgment: Judgment normal.      BP (!) 130/91   Pulse 75   Temp 97.7 F (36.5  C) (Temporal)   Ht '5\' 3"'$  (1.6 m)   Wt 225 lb 9.6 oz (102.3 kg)   SpO2 98%   BMI 39.96 kg/m       Assessment & Plan:  ODILIA DAMICO comes in today with chief complaint of Medical Management of Chronic Issues and Dysuria (Vaginal area is red with a little white discharge. Burns still when she urinates )   Diagnosis and orders addressed:  1. Annual physical exam - CMP14+EGFR - CBC with Differential/Platelet - Bayer DCA Hb A1c Waived - Lipid panel - TSH  2. Depression, major, single episode, mild ([place]) - CMP14+EGFR  3. Type 2 diabetes mellitus with other specified complication, without long-term current use of insulin ([facility]) - CMP14+EGFR  4. Down syndrome - CMP14+EGFR  5. Hypothyroidism, postradioiodine therapy - CMP14+EGFR  6. Morbid obesity with BMI of 50.0-59.9, adult ([facility]) - CMP14+EGFR  7. Vitamin D deficiency - CMP14+EGFR  8. Vagina, candidiasis Start diflucan  Keep clean and dry - CMP14+EGFR - CBC with Differential/Platelet - fluconazole (DIFLUCAN) 150 MG tablet; Take 1 tablet (150 mg total) by mouth every three (3) days as needed.  Dispense: 3 tablet; Refill: 0   Labs pending Health Maintenance reviewed Diet and exercise encouraged  Follow up plan: 3 months    Evelina Dun, FNP

## 2022-02-09 LAB — CBC WITH DIFFERENTIAL/PLATELET
Basophils Absolute: 0.1 10*3/uL (ref 0.0–0.2)
Basos: 1 %
EOS (ABSOLUTE): 0.1 10*3/uL (ref 0.0–0.4)
Eos: 1 %
Hematocrit: 40.7 % (ref 34.0–46.6)
Hemoglobin: 13.3 g/dL (ref 11.1–15.9)
Immature Grans (Abs): 0 10*3/uL (ref 0.0–0.1)
Immature Granulocytes: 0 %
Lymphocytes Absolute: 1.8 10*3/uL (ref 0.7–3.1)
Lymphs: 26 %
MCH: 31 pg (ref 26.6–33.0)
MCHC: 32.7 g/dL (ref 31.5–35.7)
MCV: 95 fL (ref 79–97)
Monocytes Absolute: 0.4 10*3/uL (ref 0.1–0.9)
Monocytes: 6 %
Neutrophils Absolute: 4.5 10*3/uL (ref 1.4–7.0)
Neutrophils: 66 %
Platelets: 341 10*3/uL (ref 150–450)
RBC: 4.29 x10E6/uL (ref 3.77–5.28)
RDW: 15.5 % — ABNORMAL HIGH (ref 11.7–15.4)
WBC: 6.8 10*3/uL (ref 3.4–10.8)

## 2022-02-09 LAB — LIPID PANEL
Chol/HDL Ratio: 3.2 ratio (ref 0.0–4.4)
Cholesterol, Total: 171 mg/dL (ref 100–199)
HDL: 53 mg/dL (ref >39)
LDL Chol Calc (NIH): 96 mg/dL (ref 0–99)
Triglycerides: 123 mg/dL (ref 0–149)
VLDL Cholesterol Cal: 22 mg/dL (ref 5–40)

## 2022-02-09 LAB — CMP14+EGFR
ALT: 38 IU/L — ABNORMAL HIGH (ref 0–32)
AST: 30 IU/L (ref 0–40)
Albumin/Globulin Ratio: 1.6 (ref 1.2–2.2)
Albumin: 3.9 g/dL (ref 3.9–4.9)
Alkaline Phosphatase: 81 IU/L (ref 44–121)
BUN/Creatinine Ratio: 9 (ref 9–23)
BUN: 7 mg/dL (ref 6–20)
Bilirubin Total: 0.4 mg/dL (ref 0.0–1.2)
CO2: 26 mmol/L (ref 20–29)
Calcium: 9.1 mg/dL (ref 8.7–10.2)
Chloride: 101 mmol/L (ref 96–106)
Creatinine, Ser: 0.79 mg/dL (ref 0.57–1.00)
Globulin, Total: 2.4 g/dL (ref 1.5–4.5)
Glucose: 93 mg/dL (ref 70–99)
Potassium: 4.3 mmol/L (ref 3.5–5.2)
Sodium: 140 mmol/L (ref 134–144)
Total Protein: 6.3 g/dL (ref 6.0–8.5)
eGFR: 98 mL/min/{1.73_m2} (ref >59)

## 2022-02-09 LAB — TSH: TSH: 0.142 u[IU]/mL — ABNORMAL LOW (ref 0.450–4.500)

## 2022-05-10 ENCOUNTER — Ambulatory Visit: Payer: 59 | Admitting: Family

## 2022-05-18 ENCOUNTER — Telehealth: Payer: 59 | Admitting: Family Medicine

## 2022-06-04 ENCOUNTER — Ambulatory Visit (INDEPENDENT_AMBULATORY_CARE_PROVIDER_SITE_OTHER): Payer: 59 | Admitting: Family

## 2022-06-04 ENCOUNTER — Encounter: Payer: Self-pay | Admitting: Family

## 2022-06-04 VITALS — BP 109/64 | HR 86 | Temp 98.1°F | Resp 20 | Ht 63.0 in | Wt 213.0 lb

## 2022-06-04 DIAGNOSIS — E1169 Type 2 diabetes mellitus with other specified complication: Secondary | ICD-10-CM | POA: Diagnosis not present

## 2022-06-04 DIAGNOSIS — Z7985 Long-term (current) use of injectable non-insulin antidiabetic drugs: Secondary | ICD-10-CM

## 2022-06-04 DIAGNOSIS — E890 Postprocedural hypothyroidism: Secondary | ICD-10-CM | POA: Diagnosis not present

## 2022-06-04 DIAGNOSIS — F320 Major depressive disorder, single episode, mild: Secondary | ICD-10-CM | POA: Diagnosis not present

## 2022-06-04 DIAGNOSIS — E559 Vitamin D deficiency, unspecified: Secondary | ICD-10-CM | POA: Diagnosis not present

## 2022-06-04 DIAGNOSIS — Q909 Down syndrome, unspecified: Secondary | ICD-10-CM | POA: Diagnosis not present

## 2022-06-04 DIAGNOSIS — K047 Periapical abscess without sinus: Secondary | ICD-10-CM | POA: Diagnosis not present

## 2022-06-04 DIAGNOSIS — Z6841 Body Mass Index (BMI) 40.0 and over, adult: Secondary | ICD-10-CM

## 2022-06-04 MED ORDER — TIRZEPATIDE 7.5 MG/0.5ML ~~LOC~~ SOAJ: 7.5000 mg | SUBCUTANEOUS | 2 refills | Status: DC

## 2022-06-04 MED ORDER — AMOXICILLIN-POT CLAVULANATE 875-125 MG PO TABS: 1.0000 | ORAL_TABLET | Freq: Two times a day (BID) | ORAL | 0 refills | Status: DC

## 2022-06-04 NOTE — Patient Instructions (Signed)
Dental Pain Dental pain is often a sign that something is wrong with your teeth or gums. It is also something that can occur following dental treatment. If you have dental pain, it is important to contact your dental care provider, especially if the cause of the pain has not been determined. Dental pain may be of varying intensity and can be caused by many things, including: Tooth decay (cavities or caries). Cavities are caused by bacteria that produce acids that irritate the nerve of your tooth, making it sensitive to air and hot or cold temperatures. This eventually causes discomfort or pain. Abscess or infection. Once the bacteria reach the inner part of the tooth (pulp), a bacterial infection (dental abscess) can occur. Pus typically collects at the end of the root of a tooth. Injury. A crack in the tooth. Gum recession exposing the root, and possibly the nerves, of a tooth. Gum (periodontal)disease. Abnormal grinding or clenching. Poor or improper home care. An unknown reason (idiopathic). Your pain may be mild or severe. It may occur when you are: Chewing. Exposed to hot or cold temperatures. Eating or drinking sugary foods or beverages, such as soda or candy. Your pain may be constant, or it may come and go without cause. Follow these instructions at home: The following actions may help to lessen any discomfort that you are feeling before or after getting dental care. Medicines Take over-the-counter and prescription medicines only as told by your dental care provider. If you were prescribed an antibiotic medicine, take it as told by your dental care provider. Do not stop taking the antibiotic even if you start to feel better. Eating and drinking Avoid foods or drinks that cause you pain, such as: Very hot or very cold foods or drinks. Sweet or sugary foods or drinks. Managing pain and swelling  Ice can sometimes be used to reduce pain and swelling, especially if the pain is  following dental treatment. If directed, put ice on the painful area of your face. To do this: Put ice in a plastic bag. Place a towel between your skin and the bag. Leave the ice on for 20 minutes, 2-3 times a day. Remove the ice if your skin turns bright red. This is very important. If you cannot feel pain, heat, or cold, you have a greater risk of damage to the area. Brushing your teeth To keep your mouth and gums healthy, brush your teeth twice a day using a fluoride toothpaste. Use a toothpaste made for sensitive teeth as directed by your dental care provider, especially if the root is exposed. Always brush your teeth with a soft-bristled toothbrush. This will help prevent irritation to your gums. General instructions Floss at least once a day. Do not apply heat to the outside of the face. Gargle with a mixture of salt and water 3-4 times a day or as needed. To make salt water, completely dissolve -1 tsp (3-6 g) of salt in 1 cup (237 mL) of warm water. Keep all follow-up visits. This is important. Contact a dental care provider if: You have any unexplained dental pain. Your pain is not controlled with medicines. Your symptoms get worse. You have new symptoms. Get help right away if: You are unable to open your mouth. You are having trouble breathing or swallowing. You have a fever. You notice that your face, neck, or jaw is swollen. These symptoms may represent a serious problem that is an emergency. Do not wait to see if the symptoms will go  away. Get medical help right away. Call your local emergency services (911 in the [place] Do not drive yourself to the hospital. Summary Dental pain may be caused by many things, including tooth decay and infection. Your pain may be mild or severe. Take over-the-counter and prescription medicines only as told by your dental care provider. Watch your dental pain for any changes. Let your dental care provider know if your symptoms get  worse. This information is not intended to replace advice given to you by your health care provider. Make sure you discuss any questions you have with your health care provider. Document Revised: 09/23/2019 Document Reviewed: 09/23/2019 Elsevier Patient Education  2024 [employer].

## 2022-06-04 NOTE — Progress Notes (Signed)
Subjective:    Patient ID: Julie Carter, female    DOB: 1985-07-04, 37 y.o.   MRN: [id number]  Chief Complaint  Patient presents with   Medical Management of Chronic Issues    Having tooth pain    PT presents to the office today for chronic follow up. She presents with mother. She has down syndrome.    She has DM and taking Ozempic 2 mg weekly. Has lost 12 lbs since our last visit.      06/04/2022   12:17 PM 02/08/2022   12:39 PM 01/19/2022    2:43 PM  Last 3 Weights  Weight (lbs) 213 lb 225 lb 9.6 oz 223 lb  Weight (kg) 96.616 kg 102.331 kg 101.152 kg     She is having a dental pain of right upper gum. She is followed by Dentist.  Diabetes She presents for her follow-up diabetic visit. She has type 2 diabetes mellitus. Associated symptoms include blurred vision and fatigue. Pertinent negatives for diabetes include no foot paresthesias. Symptoms are stable. Risk factors for coronary artery disease include dyslipidemia, diabetes mellitus, hypertension and sedentary lifestyle. She is following a generally unhealthy diet. Her overall blood glucose range is 90-110 mg/dl. Eye exam is not current.  Thyroid Problem Presents for follow-up visit. Symptoms include dry skin and fatigue. Patient reports no cold intolerance or constipation. The symptoms have been stable.  Depression        This is a chronic problem.  The current episode started more than 1 year ago.   Associated symptoms include fatigue.  Associated symptoms include no helplessness, no hopelessness and not sad.  Past treatments include SSRIs - Selective serotonin reuptake inhibitors.  Past medical history includes thyroid problem.       Review of Systems  Constitutional:  Positive for fatigue.  Eyes:  Positive for blurred vision.  Gastrointestinal:  Negative for constipation.  Endocrine: Negative for cold intolerance.  Psychiatric/Behavioral:  Positive for depression.   All other systems reviewed and are negative.       Objective:   Physical Exam Vitals reviewed.  Constitutional:      General: She is not in acute distress.    Appearance: She is well-developed. She is obese.  HENT:     Head: Normocephalic and atraumatic.     Right Ear: Tympanic membrane normal.     Left Ear: Tympanic membrane normal.     Mouth/Throat:     Dentition: Dental tenderness and dental caries present.  Eyes:     Pupils: Pupils are equal, round, and reactive to light.  Neck:     Thyroid: No thyromegaly.  Cardiovascular:     Rate and Rhythm: Normal rate and regular rhythm.     Heart sounds: Normal heart sounds. No murmur heard. Pulmonary:     Effort: Pulmonary effort is normal. No respiratory distress.     Breath sounds: Normal breath sounds. No wheezing.  Abdominal:     General: Bowel sounds are normal. There is no distension.     Palpations: Abdomen is soft.     Tenderness: There is no abdominal tenderness.  Musculoskeletal:        General: No tenderness. Normal range of motion.     Cervical back: Normal range of motion and neck supple.  Skin:    General: Skin is warm and dry.  Neurological:     Mental Status: She is alert and oriented to person, place, and time.     Cranial Nerves: No cranial  nerve deficit.     Deep Tendon Reflexes: Reflexes are normal and symmetric.  Psychiatric:        Behavior: Behavior normal.        Thought Content: Thought content normal.        Judgment: Judgment normal.       BP 109/64   Pulse 86   Temp 98.1 F (36.7 C) (Temporal)   Resp 20   Ht 5\' 3"  (1.6 m)   Wt 213 lb (96.6 kg)   SpO2 98%   BMI 37.73 kg/m      Assessment & Plan:  JAYDIE ZIEGENHAGEN comes in today with chief complaint of Medical Management of Chronic Issues (Having tooth pain/)   Diagnosis and orders addressed:  1. Type 2 diabetes mellitus with other specified complication, without long-term current use of insulin (HCC) Will change Ozempic to Mounjaro 7.5 mg  Low carb diet  Encourage weight loss  -  tirzepatide (MOUNJARO) 7.5 MG/0.5ML Pen; Inject 7.5 mg into the skin once a week.  Dispense: 6 mL; Refill: 2 - CMP14+EGFR  2. Depression, major, single episode, mild (HCC) - CMP14+EGFR  3. Down syndrome - CMP14+EGFR  4. Hypothyroidism, postradioiodine therapy - CMP14+EGFR  5. Morbid obesity with BMI of 50.0-59.9, adult (HCC) - CMP14+EGFR  6. Vitamin D deficiency - CMP14+EGFR  7. Dental infection Start Augmentin  Encouraged to make appointment to see Dental Surgeon, list given  - amoxicillin-clavulanate (AUGMENTIN) 875-125 MG tablet; Take 1 tablet by mouth 2 (two) times daily.  Dispense: 14 tablet; Refill: 0   Labs pending Health Maintenance reviewed Diet and exercise encouraged  Follow up plan: 3 months   Jannifer Rodney, FNP

## 2022-06-05 LAB — CMP14+EGFR
ALT: 31 IU/L (ref 0–32)
AST: 21 IU/L (ref 0–40)
Albumin/Globulin Ratio: 1.7 (ref 1.2–2.2)
Albumin: 4.1 g/dL (ref 3.9–4.9)
Alkaline Phosphatase: 90 IU/L (ref 44–121)
BUN/Creatinine Ratio: 12 (ref 9–23)
BUN: 8 mg/dL (ref 6–20)
Bilirubin Total: 0.3 mg/dL (ref 0.0–1.2)
CO2: 26 mmol/L (ref 20–29)
Calcium: 9.6 mg/dL (ref 8.7–10.2)
Chloride: 105 mmol/L (ref 96–106)
Creatinine, Ser: 0.66 mg/dL (ref 0.57–1.00)
Globulin, Total: 2.4 g/dL (ref 1.5–4.5)
Glucose: 120 mg/dL — ABNORMAL HIGH (ref 70–99)
Potassium: 4.1 mmol/L (ref 3.5–5.2)
Sodium: 143 mmol/L (ref 134–144)
Total Protein: 6.5 g/dL (ref 6.0–8.5)
eGFR: 115 mL/min/{1.73_m2} (ref >59)

## 2022-06-26 ENCOUNTER — Ambulatory Visit (INDEPENDENT_AMBULATORY_CARE_PROVIDER_SITE_OTHER): Payer: 59 | Admitting: Family

## 2022-06-26 ENCOUNTER — Encounter: Payer: Self-pay | Admitting: Family

## 2022-06-26 VITALS — BP 109/68 | HR 72 | Temp 98.2°F | Ht 63.0 in | Wt 209.2 lb

## 2022-06-26 DIAGNOSIS — J029 Acute pharyngitis, unspecified: Secondary | ICD-10-CM | POA: Diagnosis not present

## 2022-06-26 MED ORDER — AMOXICILLIN 500 MG PO CAPS
500.0000 mg | ORAL_CAPSULE | Freq: Two times a day (BID) | ORAL | 0 refills | Status: AC
Start: 2022-06-26 — End: 2022-07-06

## 2022-06-26 NOTE — Progress Notes (Signed)
   Subjective:    Patient ID: Julie Carter, female    DOB: 1985/02/26, 37 y.o.   MRN: [id number]  HPI    Review of Systems     Objective:   Physical Exam        Assessment & Plan:

## 2022-06-26 NOTE — Progress Notes (Signed)
Subjective:    Patient ID: Julie Carter, female    DOB: 09-14-1985, 37 y.o.   MRN: [id number]  Chief Complaint  Patient presents with   Sore Throat    X 4 days    Anal Itching    X 4 days     Sore Throat  The current episode started in the past 7 days. The problem has been unchanged. Maximum temperature: low grade. The pain is moderate. Associated symptoms include ear pain (left), headaches and trouble swallowing. Pertinent negatives include no congestion, coughing or shortness of breath. She has tried NSAIDs for the symptoms. The treatment provided mild relief.      Review of Systems  HENT:  Positive for ear pain (left) and trouble swallowing. Negative for congestion.   Respiratory:  Negative for cough and shortness of breath.   Neurological:  Positive for headaches.  All other systems reviewed and are negative.      Objective:   Physical Exam Vitals reviewed.  Constitutional:      General: She is not in acute distress.    Appearance: She is well-developed.  HENT:     Head: Atraumatic. Microcephalic.     Right Ear: There is impacted cerumen.     Left Ear: There is impacted cerumen.     Mouth/Throat:     Pharynx: Pharyngeal swelling and posterior oropharyngeal erythema present.  Eyes:     Pupils: Pupils are equal, round, and reactive to light.  Neck:     Thyroid: No thyromegaly.  Cardiovascular:     Rate and Rhythm: Normal rate and regular rhythm.     Heart sounds: Normal heart sounds. No murmur heard. Pulmonary:     Effort: Pulmonary effort is normal. No respiratory distress.     Breath sounds: Normal breath sounds. No wheezing.  Abdominal:     General: Bowel sounds are normal. There is no distension.     Palpations: Abdomen is soft.     Tenderness: There is no abdominal tenderness.  Musculoskeletal:        General: No tenderness. Normal range of motion.     Cervical back: Normal range of motion and neck supple.  Skin:    General: Skin is warm and dry.   Neurological:     Mental Status: She is alert and oriented to person, place, and time.     Cranial Nerves: No cranial nerve deficit.     Deep Tendon Reflexes: Reflexes are normal and symmetric.  Psychiatric:        Behavior: Behavior normal.        Thought Content: Thought content normal.        Judgment: Judgment normal.      BP 109/68   Pulse 72   Temp 98.2 F (36.8 C) (Temporal)   Ht 5\' 3"  (1.6 m)   Wt 209 lb 3.2 oz (94.9 kg)   BMI 37.06 kg/m       Assessment & Plan:  Julie Carter comes in today with chief complaint of Sore Throat (X 4 days/) and Anal Itching (X 4 days )   Diagnosis and orders addressed:  1. Acute pharyngitis, unspecified etiology - Take meds as prescribed - Use a cool mist humidifier  -Use saline nose sprays frequently -Force fluids -For any cough or congestion  Use plain Mucinex- regular strength or max strength is fine -For fever or aces or pains- take tylenol or ibuprofen. -Throat lozenges if help -New toothbrush in 3 days Follow up  if symptoms worsen or do not improve  - amoxicillin (AMOXIL) 500 MG capsule; Take 1 capsule (500 mg total) by mouth 2 (two) times daily for 10 days.  Dispense: 20 capsule; Refill: 0   Jannifer Rodney, FNP

## 2022-06-26 NOTE — Patient Instructions (Signed)

## 2022-07-26 ENCOUNTER — Other Ambulatory Visit: Payer: Self-pay | Admitting: Family

## 2022-07-26 DIAGNOSIS — E1169 Type 2 diabetes mellitus with other specified complication: Secondary | ICD-10-CM

## 2022-08-03 ENCOUNTER — Other Ambulatory Visit: Payer: Self-pay | Admitting: Family

## 2022-09-04 ENCOUNTER — Ambulatory Visit: Payer: 59 | Admitting: Family

## 2022-09-06 ENCOUNTER — Encounter: Payer: Self-pay | Admitting: Family

## 2022-09-11 ENCOUNTER — Ambulatory Visit (INDEPENDENT_AMBULATORY_CARE_PROVIDER_SITE_OTHER): Payer: 59

## 2022-09-11 DIAGNOSIS — E1169 Type 2 diabetes mellitus with other specified complication: Secondary | ICD-10-CM | POA: Diagnosis not present

## 2022-09-11 LAB — HM DIABETES EYE EXAM

## 2022-09-11 NOTE — Progress Notes (Signed)
Julie Carter arrived 09/11/2022 and has given verbal consent to obtain images and complete their overdue diabetic retinal screening.  The images have been sent to an ophthalmologist or optometrist for review and interpretation.  Results will be sent back to Julie Spencer, FNP for review.  Patient has been informed they will be contacted when we receive the results via telephone or MyChart

## 2022-09-18 ENCOUNTER — Other Ambulatory Visit: Payer: Self-pay | Admitting: Family

## 2022-09-18 DIAGNOSIS — E1169 Type 2 diabetes mellitus with other specified complication: Secondary | ICD-10-CM

## 2022-09-18 NOTE — Progress Notes (Signed)
Patient's mother aware and verbalized understanding.

## 2022-09-18 NOTE — Progress Notes (Signed)
Abnormal diabetic retinopathy screening with scaring and punch out lesions. Referral to Ophthalmologist  placed.

## 2022-10-26 ENCOUNTER — Other Ambulatory Visit: Payer: Self-pay | Admitting: Family

## 2022-10-26 DIAGNOSIS — E1169 Type 2 diabetes mellitus with other specified complication: Secondary | ICD-10-CM

## 2022-12-03 ENCOUNTER — Other Ambulatory Visit: Payer: Self-pay

## 2022-12-03 ENCOUNTER — Other Ambulatory Visit: Payer: Self-pay | Admitting: Family

## 2022-12-03 ENCOUNTER — Ambulatory Visit (INDEPENDENT_AMBULATORY_CARE_PROVIDER_SITE_OTHER): Payer: 59

## 2022-12-03 VITALS — Ht 63.0 in | Wt 209.0 lb

## 2022-12-03 DIAGNOSIS — Z0000 Encounter for general adult medical examination without abnormal findings: Secondary | ICD-10-CM

## 2022-12-03 DIAGNOSIS — E1169 Type 2 diabetes mellitus with other specified complication: Secondary | ICD-10-CM

## 2022-12-03 MED ORDER — SYNJARDY XR 12.5-1000 MG PO TB24: 2.0000 | ORAL_TABLET | Freq: Every day | ORAL | 0 refills | Status: DC

## 2022-12-03 NOTE — Patient Instructions (Signed)
Julie Carter , Thank you for taking time to come for your Medicare Wellness Visit. I appreciate your ongoing commitment to your health goals. Please review the following plan we discussed and let me know if I can assist you in the future.   Referrals/Orders/Follow-Ups/Clinician Recommendations: Aim for 30 minutes of exercise or brisk walking, 6-8 glasses of water, and 5 servings of fruits and vegetables each day.  This is a list of the screening recommended for you and due dates:  Health Maintenance  Topic Date Due   COVID-19 Vaccine (1) Never done   Pap with HPV screening  03/22/2018   Yearly kidney health urinalysis for diabetes  07/04/2022   Complete foot exam   07/25/2022   Hemoglobin A1C  08/09/2022   Flu Shot  04/01/2023*   Yearly kidney function blood test for diabetes  06/04/2023   Eye exam for diabetics  09/11/2023   Medicare Annual Wellness Visit  12/03/2023   DTaP/Tdap/Td vaccine (4 - Td or Tdap) 04/07/2029   Hepatitis C Screening  Completed   HIV Screening  Completed   HPV Vaccine  Aged Out  *Topic was postponed. The date shown is not the original due date.    Advanced directives: (ACP Link)Information on Advanced Care Planning can be found at [facility] Advance Health Care Directives Advance Health Care Directives ([email or web address]   Next Medicare Annual Wellness Visit scheduled for next year: Yes

## 2022-12-03 NOTE — Telephone Encounter (Signed)
Patient seen for AWV and mother is requesting refill of Synjardy.  Return appointment scheduled for 01/03/23. Appointment was missed in September

## 2022-12-03 NOTE — Progress Notes (Signed)
Subjective:   Julie Carter is a 37 y.o. female who presents for Medicare Annual (Subsequent) preventive examination.  Visit Complete: Virtual I connected with  DAVIONNE MCEUEN on 12/03/22 by a audio enabled telemedicine application and verified that I am speaking with the correct person using two identifiers.  Patient Location: Home  Provider Location: Home Office  I discussed the limitations of evaluation and management by telemedicine. The patient expressed understanding and agreed to proceed.  Vital Signs: Because this visit was a virtual/telehealth visit, some criteria may be missing or patient reported. Any vitals not documented were not able to be obtained and vitals that have been documented are patient reported.  Cardiac Risk Factors include: advanced age (>36men, >59 women);diabetes mellitus;obesity (BMI >30kg/m2);sedentary lifestyle     Objective:    Today's Vitals   12/03/22 1448  Weight: 209 lb (94.8 kg)  Height: 5\' 3"  (1.6 m)   Body mass index is 37.02 kg/m.     12/03/2022    2:54 PM 11/30/2021    2:51 PM 11/08/2020    3:51 PM 06/17/2018    2:40 PM 01/14/2015    8:02 PM 01/14/2015   12:46 PM  Advanced Directives  Does Patient Have a Medical Advance Directive? No No Unable to assess, patient is non-responsive or altered mental status No No No  Would patient like information on creating a medical advance directive? Yes (MAU/Ambulatory/Procedural Areas - Information given) No - Patient declined  No - Patient declined No - patient declined information No - patient declined information    Current Medications (verified) Outpatient Encounter Medications as of 12/03/2022  Medication Sig   ACCU-CHEK GUIDE test strip TEST UP TO 4 TIMES DAILY AS DIRECTED   Accu-Chek Softclix Lancets lancets Test BS up to 4 times daily dx E11.9   Blood Glucose Monitoring Suppl (ACCU-CHEK GUIDE ME) w/Device KIT USE UP TO FOUR TIMES DAILY AS DIRECTED.DX E11.9   DENTA 5000 PLUS 1.1 % CREA  dental cream Take by mouth as directed.   Empagliflozin-metFORMIN HCl ER (SYNJARDY XR) 12.05-998 MG TB24 Take 2 tablets by mouth daily. **NEEDS TO BE SEEN BEFORE NEXT REFILL**   escitalopram (LEXAPRO) 10 MG tablet Take 1 tablet (10 mg total) by mouth daily.   levothyroxine (SYNTHROID) 137 MCG tablet Take 1 tablet (137 mcg total) by mouth daily before breakfast.   tirzepatide (MOUNJARO) 7.5 MG/0.5ML Pen Inject 7.5 mg into the skin once a week.   triamcinolone ointment (KENALOG) 0.5 % Apply 1 application topically 2 (two) times daily.   Vitamin D, Ergocalciferol, (DRISDOL) 1.25 MG (50000 UNIT) CAPS capsule TAKE 1 CAPSULE BY MOUTH ONE TIME PER WEEK   No facility-administered encounter medications on file as of 12/03/2022.    Allergies (verified) Patient has no known allergies.   History: Past Medical History:  Diagnosis Date   Cancer (HCC)    Congenital nystagmus    Diabetes mellitus    Down's syndrome    Heart murmur    Leukemia (HCC)    Past Surgical History:  Procedure Laterality Date   HERNIA REPAIR     umbilical   Family History  Problem Relation Age of Onset   Diabetes Mother    Hypertension Mother    Hypertension Father    Social History   Socioeconomic History   Marital status: Single    Spouse name: Not on file   Number of children: 0   Years of education: 10   Highest education level: 10th grade  Occupational  History   Occupation: Disabled  Tobacco Use   Smoking status: Never   Smokeless tobacco: Never  Vaping Use   Vaping status: Never Used  Substance and Sexual Activity   Alcohol use: No    Alcohol/week: 0.0 standard drinks of alcohol   Drug use: No   Sexual activity: Never  Other Topics Concern   Not on file  Social History Narrative   Pt has Downs Syndrome and lives at home with her parents who take care of everything for her.   Social Determinants of Health   Financial Resource Strain: Low Risk  (11/30/2021)   Overall Financial Resource Strain  (CARDIA)    Difficulty of Paying Living Expenses: Not hard at all  Food Insecurity: No Food Insecurity (11/30/2021)   Hunger Vital Sign    Worried About Running Out of Food in the Last Year: Never true    Ran Out of Food in the Last Year: Never true  Transportation Needs: No Transportation Needs (11/30/2021)   PRAPARE - Administrator, Civil Service (Medical): No    Lack of Transportation (Non-Medical): No  Physical Activity: Insufficiently Active (11/30/2021)   Exercise Vital Sign    Days of Exercise per Week: 3 days    Minutes of Exercise per Session: 30 min  Stress: No Stress Concern Present (11/30/2021)   [name]    Feeling of Stress : Not at all  Social Connections: Socially Isolated (11/30/2021)   Social Connection and Isolation Panel [NHANES]    Frequency of Communication with Friends and Family: More than three times a week    Frequency of Social Gatherings with Friends and Family: More than three times a week    Attends Religious Services: Never    Database administrator or Organizations: No    Attends Engineer, structural: Never    Marital Status: Never married    Tobacco Counseling Counseling given: Not Answered   Clinical Intake:  Pre-visit preparation completed: Yes  Pain : No/denies pain     Diabetes: No  How often do you need to have someone help you when you read instructions, pamphlets, or other written materials from your doctor or pharmacy?: 3 - Sometimes  Interpreter Needed?: No  Comments: Assisted with visit by mother Rhodia Albright Information entered by :: Kandis Fantasia LPN   Activities of Daily Living    12/03/2022    2:52 PM  In your present state of health, do you have any difficulty performing the following activities:  Hearing? 0  Vision? 0  Difficulty concentrating or making decisions? 1  Walking or climbing stairs? 0  Dressing or bathing? 0   Doing errands, shopping? 1  Preparing Food and eating ? N  Using the Toilet? N  In the past six months, have you accidently leaked urine? N  Do you have problems with loss of bowel control? N  Managing your Medications? Y  Managing your Finances? Y  Housekeeping or managing your Housekeeping? Y    Patient Care Team: Junie Spencer, FNP as PCP - General (Nurse Practitioner) Isaiah Blakes, LCSW as [facility] Care Management (Licensed Clinical Social Worker)  Indicate any recent Medical Services you may have received from other than [facility] providers in the past year (date may be approximate).     Assessment:   This is a routine wellness examination for [place].  Hearing/Vision screen Hearing Screening - Comments:: Denies hearing difficulties  Vision Screening - Comments:: Diabetic eye exam done in office 09/11/22; referred to see ophthalmologist    Goals Addressed   None   Depression Screen    06/04/2022   12:19 PM 11/30/2021    2:50 PM 11/07/2021    9:41 AM 07/03/2021   10:59 AM 11/08/2020    3:33 PM 04/28/2020   11:38 AM 04/21/2020   10:43 AM  PHQ 2/9 Scores  PHQ - 2 Score 0 0 0 0 0 0 2  PHQ- 9 Score 1 0 4 0   5    Fall Risk    12/03/2022    2:52 PM 06/04/2022   12:19 PM 02/08/2022   12:38 PM 01/09/2022   11:07 AM 11/30/2021    2:49 PM  Fall Risk   Falls in the past year? 0 0 0 0 0  Number falls in past yr: 0    0  Injury with Fall? 0    0  Risk for fall due to : No Fall Risks    No Fall Risks  Follow up Falls prevention discussed;Education provided;Falls evaluation completed    Falls prevention discussed    MEDICARE RISK AT HOME: Medicare Risk at Home Any stairs in or around the home?: No If so, are there any without handrails?: No Home free of loose throw rugs in walkways, pet beds, electrical cords, etc?: Yes Adequate lighting in your home to reduce risk of falls?: Yes Life alert?: No Use of a cane, walker or w/c?: No Grab bars in the  bathroom?: Yes Shower chair or bench in shower?: No Elevated toilet seat or a handicapped toilet?: Yes  TIMED UP AND GO:  Was the test performed?  No    Cognitive Function: current diagnosis of down syndrome    12/03/2022    2:53 PM 11/08/2020    3:53 PM 06/17/2018    2:46 PM  MMSE - Mini Mental State Exam  Not completed: Unable to complete Unable to complete Unable to complete        11/30/2021    2:51 PM  6CIT Screen  What Year? 0 points  What month? 3 points  What time? 3 points  Count back from 20 0 points  Months in reverse 2 points  Repeat phrase 4 points  Total Score 12 points    Immunizations Immunization History  Administered Date(s) Administered   DTaP 04/11/1984, 09/18/1990   Influenza Split 08/14/2011   Influenza,inj,Quad PF,6+ Mos 11/02/2014, 09/22/2015, 09/23/2018, 11/07/2021   Pneumococcal Conjugate-13 03/22/2015   Pneumococcal Polysaccharide-23 01/02/2007   Tdap 04/08/2019    TDAP status: Up to date  Flu Vaccine status: Due, Education has been provided regarding the importance of this vaccine. Advised may receive this vaccine at local pharmacy or Health Dept. Aware to provide a copy of the vaccination record if obtained from local pharmacy or Health Dept. Verbalized acceptance and understanding.  Pneumococcal vaccine status: Up to date  Covid-19 vaccine status: Information provided on how to obtain vaccines.   Qualifies for Shingles Vaccine? No    Screening Tests Health Maintenance  Topic Date Due   COVID-19 Vaccine (1) Never done   Cervical Cancer Screening (HPV/Pap Cotest)  03/22/2018   Diabetic kidney evaluation - Urine ACR  07/04/2022   FOOT EXAM  07/25/2022   HEMOGLOBIN A1C  08/09/2022   INFLUENZA VACCINE  04/01/2023 (Originally 08/02/2022)   Diabetic kidney evaluation - eGFR measurement  06/04/2023   OPHTHALMOLOGY EXAM  09/11/2023   Medicare Annual Wellness (AWV)  12/03/2023   DTaP/Tdap/Td (4 - Td or Tdap) 04/07/2029   Hepatitis C  Screening  Completed   HIV Screening  Completed   HPV VACCINES  Aged Out    Health Maintenance  Health Maintenance Due  Topic Date Due   COVID-19 Vaccine (1) Never done   Cervical Cancer Screening (HPV/Pap Cotest)  03/22/2018   Diabetic kidney evaluation - Urine ACR  07/04/2022   FOOT EXAM  07/25/2022   HEMOGLOBIN A1C  08/09/2022    Lung Cancer Screening: (Low Dose CT Chest recommended if Age 10-80 years, 20 pack-year currently smoking OR have quit w/in 15years.) does not qualify.   Lung Cancer Screening Referral: n/a  Additional Screening:  Hepatitis C Screening: does qualify; Completed 04/28/20  Vision Screening: Recommended annual ophthalmology exams for early detection of glaucoma and other disorders of the eye. Is the patient up to date with their annual eye exam?  No  Who is the provider or what is the name of the office in which the patient attends annual eye exams? none If pt is not established with a provider, would they like to be referred to a provider to establish care?  Referral placed 09/18/22 .   Dental Screening: Recommended annual dental exams for proper oral hygiene  Diabetic Foot Exam: Diabetic Foot Exam: Overdue, Pt has been advised about the importance in completing this exam. Pt is scheduled for diabetic foot exam on at next office visit.  Community Resource Referral / Chronic Care Management: CRR required this visit?  No   CCM required this visit?  No     Plan:     I have personally reviewed and noted the following in the patient's chart:   Medical and social history Use of alcohol, tobacco or illicit drugs  Current medications and supplements including opioid prescriptions. Patient is not currently taking opioid prescriptions. Functional ability and status Nutritional status Physical activity Advanced directives List of other physicians Hospitalizations, surgeries, and ER visits in previous 12 months Vitals Screenings to include cognitive,  depression, and falls Referrals and appointments  In addition, I have reviewed and discussed with patient certain preventive protocols, quality metrics, and best practice recommendations. A written personalized care plan for preventive services as well as general preventive health recommendations were provided to patient.     Kandis Fantasia [place], [place]   16/01/958   After Visit Summary: (Mail) Due to this being a telephonic visit, the after visit summary with patients personalized plan was offered to patient via mail   Nurse Notes: See refill request for Synjardy.  Return appointment scheduled for 01/03/23

## 2022-12-08 ENCOUNTER — Emergency Department (HOSPITAL_COMMUNITY): Payer: 59

## 2022-12-08 ENCOUNTER — Emergency Department (HOSPITAL_COMMUNITY)
Admission: EM | Admit: 2022-12-08 | Discharge: 2022-12-08 | Disposition: A | Discharge status: Home / Self Care | Payer: 59 | Attending: Emergency Medicine | Admitting: Emergency Medicine

## 2022-12-08 ENCOUNTER — Encounter (HOSPITAL_COMMUNITY): Payer: Self-pay | Admitting: *Deleted

## 2022-12-08 ENCOUNTER — Other Ambulatory Visit: Payer: Self-pay

## 2022-12-08 DIAGNOSIS — Z7984 Long term (current) use of oral hypoglycemic drugs: Secondary | ICD-10-CM | POA: Diagnosis not present

## 2022-12-08 DIAGNOSIS — E119 Type 2 diabetes mellitus without complications: Secondary | ICD-10-CM | POA: Insufficient documentation

## 2022-12-08 DIAGNOSIS — R58 Hemorrhage, not elsewhere classified: Secondary | ICD-10-CM | POA: Diagnosis not present

## 2022-12-08 DIAGNOSIS — S0081XA Abrasion of other part of head, initial encounter: Secondary | ICD-10-CM | POA: Insufficient documentation

## 2022-12-08 DIAGNOSIS — M79602 Pain in left arm: Secondary | ICD-10-CM | POA: Diagnosis not present

## 2022-12-08 DIAGNOSIS — M25522 Pain in left elbow: Secondary | ICD-10-CM | POA: Diagnosis not present

## 2022-12-08 DIAGNOSIS — Y92410 Unspecified street and highway as the place of occurrence of the external cause: Secondary | ICD-10-CM | POA: Insufficient documentation

## 2022-12-08 DIAGNOSIS — M79632 Pain in left forearm: Secondary | ICD-10-CM | POA: Diagnosis not present

## 2022-12-08 DIAGNOSIS — R519 Headache, unspecified: Secondary | ICD-10-CM | POA: Diagnosis not present

## 2022-12-08 DIAGNOSIS — S299XXA Unspecified injury of thorax, initial encounter: Secondary | ICD-10-CM | POA: Insufficient documentation

## 2022-12-08 DIAGNOSIS — Z794 Long term (current) use of insulin: Secondary | ICD-10-CM | POA: Insufficient documentation

## 2022-12-08 DIAGNOSIS — I959 Hypotension, unspecified: Secondary | ICD-10-CM | POA: Diagnosis not present

## 2022-12-08 DIAGNOSIS — S3991XA Unspecified injury of abdomen, initial encounter: Secondary | ICD-10-CM | POA: Insufficient documentation

## 2022-12-08 DIAGNOSIS — M542 Cervicalgia: Secondary | ICD-10-CM | POA: Diagnosis not present

## 2022-12-08 DIAGNOSIS — S0990XA Unspecified injury of head, initial encounter: Secondary | ICD-10-CM | POA: Diagnosis present

## 2022-12-08 DIAGNOSIS — M79642 Pain in left hand: Secondary | ICD-10-CM | POA: Diagnosis not present

## 2022-12-08 DIAGNOSIS — Z041 Encounter for examination and observation following transport accident: Secondary | ICD-10-CM | POA: Diagnosis not present

## 2022-12-08 LAB — SAMPLE TO BLOOD BANK

## 2022-12-08 LAB — I-STAT CHEM 8, ED
BUN: 10 mg/dL (ref 6–20)
Calcium, Ion: 1.12 mmol/L — ABNORMAL LOW (ref 1.15–1.40)
Chloride: 102 mmol/L (ref 98–111)
Creatinine, Ser: 0.7 mg/dL (ref 0.44–1.00)
Glucose, Bld: 103 mg/dL — ABNORMAL HIGH (ref 70–99)
HCT: 45 % (ref 36.0–46.0)
Hemoglobin: 15.3 g/dL — ABNORMAL HIGH (ref 12.0–15.0)
Potassium: 3.6 mmol/L (ref 3.5–5.1)
Sodium: 138 mmol/L (ref 135–145)
TCO2: 25 mmol/L (ref 22–32)

## 2022-12-08 LAB — COMPREHENSIVE METABOLIC PANEL
ALT: 26 U/L (ref 0–44)
AST: 30 U/L (ref 15–41)
Albumin: 3.6 g/dL (ref 3.5–5.0)
Alkaline Phosphatase: 78 U/L (ref 38–126)
Anion gap: 12 (ref 5–15)
BUN: 9 mg/dL (ref 6–20)
CO2: 21 mmol/L — ABNORMAL LOW (ref 22–32)
Calcium: 9.1 mg/dL (ref 8.9–10.3)
Chloride: 104 mmol/L (ref 98–111)
Creatinine, Ser: 0.79 mg/dL (ref 0.44–1.00)
GFR, Estimated: >60 mL/min (ref >60)
Glucose, Bld: 104 mg/dL — ABNORMAL HIGH (ref 70–99)
Potassium: 3.9 mmol/L (ref 3.5–5.1)
Sodium: 137 mmol/L (ref 135–145)
Total Bilirubin: 0.6 mg/dL (ref <1.2)
Total Protein: 6.4 g/dL — ABNORMAL LOW (ref 6.5–8.1)

## 2022-12-08 LAB — CBC
HCT: 45.7 % (ref 36.0–46.0)
Hemoglobin: 14.7 g/dL (ref 12.0–15.0)
MCH: 31.3 pg (ref 26.0–34.0)
MCHC: 32.2 g/dL (ref 30.0–36.0)
MCV: 97.4 fL (ref 80.0–100.0)
Platelets: 288 10*3/uL (ref 150–400)
RBC: 4.69 MIL/uL (ref 3.87–5.11)
RDW: 14.6 % (ref 11.5–15.5)
WBC: 9.3 10*3/uL (ref 4.0–10.5)
nRBC: 0 % (ref 0.0–0.2)

## 2022-12-08 LAB — HCG, SERUM, QUALITATIVE: Preg, Serum: NEGATIVE

## 2022-12-08 LAB — PROTIME-INR
INR: 1 (ref 0.8–1.2)
Prothrombin Time: 13.2 s (ref 11.4–15.2)

## 2022-12-08 LAB — I-STAT CG4 LACTIC ACID, ED: Lactic Acid, Venous: 1.6 mmol/L (ref 0.5–1.9)

## 2022-12-08 MED ORDER — SODIUM CHLORIDE 0.9% FLUSH: 3.0000 mL | INTRAVENOUS | Status: DC | PRN

## 2022-12-08 MED ORDER — SODIUM CHLORIDE 0.9% FLUSH
3.0000 mL | Freq: Two times a day (BID) | INTRAVENOUS | Status: DC
Start: 2022-12-08 — End: 2022-12-08

## 2022-12-08 MED ORDER — IOHEXOL 350 MG/ML SOLN
75.0000 mL | Freq: Once | INTRAVENOUS | Status: AC | PRN
  Administered 2022-12-08: 75 mL via INTRAVENOUS

## 2022-12-08 MED ORDER — SODIUM CHLORIDE 0.9 % IV SOLN: 250.0000 mL | INTRAVENOUS | Status: DC | PRN
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
## 2022-12-08 NOTE — Discharge Instructions (Signed)
Your x-rays and CT scans did not show any signs of broken bones or internal injuries.  Apply antibiotic ointment to the abrasion to the face.  Take over-the-counter medications as needed for pain.
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
## 2022-12-22 ENCOUNTER — Other Ambulatory Visit: Payer: Self-pay | Admitting: Family

## 2022-12-23 ENCOUNTER — Other Ambulatory Visit: Payer: Self-pay | Admitting: Family

## 2022-12-23 DIAGNOSIS — F411 Generalized anxiety disorder: Secondary | ICD-10-CM

## 2022-12-24 ENCOUNTER — Telehealth: Payer: Self-pay

## 2022-12-24 NOTE — Progress Notes (Signed)
Transition Care Management Follow-up Telephone Call Date of discharge and from where: 12/08/2022 [facility] How have you been since you were released from the hospital? Patient's mother stated she is feeling much better. Any questions or concerns? No  Items Reviewed: Did the pt receive and understand the discharge instructions provided? Yes  Medications obtained and verified?  No medication prescribed.   Other? No  Any new allergies since your discharge? No  Dietary orders reviewed? Yes Do you have support at home? Yes   Follow up appointments reviewed:  PCP Hospital f/u appt confirmed? Yes  Scheduled to see Christy A. Lendon Colonel, FNP on 01/03/2023 @ [facility]. Specialist Hospital f/u appt confirmed? No  Scheduled to see  on  @ . Are transportation arrangements needed? No  If their condition worsens, is the pt aware to call PCP or go to the Emergency Dept.? Yes Was the patient provided with contact information for the PCP's office or ED? Yes Was to pt encouraged to call back with questions or concerns? Yes   Carmelle Bamberg [facility]  [facility], [facility] Guide Direct Dial: [phone]  Website: [email or web address]

## 2023-01-03 ENCOUNTER — Ambulatory Visit: Payer: 59 | Admitting: Family

## 2023-01-11 ENCOUNTER — Encounter: Payer: Self-pay | Admitting: Family

## 2023-01-11 ENCOUNTER — Ambulatory Visit: Payer: 59 | Admitting: Family

## 2023-01-11 VITALS — BP 119/81 | HR 73 | Temp 98.1°F | Ht <= 58 in | Wt 194.6 lb

## 2023-01-11 DIAGNOSIS — L250 Unspecified contact dermatitis due to cosmetics: Secondary | ICD-10-CM | POA: Diagnosis not present

## 2023-01-11 DIAGNOSIS — E559 Vitamin D deficiency, unspecified: Secondary | ICD-10-CM | POA: Diagnosis not present

## 2023-01-11 DIAGNOSIS — E1169 Type 2 diabetes mellitus with other specified complication: Secondary | ICD-10-CM | POA: Diagnosis not present

## 2023-01-11 DIAGNOSIS — Z6841 Body Mass Index (BMI) 40.0 and over, adult: Secondary | ICD-10-CM

## 2023-01-11 DIAGNOSIS — Z7985 Long-term (current) use of injectable non-insulin antidiabetic drugs: Secondary | ICD-10-CM

## 2023-01-11 DIAGNOSIS — Z0001 Encounter for general adult medical examination with abnormal findings: Secondary | ICD-10-CM | POA: Diagnosis not present

## 2023-01-11 DIAGNOSIS — E890 Postprocedural hypothyroidism: Secondary | ICD-10-CM | POA: Diagnosis not present

## 2023-01-11 DIAGNOSIS — Z0000 Encounter for general adult medical examination without abnormal findings: Secondary | ICD-10-CM

## 2023-01-11 DIAGNOSIS — F411 Generalized anxiety disorder: Secondary | ICD-10-CM

## 2023-01-11 DIAGNOSIS — Q909 Down syndrome, unspecified: Secondary | ICD-10-CM

## 2023-01-11 DIAGNOSIS — Z23 Encounter for immunization: Secondary | ICD-10-CM

## 2023-01-11 DIAGNOSIS — F320 Major depressive disorder, single episode, mild: Secondary | ICD-10-CM

## 2023-01-11 LAB — BAYER DCA HB A1C WAIVED: HB A1C (BAYER DCA - WAIVED): 5.1 % (ref 4.8–5.6)

## 2023-01-11 LAB — LIPID PANEL

## 2023-01-11 MED ORDER — TIRZEPATIDE 10 MG/0.5ML ~~LOC~~ SOAJ: 10.0000 mg | SUBCUTANEOUS | 2 refills | Status: DC

## 2023-01-11 MED ORDER — ESCITALOPRAM OXALATE 10 MG PO TABS
10.0000 mg | ORAL_TABLET | Freq: Every day | ORAL | 1 refills | Status: DC
Start: 2023-01-11 — End: 2023-07-08

## 2023-01-11 MED ORDER — SYNJARDY XR 12.5-1000 MG PO TB24: 2.0000 | ORAL_TABLET | Freq: Every day | ORAL | 1 refills | Status: DC

## 2023-01-11 MED ORDER — VITAMIN D (ERGOCALCIFEROL) 1.25 MG (50000 UNIT) PO CAPS: ORAL_CAPSULE | ORAL | 1 refills | Status: DC

## 2023-01-11 MED ORDER — TRIAMCINOLONE ACETONIDE 0.5 % EX OINT: 1.0000 | TOPICAL_OINTMENT | Freq: Two times a day (BID) | CUTANEOUS | 0 refills | Status: AC

## 2023-01-11 MED ORDER — LEVOTHYROXINE SODIUM 137 MCG PO TABS: 137.0000 ug | ORAL_TABLET | Freq: Every day | ORAL | 1 refills | Status: DC

## 2023-01-11 NOTE — Patient Instructions (Signed)

## 2023-01-11 NOTE — Progress Notes (Signed)
 Subjective:    Patient ID: Julie Carter, female    DOB: 27-Nov-1985, 38 y.o.   MRN: [id number]  Chief Complaint  Patient presents with   Medical Management of Chronic Issues    DM 3 mth, Follow up foot exam due    PT presents to the office today for CPE and chronic follow up. She presents with mother. She has down syndrome.    She has DM and taking Ozempic  2 mg weekly. Has lost 27 lbs since our last visit.      01/11/2023    8:48 AM 12/03/2022    2:48 PM 06/26/2022    2:33 PM  Last 3 Weights  Weight (lbs) 194 lb 9.6 oz 209 lb 209 lb 3.2 oz  Weight (kg) 88.27 kg 94.802 kg 94.892 kg     Diabetes She presents for her follow-up diabetic visit. She has type 2 diabetes mellitus. Associated symptoms include blurred vision and fatigue. Pertinent negatives for diabetes include no foot paresthesias. Symptoms are stable. Risk factors for coronary artery disease include dyslipidemia, diabetes mellitus, hypertension and sedentary lifestyle. She is following a generally unhealthy diet. Her overall blood glucose range is 110-130 mg/dl. Eye exam is not current.  Thyroid  Problem Presents for follow-up visit. Symptoms include dry skin, fatigue and hair loss. Patient reports no cold intolerance or constipation. The symptoms have been stable.  Depression        This is a chronic problem.  The current episode started more than 1 year ago.   Associated symptoms include fatigue.  Associated symptoms include no helplessness, no hopelessness and not sad.  Past treatments include SSRIs - Selective serotonin reuptake inhibitors.  Past medical history includes thyroid  problem.       Review of Systems  Constitutional:  Positive for fatigue.  Eyes:  Positive for blurred vision.  Gastrointestinal:  Negative for constipation.  Endocrine: Negative for cold intolerance.  All other systems reviewed and are negative.      Objective:   Physical Exam Vitals reviewed.  Constitutional:      General: She is  not in acute distress.    Appearance: She is well-developed. She is obese.  HENT:     Head: Atraumatic. Microcephalic.     Right Ear: Tympanic membrane normal.     Left Ear: Tympanic membrane normal.     Mouth/Throat:     Dentition: Dental tenderness and dental caries present.  Eyes:     Pupils: Pupils are equal, round, and reactive to light.  Neck:     Thyroid : No thyromegaly.  Cardiovascular:     Rate and Rhythm: Normal rate and regular rhythm.     Heart sounds: Normal heart sounds. No murmur heard. Pulmonary:     Effort: Pulmonary effort is normal. No respiratory distress.     Breath sounds: Normal breath sounds. No wheezing.  Abdominal:     General: Bowel sounds are normal. There is no distension.     Palpations: Abdomen is soft.     Tenderness: There is no abdominal tenderness.  Musculoskeletal:        General: No tenderness. Normal range of motion.     Cervical back: Normal range of motion and neck supple.  Skin:    General: Skin is warm and dry.  Neurological:     Mental Status: She is alert and oriented to person, place, and time.     Cranial Nerves: No cranial nerve deficit.     Deep Tendon Reflexes: Reflexes are  normal and symmetric.  Psychiatric:        Behavior: Behavior normal.        Thought Content: Thought content normal.        Judgment: Judgment normal.       BP 119/81   Pulse 73   Temp 98.1 F (36.7 C) (Temporal)   Ht 4' 9 (1.448 m)   Wt 194 lb 9.6 oz (88.3 kg)   SpO2 100%   BMI 42.11 kg/m      Assessment & Plan:  Julie Carter comes in today with chief complaint of Medical Management of Chronic Issues (DM 3 mth, Follow up foot exam due )   Diagnosis and orders addressed:  1. Type 2 diabetes mellitus with other specified complication, without long-term current use of insulin  (HCC) Will increase Mounjaro  to 10 mg from 7.5 mg  Low carb - CBC with Differential/Platelet - CMP14+EGFR - Bayer DCA Hb A1c Waived - Empagliflozin -metFORMIN   HCl ER (SYNJARDY  XR) 12.05-998 MG TB24; Take 2 tablets by mouth daily.  Dispense: 90 tablet; Refill: 1 - Microalbumin / creatinine urine ratio - tirzepatide  (MOUNJARO ) 10 MG/0.5ML Pen; Inject 10 mg into the skin once a week.  Dispense: 6 mL; Refill: 2  2. GAD (generalized anxiety disorder) - CBC with Differential/Platelet - CMP14+EGFR - escitalopram  (LEXAPRO ) 10 MG tablet; Take 1 tablet (10 mg total) by mouth daily.  Dispense: 90 tablet; Refill: 1  3. Vitamin D  deficiency  - CBC with Differential/Platelet - CMP14+EGFR - Vitamin D , Ergocalciferol , (DRISDOL ) 1.25 MG (50000 UNIT) CAPS capsule; TAKE 1 CAPSULE BY MOUTH ONE TIME PER WEEK  Dispense: 12 capsule; Refill: 1  4. Contact dermatitis due to cosmetics, unspecified contact dermatitis type - CBC with Differential/Platelet - CMP14+EGFR - triamcinolone  ointment (KENALOG ) 0.5 %; Apply 1 Application topically 2 (two) times daily.  Dispense: 30 g; Refill: 0  5. Annual physical exam (Primary) - CBC with Differential/Platelet - CMP14+EGFR - Lipid panel - Bayer DCA Hb A1c Waived - TSH  6. Hypothyroidism, postradioiodine therapy - CBC with Differential/Platelet - CMP14+EGFR - TSH  7. Down syndrome - CBC with Differential/Platelet - CMP14+EGFR  8. Morbid obesity (HCC) - CBC with Differential/Platelet - CMP14+EGFR  9. Depression, major, single episode, mild (HCC) - CBC with Differential/Platelet - CMP14+EGFR   Labs pending Mounjaro  increased to 10 mg from 7.5 mg Health Maintenance reviewed Diet and exercise encouraged  Follow up plan: 3 months   Bari Learn, FNP

## 2023-01-12 LAB — LIPID PANEL
Cholesterol, Total: 192 mg/dL (ref 100–199)
HDL: 54 mg/dL (ref >39)
LDL CALC COMMENT:: 3.6 ratio (ref 0.0–4.4)
LDL Chol Calc (NIH): 113 mg/dL — ABNORMAL HIGH (ref 0–99)
Triglycerides: 139 mg/dL (ref 0–149)
VLDL Cholesterol Cal: 25 mg/dL (ref 5–40)

## 2023-01-12 LAB — CMP14+EGFR
ALT: 18 IU/L (ref 0–32)
AST: 17 IU/L (ref 0–40)
Albumin: 4.2 g/dL (ref 3.9–4.9)
Alkaline Phosphatase: 106 IU/L (ref 44–121)
BUN/Creatinine Ratio: 15 (ref 9–23)
BUN: 11 mg/dL (ref 6–20)
Bilirubin Total: 0.4 mg/dL (ref 0.0–1.2)
CO2: 24 mmol/L (ref 20–29)
Calcium: 9.6 mg/dL (ref 8.7–10.2)
Chloride: 102 mmol/L (ref 96–106)
Creatinine, Ser: 0.74 mg/dL (ref 0.57–1.00)
Globulin, Total: 2.6 g/dL (ref 1.5–4.5)
Glucose: 77 mg/dL (ref 70–99)
Potassium: 4.3 mmol/L (ref 3.5–5.2)
Sodium: 144 mmol/L (ref 134–144)
Total Protein: 6.8 g/dL (ref 6.0–8.5)
eGFR: 106 mL/min/{1.73_m2} (ref >59)

## 2023-01-12 LAB — CBC WITH DIFFERENTIAL/PLATELET
Basophils Absolute: 0.1 10*3/uL (ref 0.0–0.2)
Basos: 1 %
EOS (ABSOLUTE): 0.1 10*3/uL (ref 0.0–0.4)
Eos: 1 %
Hematocrit: 43.8 % (ref 34.0–46.6)
Hemoglobin: 14 g/dL (ref 11.1–15.9)
Immature Grans (Abs): 0 10*3/uL (ref 0.0–0.1)
Immature Granulocytes: 0 %
Lymphocytes Absolute: 1.7 10*3/uL (ref 0.7–3.1)
Lymphs: 23 %
MCH: 30.9 pg (ref 26.6–33.0)
MCHC: 32 g/dL (ref 31.5–35.7)
MCV: 97 fL (ref 79–97)
Monocytes Absolute: 0.5 10*3/uL (ref 0.1–0.9)
Monocytes: 7 %
Neutrophils Absolute: 5.1 10*3/uL (ref 1.4–7.0)
Neutrophils: 68 %
Platelets: 374 10*3/uL (ref 150–450)
RBC: 4.53 x10E6/uL (ref 3.77–5.28)
RDW: 15 % (ref 11.7–15.4)
WBC: 7.5 10*3/uL (ref 3.4–10.8)

## 2023-01-12 LAB — TSH: TSH: 0.051 u[IU]/mL — ABNORMAL LOW (ref 0.450–4.500)

## 2023-01-13 LAB — MICROALBUMIN / CREATININE URINE RATIO
Creatinine, Urine: 134.4 mg/dL
Microalb/Creat Ratio: 18 mg/g{creat} (ref 0–29)
Microalbumin, Urine: 24.2 ug/mL

## 2023-01-14 ENCOUNTER — Other Ambulatory Visit: Payer: Self-pay | Admitting: Family

## 2023-01-14 MED ORDER — LEVOTHYROXINE SODIUM 125 MCG PO TABS: 125.0000 ug | ORAL_TABLET | Freq: Every day | ORAL | 1 refills | Status: DC

## 2023-02-06 ENCOUNTER — Ambulatory Visit (INDEPENDENT_AMBULATORY_CARE_PROVIDER_SITE_OTHER): Payer: 59 | Admitting: Podiatry

## 2023-02-06 ENCOUNTER — Encounter: Payer: Self-pay | Admitting: Podiatry

## 2023-02-06 DIAGNOSIS — M79674 Pain in right toe(s): Secondary | ICD-10-CM | POA: Diagnosis not present

## 2023-02-06 DIAGNOSIS — B351 Tinea unguium: Secondary | ICD-10-CM | POA: Diagnosis not present

## 2023-02-06 DIAGNOSIS — M79675 Pain in left toe(s): Secondary | ICD-10-CM

## 2023-02-06 DIAGNOSIS — E1169 Type 2 diabetes mellitus with other specified complication: Secondary | ICD-10-CM | POA: Diagnosis not present

## 2023-02-06 NOTE — Progress Notes (Signed)
 This patient returns to my office for at risk foot care.  This patient requires this care by a professional since this patient will be at risk due to having diabetes.  This patient is unable to cut nails herself since the patient cannot reach her nails.These nails are painful walking and wearing shoes. She is brought to the office by a Caregiver. This patient presents for at risk foot care today.  General Appearance  Alert, conversant and in no acute stress.  Vascular  Dorsalis pedis and posterior tibial  pulses are palpable  bilaterally.  Capillary return is within normal limits  bilaterally. Temperature is within normal limits  bilaterally.  Neurologic  Senn-Weinstein monofilament wire test within normal limits  bilaterally. Muscle power within normal limits bilaterally.  Nails Thick disfigured discolored nails with subungual debris  from hallux to fifth toes bilaterally. No evidence of bacterial infection or drainage bilaterally.  Orthopedic  No limitations of motion  feet .  No crepitus or effusions noted.  No bony pathology or digital deformities noted.  Skin  normotropic skin with no porokeratosis noted bilaterally.  No signs of infections or ulcers noted.     Onychomycosis  Pain in right toes  Pain in left toes  Consent was obtained for treatment procedures.   Mechanical debridement of nails 1-5  bilaterally performed with a nail nipper.  Filed with dremel without incident.    Return office visit  3 months                    Told patient to return for periodic foot care and evaluation due to potential at risk complications.   Cordella Bold DPM

## 2023-04-11 ENCOUNTER — Encounter: Payer: Self-pay | Admitting: Family

## 2023-04-11 ENCOUNTER — Ambulatory Visit: Payer: 59 | Admitting: Family

## 2023-04-11 VITALS — BP 106/72 | HR 86 | Temp 98.0°F | Ht <= 58 in | Wt 198.0 lb

## 2023-04-11 DIAGNOSIS — E559 Vitamin D deficiency, unspecified: Secondary | ICD-10-CM

## 2023-04-11 DIAGNOSIS — Z7985 Long-term (current) use of injectable non-insulin antidiabetic drugs: Secondary | ICD-10-CM

## 2023-04-11 DIAGNOSIS — E890 Postprocedural hypothyroidism: Secondary | ICD-10-CM

## 2023-04-11 DIAGNOSIS — Q909 Down syndrome, unspecified: Secondary | ICD-10-CM | POA: Diagnosis not present

## 2023-04-11 DIAGNOSIS — Z6841 Body Mass Index (BMI) 40.0 and over, adult: Secondary | ICD-10-CM

## 2023-04-11 DIAGNOSIS — E1169 Type 2 diabetes mellitus with other specified complication: Secondary | ICD-10-CM | POA: Diagnosis not present

## 2023-04-11 DIAGNOSIS — F320 Major depressive disorder, single episode, mild: Secondary | ICD-10-CM

## 2023-04-11 LAB — BAYER DCA HB A1C WAIVED: HB A1C (BAYER DCA - WAIVED): 4.9 % (ref 4.8–5.6)

## 2023-04-11 MED ORDER — TIRZEPATIDE 12.5 MG/0.5ML ~~LOC~~ SOAJ: 12.5000 mg | SUBCUTANEOUS | 3 refills | Status: DC

## 2023-04-11 NOTE — Patient Instructions (Signed)

## 2023-04-11 NOTE — Progress Notes (Signed)
 Subjective:    Patient ID: Julie Carter, female    DOB: May 09, 1985, 38 y.o.   MRN: [id number]  Chief Complaint  Patient presents with   Medical Management of Chronic Issues   PT presents to the office today for chronic follow up. She presents with mother. She has down syndrome.    She has DM and taking Mounjaro 10 mg. Has lost 45 lbs total. Her starting weight was 243 lb.      04/11/2023    1:43 PM 01/11/2023    8:48 AM 12/03/2022    2:48 PM  Last 3 Weights  Weight (lbs) 198 lb 194 lb 9.6 oz 209 lb  Weight (kg) 89.812 kg 88.27 kg 94.802 kg     Diabetes She presents for her follow-up diabetic visit. She has type 2 diabetes mellitus. Associated symptoms include blurred vision and fatigue. Pertinent negatives for diabetes include no foot paresthesias. Symptoms are stable. Risk factors for coronary artery disease include dyslipidemia, diabetes mellitus, hypertension and sedentary lifestyle. She is following a generally unhealthy diet. Her overall blood glucose range is 110-130 mg/dl. Eye exam is not current.  Thyroid Problem Presents for follow-up visit. Symptoms include dry skin, fatigue and hair loss. Patient reports no cold intolerance or constipation. The symptoms have been stable.  Depression        This is a chronic problem.  The current episode started more than 1 year ago.   Associated symptoms include fatigue and sad.  Associated symptoms include no helplessness and no hopelessness.  Past treatments include SSRIs - Selective serotonin reuptake inhibitors.  Past medical history includes thyroid problem.       Review of Systems  Constitutional:  Positive for fatigue.  Eyes:  Positive for blurred vision.  Gastrointestinal:  Negative for constipation.  Endocrine: Negative for cold intolerance.  All other systems reviewed and are negative.      Objective:   Physical Exam Vitals reviewed.  Constitutional:      General: She is not in acute distress.    Appearance: She is  well-developed. She is obese.  HENT:     Head: Atraumatic. Microcephalic.     Right Ear: There is impacted cerumen.     Left Ear: There is impacted cerumen.     Mouth/Throat:     Dentition: Dental tenderness and dental caries present.  Eyes:     Pupils: Pupils are equal, round, and reactive to light.  Neck:     Thyroid: No thyromegaly.  Cardiovascular:     Rate and Rhythm: Normal rate and regular rhythm.     Heart sounds: Normal heart sounds. No murmur heard. Pulmonary:     Effort: Pulmonary effort is normal. No respiratory distress.     Breath sounds: Normal breath sounds. No wheezing.  Abdominal:     General: Bowel sounds are normal. There is no distension.     Palpations: Abdomen is soft.     Tenderness: There is no abdominal tenderness.  Musculoskeletal:        General: No tenderness. Normal range of motion.     Cervical back: Normal range of motion and neck supple.  Skin:    General: Skin is warm and dry.  Neurological:     Mental Status: She is alert and oriented to person, place, and time.     Cranial Nerves: No cranial nerve deficit.     Deep Tendon Reflexes: Reflexes are normal and symmetric.  Psychiatric:  Behavior: Behavior normal.        Thought Content: Thought content normal.        Judgment: Judgment normal.       BP 106/72   Pulse 86   Temp 98 F (36.7 C) (Temporal)   Ht 4\' 9"  (1.448 m)   Wt 198 lb (89.8 kg)   BMI 42.85 kg/m      Assessment & Plan:  Julie Carter comes in today with chief complaint of Medical Management of Chronic Issues   Diagnosis and orders addressed:  1. Hypothyroidism, postradioiodine therapy (Primary) - CMP14+EGFR - TSH  2. Type 2 diabetes mellitus with other specified complication, without long-term current use of insulin (HCC) Will increase Mounjaro 12.5 mg from 10 mg  Low carb diet  - Bayer DCA Hb A1c Waived - CMP14+EGFR - tirzepatide (MOUNJARO) 12.5 MG/0.5ML Pen; Inject 12.5 mg into the skin once a  week.  Dispense: 6 mL; Refill: 3  3. Down syndrome - CMP14+EGFR  4. Vitamin D deficiency - CMP14+EGFR  5. Morbid obesity (HCC) - CMP14+EGFR  6. Depression, major, single episode, mild (HCC) - CMP14+EGFR   Labs pending Continue current medication  Mounjaro increased to 12.5 mg from 10 mg Health Maintenance reviewed Diet and exercise encouraged  Follow up plan: 3 months   Jannifer Rodney, FNP

## 2023-04-12 ENCOUNTER — Other Ambulatory Visit: Payer: Self-pay | Admitting: Family

## 2023-04-12 LAB — CMP14+EGFR
ALT: 20 IU/L (ref 0–32)
AST: 17 IU/L (ref 0–40)
Albumin: 4 g/dL (ref 3.9–4.9)
Alkaline Phosphatase: 85 IU/L (ref 44–121)
BUN/Creatinine Ratio: 14 (ref 9–23)
BUN: 11 mg/dL (ref 6–20)
Bilirubin Total: 0.3 mg/dL (ref 0.0–1.2)
CO2: 27 mmol/L (ref 20–29)
Calcium: 9.4 mg/dL (ref 8.7–10.2)
Chloride: 102 mmol/L (ref 96–106)
Creatinine, Ser: 0.78 mg/dL (ref 0.57–1.00)
Globulin, Total: 2.8 g/dL (ref 1.5–4.5)
Glucose: 81 mg/dL (ref 70–99)
Potassium: 4.4 mmol/L (ref 3.5–5.2)
Sodium: 142 mmol/L (ref 134–144)
Total Protein: 6.8 g/dL (ref 6.0–8.5)
eGFR: 99 mL/min/{1.73_m2} (ref >59)

## 2023-04-12 LAB — TSH: TSH: 0.033 u[IU]/mL — ABNORMAL LOW (ref 0.450–4.500)

## 2023-04-12 MED ORDER — LEVOTHYROXINE SODIUM 100 MCG PO TABS: 100.0000 ug | ORAL_TABLET | Freq: Every day | ORAL | 1 refills | Status: DC

## 2023-04-12 MED ORDER — EMPAGLIFLOZIN 25 MG PO TABS: 25.0000 mg | ORAL_TABLET | Freq: Every day | ORAL | 1 refills | Status: DC

## 2023-05-08 ENCOUNTER — Ambulatory Visit: Payer: 59 | Admitting: Podiatry

## 2023-06-13 ENCOUNTER — Ambulatory Visit (INDEPENDENT_AMBULATORY_CARE_PROVIDER_SITE_OTHER): Admitting: Family

## 2023-06-13 ENCOUNTER — Encounter: Payer: Self-pay | Admitting: Family

## 2023-06-13 VITALS — BP 100/66 | HR 88 | Temp 97.9°F | Ht <= 58 in | Wt 208.0 lb

## 2023-06-13 DIAGNOSIS — Z6841 Body Mass Index (BMI) 40.0 and over, adult: Secondary | ICD-10-CM

## 2023-06-13 DIAGNOSIS — E1169 Type 2 diabetes mellitus with other specified complication: Secondary | ICD-10-CM

## 2023-06-13 DIAGNOSIS — E890 Postprocedural hypothyroidism: Secondary | ICD-10-CM | POA: Diagnosis not present

## 2023-06-13 DIAGNOSIS — F320 Major depressive disorder, single episode, mild: Secondary | ICD-10-CM

## 2023-06-13 DIAGNOSIS — Z7985 Long-term (current) use of injectable non-insulin antidiabetic drugs: Secondary | ICD-10-CM | POA: Diagnosis not present

## 2023-06-13 DIAGNOSIS — Q909 Down syndrome, unspecified: Secondary | ICD-10-CM | POA: Diagnosis not present

## 2023-06-13 MED ORDER — TIRZEPATIDE 15 MG/0.5ML ~~LOC~~ SOAJ
15.0000 mg | SUBCUTANEOUS | 3 refills | Status: AC
Start: 2023-06-13 — End: ?

## 2023-06-13 NOTE — Progress Notes (Addendum)
 Subjective:    Patient ID: Julie Carter, female    DOB: April 11, 1985, 38 y.o.   MRN: [id number]  Chief Complaint  Patient presents with   Hypothyroidism   PT presents to the office today for chronic follow up and thyroid  follow up. She presents with mother. She has down syndrome.    She has DM and taking Mounjaro  10 mg. Has lost 45 lbs total, but has gained 10 lb since our last visit. Her starting weight was 243 lb. She is morbid obese with a BMI of 45.     06/13/2023    2:43 PM 04/11/2023    1:43 PM 01/11/2023    8:48 AM  Last 3 Weights  Weight (lbs) 208 lb 198 lb 194 lb 9.6 oz  Weight (kg) 94.348 kg 89.812 kg 88.27 kg     Diabetes She presents for her follow-up diabetic visit. She has type 2 diabetes mellitus. Associated symptoms include blurred vision and fatigue. Pertinent negatives for diabetes include no foot paresthesias. Symptoms are stable. Risk factors for coronary artery disease include dyslipidemia, diabetes mellitus, hypertension and sedentary lifestyle. She is following a generally unhealthy diet. Her overall blood glucose range is 110-130 mg/dl. Eye exam is not current.  Thyroid  Problem Presents for follow-up visit. Symptoms include dry skin, fatigue and hair loss. Patient reports no cold intolerance or constipation. The symptoms have been stable.  Depression        This is a chronic problem.  The current episode started more than 1 year ago.   Associated symptoms include fatigue and sad.  Associated symptoms include no helplessness and no hopelessness.  Past treatments include SSRIs - Selective serotonin reuptake inhibitors.  Past medical history includes thyroid  problem.       Review of Systems  Constitutional:  Positive for fatigue.  Eyes:  Positive for blurred vision.  Gastrointestinal:  Negative for constipation.  Endocrine: Negative for cold intolerance.  All other systems reviewed and are negative.      Objective:   Physical Exam Vitals reviewed.   Constitutional:      General: She is not in acute distress.    Appearance: She is well-developed. She is obese.  HENT:     Head: Atraumatic. Microcephalic.     Mouth/Throat:     Dentition: Dental tenderness and dental caries present.   Eyes:     Pupils: Pupils are equal, round, and reactive to light.   Neck:     Thyroid : No thyromegaly.   Cardiovascular:     Rate and Rhythm: Normal rate and regular rhythm.     Heart sounds: Normal heart sounds. No murmur heard. Pulmonary:     Effort: Pulmonary effort is normal. No respiratory distress.     Breath sounds: Normal breath sounds. No wheezing.  Abdominal:     General: Bowel sounds are normal. There is no distension.     Palpations: Abdomen is soft.     Tenderness: There is no abdominal tenderness.   Musculoskeletal:        General: No tenderness. Normal range of motion.     Cervical back: Normal range of motion and neck supple.   Skin:    General: Skin is warm and dry.   Neurological:     Mental Status: She is alert and oriented to person, place, and time.     Cranial Nerves: No cranial nerve deficit.     Deep Tendon Reflexes: Reflexes are normal and symmetric.   Psychiatric:  Behavior: Behavior normal.        Thought Content: Thought content normal.        Judgment: Judgment normal.       BP 100/66   Pulse 88   Temp 97.9 F (36.6 C)   Ht 4' 9 (1.448 m)   Wt 208 lb (94.3 kg)   BMI 45.01 kg/m      Assessment & Plan:  Julie Carter comes in today with chief complaint of Hypothyroidism   Diagnosis and orders addressed:  1. Hypothyroidism, postradioiodine therapy (Primary) - Thyroid  Panel With TSH  2. Type 2 diabetes mellitus with other specified complication, without long-term current use of insulin  (HCC) Will increase Mounjaro  to 15 mg from 12. 5 mg - tirzepatide  (MOUNJARO ) 15 MG/0.5ML Pen; Inject 15 mg into the skin once a week.  Dispense: 6 mL; Refill: 3  3. Down syndrome  4. Morbid obesity  with BMI of 45.0-49.9, adult (HCC)  5. Depression, major, single episode, mild (HCC)  Labs pending Continue current medication  Mounjaro  increased to 15 mg from 12.5 mg Health Maintenance reviewed Diet and exercise encouraged  Follow up plan: 3 months   Tommas Fragmin, FNP

## 2023-06-13 NOTE — Patient Instructions (Signed)

## 2023-06-14 ENCOUNTER — Ambulatory Visit: Payer: Self-pay | Admitting: Family

## 2023-06-14 LAB — THYROID PANEL WITH TSH
Free Thyroxine Index: 2.1 (ref 1.2–4.9)
T3 Uptake Ratio: 27 % (ref 24–39)
T4, Total: 7.8 ug/dL (ref 4.5–12.0)
TSH: 2.81 u[IU]/mL (ref 0.450–4.500)

## 2023-06-14 NOTE — Telephone Encounter (Signed)
 Copied from CRM [id number]. Topic: General - Other >> Jun 14, 2023  3:31 PM Julie Carter wrote: Reason for CRM: adv [employer].. thyroid  labs normal

## 2023-06-17 NOTE — Progress Notes (Signed)
 done

## 2023-07-07 ENCOUNTER — Other Ambulatory Visit: Payer: Self-pay | Admitting: Family

## 2023-07-07 DIAGNOSIS — F411 Generalized anxiety disorder: Secondary | ICD-10-CM

## 2023-07-12 ENCOUNTER — Ambulatory Visit: Admitting: Family

## 2023-07-16 ENCOUNTER — Encounter: Payer: Self-pay | Admitting: Family

## 2023-07-30 ENCOUNTER — Encounter (HOSPITAL_COMMUNITY): Payer: Self-pay

## 2023-07-30 ENCOUNTER — Emergency Department (HOSPITAL_COMMUNITY)
Admission: EM | Admit: 2023-07-30 | Discharge: 2023-07-31 | Disposition: A | Discharge status: Home / Self Care | Attending: Emergency Medicine | Admitting: Emergency Medicine

## 2023-07-30 ENCOUNTER — Other Ambulatory Visit: Payer: Self-pay

## 2023-07-30 ENCOUNTER — Emergency Department (HOSPITAL_COMMUNITY)

## 2023-07-30 DIAGNOSIS — R079 Chest pain, unspecified: Secondary | ICD-10-CM | POA: Diagnosis not present

## 2023-07-30 DIAGNOSIS — R0789 Other chest pain: Secondary | ICD-10-CM | POA: Insufficient documentation

## 2023-07-30 LAB — CBC
HCT: 42.9 % (ref 36.0–46.0)
Hemoglobin: 13.8 g/dL (ref 12.0–15.0)
MCH: 31.8 pg (ref 26.0–34.0)
MCHC: 32.2 g/dL (ref 30.0–36.0)
MCV: 98.8 fL (ref 80.0–100.0)
Platelets: 292 K/uL (ref 150–400)
RBC: 4.34 MIL/uL (ref 3.87–5.11)
RDW: 14.4 % (ref 11.5–15.5)
WBC: 10.2 K/uL (ref 4.0–10.5)
nRBC: 0 % (ref 0.0–0.2)

## 2023-07-30 LAB — BASIC METABOLIC PANEL WITH GFR
Anion gap: 10 (ref 5–15)
BUN: 12 mg/dL (ref 6–20)
CO2: 26 mmol/L (ref 22–32)
Calcium: 8.9 mg/dL (ref 8.9–10.3)
Chloride: 105 mmol/L (ref 98–111)
Creatinine, Ser: 0.85 mg/dL (ref 0.44–1.00)
GFR, Estimated: >60 mL/min (ref >60)
Glucose, Bld: 122 mg/dL — ABNORMAL HIGH (ref 70–99)
Potassium: 3.9 mmol/L (ref 3.5–5.1)
Sodium: 141 mmol/L (ref 135–145)

## 2023-07-30 LAB — TROPONIN I (HIGH SENSITIVITY)
Troponin I (High Sensitivity): 4 ng/L (ref <18)
Troponin I (High Sensitivity): 7 ng/L (ref <18)

## 2023-07-30 LAB — HCG, SERUM, QUALITATIVE: Preg, Serum: NEGATIVE

## 2023-07-30 NOTE — ED Triage Notes (Signed)
 Patient c/o chest pain since this morning that feels like a hard pain. Family also says that she breathes fast sometimes which is also new. Patient does not appear in distress in triage, VSS.

## 2023-07-31 DIAGNOSIS — R0789 Other chest pain: Secondary | ICD-10-CM | POA: Diagnosis not present

## 2023-07-31 MED ORDER — ACETAMINOPHEN 325 MG PO TABS
650.0000 mg | ORAL_TABLET | Freq: Once | ORAL | Status: AC
  Administered 2023-07-31: 650 mg via ORAL
  Filled 2023-07-31: qty 2

## 2023-07-31 MED ORDER — ALUM & MAG HYDROXIDE-SIMETH 200-200-20 MG/5ML PO SUSP
30.0000 mL | Freq: Once | ORAL | Status: AC
  Administered 2023-07-31: 30 mL via ORAL
  Filled 2023-07-31: qty 30

## 2023-07-31 MED ORDER — LIDOCAINE VISCOUS HCL 2 % MT SOLN
15.0000 mL | Freq: Once | OROMUCOSAL | Status: AC
  Administered 2023-07-31: 15 mL via ORAL
  Filled 2023-07-31: qty 15

## 2023-07-31 MED ORDER — IBUPROFEN 400 MG PO TABS
400.0000 mg | ORAL_TABLET | Freq: Once | ORAL | Status: AC
  Administered 2023-07-31: 400 mg via ORAL
  Filled 2023-07-31: qty 1

## 2023-07-31 NOTE — ED Provider Notes (Signed)
 [facility] EMERGENCY DEPARTMENT AT [facility] Provider Note   CSN: [id number] Arrival date & time: 07/30/23  1105     Patient presents with: Chest Pain   Julie Carter is a 38 y.o. female.   Brought to the department for evaluation of chest pain.  Patient reports pain in the center of her chest that she first complained about this morning.  No known history of CAD.  Patient reports that she feels like she is breathing fine.       Prior to Admission medications   Medication Sig Start Date End Date Taking? Authorizing Provider  ACCU-CHEK GUIDE test strip TEST UP TO 4 TIMES DAILY AS DIRECTED 07/11/20   [place], MD  Accu-Chek Softclix Lancets lancets Test BS up to 4 times daily dx E11.9 05/23/20   Lavell Lye A, FNP  Blood Glucose Monitoring Suppl (ACCU-CHEK GUIDE ME) w/Device KIT USE UP TO FOUR TIMES DAILY AS DIRECTED.DX E11.9 07/11/20   Lavell Lye A, FNP  DENTA 5000 PLUS 1.1 % CREA dental cream Take by mouth as directed. 07/16/19   [provider]  empagliflozin  (JARDIANCE ) 25 MG TABS tablet Take 1 tablet (25 mg total) by mouth daily before breakfast. 04/12/23   Lavell Lye A, FNP  escitalopram  (LEXAPRO ) 10 MG tablet TAKE 1 TABLET BY MOUTH EVERY DAY 07/08/23   Lavell Lye A, FNP  levothyroxine  (SYNTHROID ) 100 MCG tablet Take 1 tablet (100 mcg total) by mouth daily. 04/12/23 04/11/24  Lavell Lye LABOR, FNP  tirzepatide  (MOUNJARO ) 15 MG/0.5ML Pen Inject 15 mg into the skin once a week. 06/13/23   Lavell Lye LABOR, FNP  triamcinolone  ointment (KENALOG ) 0.5 % Apply 1 Application topically 2 (two) times daily. 01/11/23   Hawks, Lye A, FNP  Vitamin D , Ergocalciferol , (DRISDOL ) 1.25 MG (50000 UNIT) CAPS capsule TAKE 1 CAPSULE BY MOUTH ONE TIME PER WEEK 01/11/23   Hawks, Christy A, FNP    Allergies: Patient has no known allergies.    Review of Systems  Updated Vital Signs BP (!) 102/58 (BP Location: Right Arm)   Pulse 63   Temp 97.6 F (36.4 C) (Oral)    Resp 16   SpO2 99%   Physical Exam Vitals and nursing note reviewed.  Constitutional:      General: She is not in acute distress.    Appearance: She is well-developed.  HENT:     Head: Normocephalic and atraumatic.     Mouth/Throat:     Mouth: Mucous membranes are moist.  Eyes:     General: Vision grossly intact. Gaze aligned appropriately.     Extraocular Movements: Extraocular movements intact.     Conjunctiva/sclera: Conjunctivae normal.  Cardiovascular:     Rate and Rhythm: Normal rate and regular rhythm.     Pulses: Normal pulses.     Heart sounds: Normal heart sounds, S1 normal and S2 normal. No murmur heard.    No friction rub. No gallop.  Pulmonary:     Effort: Pulmonary effort is normal. No respiratory distress.     Breath sounds: Normal breath sounds.  Chest:     Chest wall: Tenderness present.    Abdominal:     General: Bowel sounds are normal.     Palpations: Abdomen is soft.     Tenderness: There is no abdominal tenderness. There is no guarding or rebound.     Hernia: No hernia is present.  Musculoskeletal:        General: No swelling.  Cervical back: Full passive range of motion without pain, normal range of motion and neck supple. No spinous process tenderness or muscular tenderness. Normal range of motion.     Right lower leg: No edema.     Left lower leg: No edema.  Skin:    General: Skin is warm and dry.     Capillary Refill: Capillary refill takes less than 2 seconds.     Findings: No ecchymosis, erythema, rash or wound.  Neurological:     General: No focal deficit present.     Mental Status: She is alert and oriented to person, place, and time.     GCS: GCS eye subscore is 4. GCS verbal subscore is 5. GCS motor subscore is 6.     Cranial Nerves: Cranial nerves 2-12 are intact.     Sensory: Sensation is intact.     Motor: Motor function is intact.     Coordination: Coordination is intact.  Psychiatric:        Attention and Perception: Attention  normal.        Mood and Affect: Mood normal.        Speech: Speech normal.        Behavior: Behavior normal.     (all labs ordered are listed, but only abnormal results are displayed) Labs Reviewed  BASIC METABOLIC PANEL WITH GFR - Abnormal; Notable for the following components:      Result Value   Glucose, Bld 122 (*)    All other components within normal limits  CBC  HCG, SERUM, QUALITATIVE  TROPONIN I (HIGH SENSITIVITY)  TROPONIN I (HIGH SENSITIVITY)    EKG: EKG Interpretation Date/Time:  Tuesday July 30 2023 11:14:14 EDT Ventricular Rate:  77 PR Interval:  126 QRS Duration:  80 QT Interval:  382 QTC Calculation: 432 R Axis:   104  Text Interpretation: Normal sinus rhythm with sinus arrhythmia Rightward axis Low voltage QRS Cannot rule out Anterior infarct , age undetermined Abnormal ECG When compared with ECG of 14-Jan-2015 12:46, No significant change since last tracing Confirmed by Haze Lonni PARAS [phone]) on 07/31/2023 2:27:15 AM  Radiology: DG Chest 2 View Result Date: 07/30/2023 CLINICAL DATA:  Chest pain EXAM: CHEST - 2 VIEW COMPARISON:  X-ray 12/08/2022 and CT FINDINGS: Under penetrated and slightly underinflated x-ray. No consolidation, pneumothorax or effusion. Normal cardiopericardial silhouette without edema. IMPRESSION: Limited x-ray.  No acute cardiopulmonary disease. Electronically Signed   By: Ranell Bring M.D.   On: 07/30/2023 13:00     Procedures   Medications Ordered in the ED  alum & mag hydroxide-simeth (MAALOX/MYLANTA) 200-200-20 MG/5ML suspension 30 mL (30 mLs Oral Given 07/31/23 0153)    And  lidocaine  (XYLOCAINE ) 2 % viscous mouth solution 15 mL (15 mLs Oral Given 07/31/23 0153)  ibuprofen  (ADVIL ) tablet 400 mg (400 mg Oral Given 07/31/23 0152)  acetaminophen  (TYLENOL ) tablet 650 mg (650 mg Oral Given 07/31/23 0153)                                    Medical Decision Making Amount and/or Complexity of Data Reviewed Labs: ordered.  Decision-making details documented in ED Course. Radiology: ordered and independent interpretation performed. Decision-making details documented in ED Course. ECG/medicine tests: ordered and independent interpretation performed. Decision-making details documented in ED Course.  Risk OTC drugs. Prescription drug management.   Differential Diagnosis considered includes, but not limited to: STEMI; NSTEMI; myocarditis; pericarditis; pulmonary  embolism; aortic dissection; pneumothorax; pneumonia; gastritis; musculoskeletal pain  Presents to the emergency [facility] for evaluation of chest pain.  Patient having pain in the center of her chest throughout the day.  No known CAD history.  Patient is PERC negative.  She appears comfortable.  There is some anterior chest tenderness.  Improved after GI cocktail, Motrin  and Tylenol .  Patient's EKG is unchanged from prior.  Troponin negative x 2.  Workup reassuring.  Patient does have cardiac risk factors, will be appropriate for outpatient management.  Referral to cardiology has been placed.  Given return precautions.      Final diagnoses:  Chest pain, unspecified type    ED Discharge Orders          Ordered    Ambulatory referral to Cardiology       Comments: If you have not heard from the Cardiology office within the next 72 hours please call [phone].   07/31/23 [id number]               Haze Lonni PARAS, MD 07/31/23 [phone]

## 2023-07-31 NOTE — ED Notes (Signed)
 Pt wheel chaired to restroom, upon sitting up, she began to have a dizzy spell that resolved with rest

## 2023-08-21 ENCOUNTER — Other Ambulatory Visit: Payer: Self-pay | Admitting: Family

## 2023-08-21 DIAGNOSIS — E559 Vitamin D deficiency, unspecified: Secondary | ICD-10-CM

## 2023-09-16 ENCOUNTER — Encounter: Payer: Self-pay | Admitting: Family

## 2023-09-16 ENCOUNTER — Ambulatory Visit: Admitting: Family

## 2023-09-16 VITALS — BP 122/75 | HR 74 | Temp 98.5°F | Ht <= 58 in | Wt 212.4 lb

## 2023-09-16 DIAGNOSIS — F320 Major depressive disorder, single episode, mild: Secondary | ICD-10-CM

## 2023-09-16 DIAGNOSIS — Z7985 Long-term (current) use of injectable non-insulin antidiabetic drugs: Secondary | ICD-10-CM | POA: Diagnosis not present

## 2023-09-16 DIAGNOSIS — Z23 Encounter for immunization: Secondary | ICD-10-CM | POA: Diagnosis not present

## 2023-09-16 DIAGNOSIS — E1169 Type 2 diabetes mellitus with other specified complication: Secondary | ICD-10-CM

## 2023-09-16 DIAGNOSIS — E890 Postprocedural hypothyroidism: Secondary | ICD-10-CM | POA: Diagnosis not present

## 2023-09-16 DIAGNOSIS — Q909 Down syndrome, unspecified: Secondary | ICD-10-CM | POA: Diagnosis not present

## 2023-09-16 DIAGNOSIS — E559 Vitamin D deficiency, unspecified: Secondary | ICD-10-CM | POA: Diagnosis not present

## 2023-09-16 LAB — BAYER DCA HB A1C WAIVED: HB A1C (BAYER DCA - WAIVED): 5.4 % (ref 4.8–5.6)

## 2023-09-16 NOTE — Progress Notes (Signed)
 Subjective:    Patient ID: Julie Carter, female    DOB: February 12, 1985, 38 y.o.   MRN: [id number]  Chief Complaint  Patient presents with   3 MONTH FOLLOW UP   PT presents to the office today for chronic follow up and thyroid  follow up. She presents with mother. She has down syndrome.    She has DM and taking Mounjaro  15 mg. Has lost 45 lbs total, but has gained 10 lb since our last visit. Her starting weight was 243 lb. She is morbid obese with a BMI of 45.     09/16/2023    2:25 PM 06/13/2023    2:43 PM 04/11/2023    1:43 PM  Last 3 Weights  Weight (lbs) 212 lb 6.4 oz 208 lb 198 lb  Weight (kg) 96.344 kg 94.348 kg 89.812 kg     Diabetes She presents for her follow-up diabetic visit. She has type 2 diabetes mellitus. Associated symptoms include blurred vision and fatigue. Pertinent negatives for diabetes include no foot paresthesias. Symptoms are stable. Risk factors for coronary artery disease include dyslipidemia, diabetes mellitus, hypertension and sedentary lifestyle. She is following a generally unhealthy diet. (Does not check glucose at home regularly ) Eye exam is not current.  Thyroid  Problem Presents for follow-up visit. Symptoms include dry skin, fatigue and hair loss. Patient reports no cold intolerance or constipation. The symptoms have been stable.  Depression        This is a chronic problem.  The current episode started more than 1 year ago.   Associated symptoms include fatigue, decreased interest and sad.  Associated symptoms include no helplessness and no hopelessness.  Past treatments include SSRIs - Selective serotonin reuptake inhibitors.  Past medical history includes thyroid  problem.       Review of Systems  Constitutional:  Positive for fatigue.  Eyes:  Positive for blurred vision.  Gastrointestinal:  Negative for constipation.  Endocrine: Negative for cold intolerance.  All other systems reviewed and are negative.  Family History  Problem Relation Age  of Onset   Diabetes Mother    Hypertension Mother    Hypertension Father    Social History   Socioeconomic History   Marital status: Single    Spouse name: Not on file   Number of children: 0   Years of education: 10   Highest education level: 10th grade  Occupational History   Occupation: Disabled  Tobacco Use   Smoking status: Never   Smokeless tobacco: Never  Vaping Use   Vaping status: Never Used  Substance and Sexual Activity   Alcohol use: No    Alcohol/week: 0.0 standard drinks of alcohol   Drug use: No   Sexual activity: Never  Other Topics Concern   Not on file  Social History Narrative   Pt has Downs Syndrome and lives at home with her parents who take care of everything for her.   Social Drivers of Corporate investment banker Strain: Low Risk  (12/03/2022)   Overall Financial Resource Strain (CARDIA)    Difficulty of Paying Living Expenses: Not hard at all  Food Insecurity: No Food Insecurity (12/03/2022)   Hunger Vital Sign    Worried About Running Out of Food in the Last Year: Never true    Ran Out of Food in the Last Year: Never true  Transportation Needs: No Transportation Needs (12/03/2022)   PRAPARE - Administrator, Civil Service (Medical): No    Lack of Transportation (  Non-Medical): No  Physical Activity: Inactive (12/03/2022)   Exercise Vital Sign    Days of Exercise per Week: 0 days    Minutes of Exercise per Session: 0 min  Stress: No Stress Concern Present (12/03/2022)   [name]    Feeling of Stress : Not at all  Social Connections: Socially Isolated (12/03/2022)   Social Connection and Isolation Panel    Frequency of Communication with Friends and Family: More than three times a week    Frequency of Social Gatherings with Friends and Family: More than three times a week    Attends Religious Services: Never    Database administrator or Organizations: No    Attends  Banker Meetings: Never    Marital Status: Never married        Objective:   Physical Exam Vitals reviewed.  Constitutional:      General: She is not in acute distress.    Appearance: She is well-developed. She is obese.  HENT:     Head: Atraumatic. Microcephalic.     Mouth/Throat:     Dentition: Dental tenderness and dental caries present.  Eyes:     Pupils: Pupils are equal, round, and reactive to light.  Neck:     Thyroid : No thyromegaly.  Cardiovascular:     Rate and Rhythm: Normal rate and regular rhythm.     Heart sounds: Normal heart sounds. No murmur heard. Pulmonary:     Effort: Pulmonary effort is normal. No respiratory distress.     Breath sounds: Normal breath sounds. No wheezing.  Abdominal:     General: Bowel sounds are normal. There is no distension.     Palpations: Abdomen is soft.     Tenderness: There is no abdominal tenderness.  Musculoskeletal:        General: No tenderness. Normal range of motion.     Cervical back: Normal range of motion and neck supple.  Skin:    General: Skin is warm and dry.  Neurological:     Mental Status: She is alert and oriented to person, place, and time.     Cranial Nerves: No cranial nerve deficit.     Deep Tendon Reflexes: Reflexes are normal and symmetric.  Psychiatric:        Mood and Affect: Affect is flat.        Behavior: Behavior normal.        Thought Content: Thought content normal.        Judgment: Judgment normal.       BP 122/75   Pulse 74   Temp 98.5 F (36.9 C)   Ht 4' 9 (1.448 m)   Wt 212 lb 6.4 oz (96.3 kg)   SpO2 98%   BMI 45.96 kg/m      Assessment & Plan:  LYVIA MONDESIR comes in today with chief complaint of 3 MONTH FOLLOW UP   Diagnosis and orders addressed:  1. Hypothyroidism, postradioiodine therapy (Primary) - CMP14+EGFR - TSH  2. Type 2 diabetes mellitus with other specified complication, without long-term current use of insulin  (HCC) - Bayer DCA Hb A1c  Waived - CMP14+EGFR  3. Down syndrome - CMP14+EGFR  4. Vitamin D  deficiency - CMP14+EGFR  5. Morbid obesity (HCC) - CMP14+EGFR  6. Depression, major, single episode, mild (HCC) - CMP14+EGFR   Labs pending Continue current medication  Continue Mounjaro  15 mg  Encouraged exercise, patient has gained weight since our last visit Health  Maintenance reviewed Diet and exercise encouraged  Follow up plan: 6 months   Bari Learn, FNP

## 2023-09-16 NOTE — Patient Instructions (Signed)

## 2023-09-17 ENCOUNTER — Ambulatory Visit: Payer: Self-pay | Admitting: Family

## 2023-09-17 LAB — CMP14+EGFR
ALT: 16 IU/L (ref 0–32)
AST: 19 IU/L (ref 0–40)
Albumin: 4 g/dL (ref 3.9–4.9)
Alkaline Phosphatase: 92 IU/L (ref 41–116)
BUN/Creatinine Ratio: 12 (ref 9–23)
BUN: 11 mg/dL (ref 6–20)
Bilirubin Total: 0.3 mg/dL (ref 0.0–1.2)
CO2: 24 mmol/L (ref 20–29)
Calcium: 9.4 mg/dL (ref 8.7–10.2)
Chloride: 101 mmol/L (ref 96–106)
Creatinine, Ser: 0.89 mg/dL (ref 0.57–1.00)
Globulin, Total: 2.8 g/dL (ref 1.5–4.5)
Glucose: 79 mg/dL (ref 70–99)
Potassium: 4.3 mmol/L (ref 3.5–5.2)
Sodium: 141 mmol/L (ref 134–144)
Total Protein: 6.8 g/dL (ref 6.0–8.5)
eGFR: 85 mL/min/1.73 (ref >59)

## 2023-09-17 LAB — TSH: TSH: 13.7 u[IU]/mL — ABNORMAL HIGH (ref 0.450–4.500)

## 2023-09-17 MED ORDER — LEVOTHYROXINE SODIUM 112 MCG PO TABS
112.0000 ug | ORAL_TABLET | Freq: Every day | ORAL | 3 refills | Status: DC
Start: 2023-09-17 — End: 2023-11-21

## 2023-10-08 ENCOUNTER — Other Ambulatory Visit: Payer: Self-pay | Admitting: Family

## 2023-10-16 ENCOUNTER — Encounter: Payer: Self-pay | Admitting: Family

## 2023-11-08 ENCOUNTER — Encounter: Payer: Self-pay | Admitting: Podiatry

## 2023-11-08 ENCOUNTER — Ambulatory Visit: Admitting: Podiatry

## 2023-11-08 DIAGNOSIS — M79674 Pain in right toe(s): Secondary | ICD-10-CM | POA: Diagnosis not present

## 2023-11-08 DIAGNOSIS — E1169 Type 2 diabetes mellitus with other specified complication: Secondary | ICD-10-CM

## 2023-11-08 DIAGNOSIS — M79675 Pain in left toe(s): Secondary | ICD-10-CM | POA: Diagnosis not present

## 2023-11-08 DIAGNOSIS — B351 Tinea unguium: Secondary | ICD-10-CM | POA: Diagnosis not present

## 2023-11-08 NOTE — Progress Notes (Signed)
 This patient returns to my office for at risk foot care.  This patient requires this care by a professional since this patient will be at risk due to having diabetes.  This patient is unable to cut nails herself since the patient cannot reach her nails.These nails are painful walking and wearing shoes. She is brought to the office by a Caregiver. This patient presents for at risk foot care today.  General Appearance  Alert, conversant and in no acute stress.  Vascular  Dorsalis pedis and posterior tibial  pulses are palpable  bilaterally.  Capillary return is within normal limits  bilaterally. Temperature is within normal limits  bilaterally.  Neurologic  Senn-Weinstein monofilament wire test within normal limits  bilaterally. Muscle power within normal limits bilaterally.  Nails Thick disfigured discolored nails with subungual debris  from hallux to fifth toes bilaterally. No evidence of bacterial infection or drainage bilaterally.  Orthopedic  No limitations of motion  feet .  No crepitus or effusions noted.  No bony pathology or digital deformities noted.  Skin  normotropic skin with no porokeratosis noted bilaterally.  No signs of infections or ulcers noted.     Onychomycosis  Pain in right toes  Pain in left toes  Consent was obtained for treatment procedures.   Mechanical debridement of nails 1-5  bilaterally performed with a nail nipper.  Filed with dremel without incident.    Return office visit  4 months                    Told patient to return for periodic foot care and evaluation due to potential at risk complications.   Cordella Bold DPM

## 2023-11-18 ENCOUNTER — Encounter: Payer: Self-pay | Admitting: Family

## 2023-11-18 ENCOUNTER — Ambulatory Visit: Payer: Self-pay | Admitting: Family

## 2023-11-18 VITALS — BP 119/73 | HR 98 | Temp 96.8°F | Ht <= 58 in | Wt 214.0 lb

## 2023-11-18 DIAGNOSIS — Z7985 Long-term (current) use of injectable non-insulin antidiabetic drugs: Secondary | ICD-10-CM

## 2023-11-18 DIAGNOSIS — Q909 Down syndrome, unspecified: Secondary | ICD-10-CM | POA: Diagnosis not present

## 2023-11-18 DIAGNOSIS — E559 Vitamin D deficiency, unspecified: Secondary | ICD-10-CM

## 2023-11-18 DIAGNOSIS — J029 Acute pharyngitis, unspecified: Secondary | ICD-10-CM

## 2023-11-18 DIAGNOSIS — E890 Postprocedural hypothyroidism: Secondary | ICD-10-CM | POA: Diagnosis not present

## 2023-11-18 DIAGNOSIS — F320 Major depressive disorder, single episode, mild: Secondary | ICD-10-CM

## 2023-11-18 DIAGNOSIS — E1169 Type 2 diabetes mellitus with other specified complication: Secondary | ICD-10-CM

## 2023-11-18 LAB — RAPID STREP SCREEN (MED CTR MEBANE ONLY): Strep Gp A Ag, IA W/Reflex: NEGATIVE

## 2023-11-18 LAB — CULTURE, GROUP A STREP

## 2023-11-18 NOTE — Progress Notes (Signed)
 Subjective:    Patient ID: Julie Carter, female    DOB: 14-Nov-1985, 38 y.o.   MRN: [id number]  Chief Complaint  Patient presents with   Hypothyroidism   PT presents to the office today for chronic follow up and thyroid  follow up. She presents with mother. She has down syndrome.    She has DM and taking Mounjaro  15 mg. Has lost 45 lbs total, but has gained 12 lb since our last visit. Her starting weight was 243 lb. She is morbid obese with a BMI of 46.     11/18/2023   11:28 AM 09/16/2023    2:25 PM 06/13/2023    2:43 PM  Last 3 Weights  Weight (lbs) 214 lb 212 lb 6.4 oz 208 lb  Weight (kg) 97.07 kg 96.344 kg 94.348 kg     Diabetes She presents for her follow-up diabetic visit. She has type 2 diabetes mellitus. Associated symptoms include blurred vision and fatigue. Pertinent negatives for diabetes include no foot paresthesias. Symptoms are stable. Risk factors for coronary artery disease include dyslipidemia, diabetes mellitus, hypertension, sedentary lifestyle and obesity. She is following a generally unhealthy diet. (Does not check glucose at home regularly ) Eye exam is not current.  Thyroid  Problem Presents for follow-up visit. Symptoms include dry skin, fatigue and hair loss. Patient reports no cold intolerance or constipation. The symptoms have been stable.  Depression        This is a chronic problem.  The current episode started more than 1 year ago.   Associated symptoms include fatigue and decreased interest.  Associated symptoms include no helplessness, no hopelessness and not sad.  Past treatments include SSRIs - Selective serotonin reuptake inhibitors.  Past medical history includes thyroid  problem.   Sore Throat  This is a new problem. The current episode started today. The problem has been unchanged. The pain is mild.      Review of Systems  Constitutional:  Positive for fatigue.  Eyes:  Positive for blurred vision.  Gastrointestinal:  Negative for constipation.   Endocrine: Negative for cold intolerance.  All other systems reviewed and are negative.  Family History  Problem Relation Age of Onset   Diabetes Mother    Hypertension Mother    Hypertension Father    Social History   Socioeconomic History   Marital status: Single    Spouse name: Not on file   Number of children: 0   Years of education: 10   Highest education level: 10th grade  Occupational History   Occupation: Disabled  Tobacco Use   Smoking status: Never   Smokeless tobacco: Never  Vaping Use   Vaping status: Never Used  Substance and Sexual Activity   Alcohol use: No    Alcohol/week: 0.0 standard drinks of alcohol   Drug use: No   Sexual activity: Never  Other Topics Concern   Not on file  Social History Narrative   Pt has Downs Syndrome and lives at home with her parents who take care of everything for her.   Social Drivers of Corporate Investment Banker Strain: Low Risk  (12/03/2022)   Overall Financial Resource Strain (CARDIA)    Difficulty of Paying Living Expenses: Not hard at all  Food Insecurity: No Food Insecurity (12/03/2022)   Hunger Vital Sign    Worried About Running Out of Food in the Last Year: Never true    Ran Out of Food in the Last Year: Never true  Transportation Needs: No Transportation  Needs (12/03/2022)   PRAPARE - Administrator, Civil Service (Medical): No    Lack of Transportation (Non-Medical): No  Physical Activity: Inactive (12/03/2022)   Exercise Vital Sign    Days of Exercise per Week: 0 days    Minutes of Exercise per Session: 0 min  Stress: No Stress Concern Present (12/03/2022)   [name]    Feeling of Stress : Not at all  Social Connections: Socially Isolated (12/03/2022)   Social Connection and Isolation Panel    Frequency of Communication with Friends and Family: More than three times a week    Frequency of Social Gatherings with Friends and  Family: More than three times a week    Attends Religious Services: Never    Database Administrator or Organizations: No    Attends Banker Meetings: Never    Marital Status: Never married        Objective:   Physical Exam Vitals reviewed.  Constitutional:      General: She is not in acute distress.    Appearance: She is well-developed. She is obese.  HENT:     Head: Atraumatic. Microcephalic.     Right Ear: There is impacted cerumen.     Left Ear: There is impacted cerumen.     Mouth/Throat:     Dentition: Dental tenderness and dental caries present.     Pharynx: Posterior oropharyngeal erythema present.  Eyes:     Pupils: Pupils are equal, round, and reactive to light.  Neck:     Thyroid : No thyromegaly.  Cardiovascular:     Rate and Rhythm: Normal rate and regular rhythm.     Heart sounds: Normal heart sounds. No murmur heard. Pulmonary:     Effort: Pulmonary effort is normal. No respiratory distress.     Breath sounds: Normal breath sounds. No wheezing.  Abdominal:     General: Bowel sounds are normal. There is no distension.     Palpations: Abdomen is soft.     Tenderness: There is no abdominal tenderness.  Musculoskeletal:        General: No tenderness. Normal range of motion.     Cervical back: Normal range of motion and neck supple.  Skin:    General: Skin is warm and dry.  Neurological:     Mental Status: She is alert and oriented to person, place, and time.     Cranial Nerves: No cranial nerve deficit.     Deep Tendon Reflexes: Reflexes are normal and symmetric.  Psychiatric:        Mood and Affect: Affect is flat.        Behavior: Behavior normal.        Thought Content: Thought content normal.        Judgment: Judgment normal.       BP 119/73   Pulse 98   Temp (!) 96.8 F (36 C) (Temporal)   Ht 4' 9 (1.448 m)   Wt 214 lb (97.1 kg)   BMI 46.31 kg/m      Assessment & Plan:  Julie Carter comes in today with chief complaint of  Hypothyroidism   Diagnosis and orders addressed:  1. Hypothyroidism, postradioiodine therapy (Primary) - TSH  2. Morbid obesity (HCC) Encourage healthy diet and exercise  3. Vitamin D  deficiency   4. Down syndrome  5. Type 2 diabetes mellitus with other specified complication, without long-term current use of insulin  (HCC) Low  carb diet Continue Mounjaro   6. Depression, major, single episode, mild Continue Lexapro   7. Sore throat - Rapid Strep Screen ([facility] ONLY)  Labs pending Continue current medication  Continue Mounjaro  15 mg  Encouraged exercise, patient has gained weight since our last visit Health Maintenance reviewed Diet and exercise encouraged  Follow up plan: 3 months   Bari Learn, FNP

## 2023-11-18 NOTE — Patient Instructions (Signed)

## 2023-11-19 LAB — TSH: TSH: 10.2 u[IU]/mL — ABNORMAL HIGH (ref 0.450–4.500)

## 2023-11-21 ENCOUNTER — Other Ambulatory Visit: Payer: Self-pay | Admitting: Family

## 2023-11-21 MED ORDER — LEVOTHYROXINE SODIUM 125 MCG PO TABS: 125.0000 ug | ORAL_TABLET | Freq: Every day | ORAL | 1 refills | Status: AC

## 2023-12-04 ENCOUNTER — Ambulatory Visit: Payer: 59

## 2023-12-04 VITALS — BP 119/73 | HR 98 | Ht <= 58 in | Wt 214.0 lb

## 2023-12-04 DIAGNOSIS — Z0000 Encounter for general adult medical examination without abnormal findings: Secondary | ICD-10-CM

## 2023-12-04 NOTE — Progress Notes (Signed)
 No chief complaint on file.    Subjective:   Julie Carter is a 38 y.o. female who presents for a Medicare Annual Wellness Visit.  Visit info / Clinical Intake: Medicare Wellness Visit Type:: Subsequent Annual Wellness Visit Persons participating in visit and providing information:: patient Medicare Wellness Visit Mode:: Telephone If telephone:: video declined Since this visit was completed virtually, some vitals may be partially provided or unavailable. Missing vitals are due to the limitations of the virtual format.: Documented vitals are patient reported If Telephone or Video please confirm:: I connected with patient using audio/video enable telemedicine. I verified patient identity with two identifiers, discussed telehealth limitations, and patient agreed to proceed. Patient Location:: home Provider Location:: home office Interpreter Needed?: No Pre-visit prep was completed: yes AWV questionnaire completed by patient prior to visit?: no Living arrangements:: with family/others Patient's Overall Health Status Rating: very good Typical amount of pain: none Does pain affect daily life?: no Are you currently prescribed opioids?: no  Dietary Habits and Nutritional Risks How many meals a day?: 3 Eats fruit and vegetables daily?: yes Most meals are obtained by: preparing own meals In the last 2 weeks, have you had any of the following?: none Diabetic:: (!) yes Any non-healing wounds?: no How often do you check your BS?: 2 Would you like to be referred to a Nutritionist or for Diabetic Management? : no  Functional Status Activities of Daily Living (to include ambulation/medication): (!) Needs Assist (pt's mom helps pt) Feeding: Independent with device- listed below (pt's mom helps pt) Dressing/Grooming: Independent with device - see below (pt's mom helps pt) Bathing: Independent with device- listed below (pt's mom helps pt) Toileting: Independent with device- listed below  (pt's mom helps pt) Transfer: Independent with device- listed below (pt's mom helps pt) Ambulation: Independent with device- listed below (pt's mom helps pt) Medication Administration: Needs assistance (comment) (pt's mom helps pt) Home Management (perform basic housework or laundry): Needs assistance (comment) (pt's mom helps pt) Manage your own finances?: (!) no (pt's mom helps pt) Primary transportation is: family / friends (pt's mom helps pt) Concerns about hearing?: no  Fall Screening Falls in the past year?: 0 Number of falls in past year: 0 Was there an injury with Fall?: 0 Fall Risk Category Calculator: 0 Patient Fall Risk Level: Low Fall Risk  Fall Risk Patient at Risk for Falls Due to: No Fall Risks Fall risk Follow up: Falls evaluation completed; Education provided  Home and Transportation Safety: All rugs have non-skid backing?: yes All stairs or steps have railings?: yes Grab bars in the bathtub or shower?: yes Have non-skid surface in bathtub or shower?: yes Good home lighting?: yes Regular seat belt use?: yes Hospital stays in the last year:: no  Cognitive Assessment Difficulty concentrating, remembering, or making decisions? : no Will 6CIT or Mini Cog be Completed: no 6CIT or Mini Cog Declined: patient has a diagnosis of dementia or cognitive impairment  Advance Directives (For Healthcare) Does Patient Have a Medical Advance Directive?: No Would patient like information on creating a medical advance directive?: No - Patient declined  Reviewed/Updated  Reviewed/Updated: Reviewed All (Medical, Surgical, Family, Medications, Allergies, Care Teams, Patient Goals); Medical History; Surgical History; Family History; Medications; Allergies; Care Teams; Patient Goals    Allergies (verified) Patient has no known allergies.   Current Medications (verified) Outpatient Encounter Medications as of 12/04/2023  Medication Sig   ACCU-CHEK GUIDE test strip TEST UP TO 4  TIMES DAILY AS DIRECTED  Accu-Chek Softclix Lancets lancets Test BS up to 4 times daily dx E11.9   Blood Glucose Monitoring Suppl (ACCU-CHEK GUIDE ME) w/Device KIT USE UP TO FOUR TIMES DAILY AS DIRECTED.DX E11.9   DENTA 5000 PLUS 1.1 % CREA dental cream Take by mouth as directed.   empagliflozin  (JARDIANCE ) 25 MG TABS tablet TAKE 1 TABLET BY MOUTH DAILY BEFORE BREAKFAST.   escitalopram  (LEXAPRO ) 10 MG tablet TAKE 1 TABLET BY MOUTH EVERY DAY   levothyroxine  (SYNTHROID ) 125 MCG tablet Take 1 tablet (125 mcg total) by mouth daily.   tirzepatide  (MOUNJARO ) 15 MG/0.5ML Pen Inject 15 mg into the skin once a week.   triamcinolone  ointment (KENALOG ) 0.5 % Apply 1 Application topically 2 (two) times daily.   Vitamin D , Ergocalciferol , (DRISDOL ) 1.25 MG (50000 UNIT) CAPS capsule TAKE 1 CAPSULE BY MOUTH ONE TIME PER WEEK   No facility-administered encounter medications on file as of 12/04/2023.    History: Past Medical History:  Diagnosis Date   Cancer (HCC)    Congenital nystagmus    Diabetes mellitus    Down's syndrome    Heart murmur    Leukemia (HCC)    Past Surgical History:  Procedure Laterality Date   HERNIA REPAIR     umbilical   Family History  Problem Relation Age of Onset   Diabetes Mother    Hypertension Mother    Hypertension Father    Social History   Occupational History   Occupation: Disabled  Tobacco Use   Smoking status: Never   Smokeless tobacco: Never  Vaping Use   Vaping status: Never Used  Substance and Sexual Activity   Alcohol use: No    Alcohol/week: 0.0 standard drinks of alcohol   Drug use: No   Sexual activity: Never   Tobacco Counseling Counseling given: Yes  SDOH Screenings   Food Insecurity: No Food Insecurity (12/04/2023)  Housing: Unknown (12/04/2023)  Transportation Needs: No Transportation Needs (12/04/2023)  Utilities: Not At Risk (12/04/2023)  Alcohol Screen: Low Risk  (12/03/2022)  Depression (PHQ2-9): Low Risk  (12/04/2023)  Recent  Concern: Depression (PHQ2-9) - Medium Risk (09/16/2023)  Financial Resource Strain: Low Risk  (12/03/2022)  Physical Activity: Sufficiently Active (12/04/2023)  Social Connections: Socially Isolated (12/04/2023)  Stress: No Stress Concern Present (12/04/2023)  Tobacco Use: Low Risk  (12/04/2023)  Health Literacy: Inadequate Health Literacy (12/04/2023)   See flowsheets for full screening details  Depression Screen PHQ 2 & 9 Depression Scale- Over the past 2 weeks, how often have you been bothered by any of the following problems? Little interest or pleasure in doing things: 0 Feeling down, depressed, or hopeless (PHQ Adolescent also includes...irritable): 0 PHQ-2 Total Score: 0 Trouble falling or staying asleep, or sleeping too much: 0 Feeling tired or having little energy: 0 Poor appetite or overeating (PHQ Adolescent also includes...weight loss): 0 Feeling bad about yourself - or that you are a failure or have let yourself or your family down: 0 Trouble concentrating on things, such as reading the newspaper or watching television (PHQ Adolescent also includes...like school work): 1 Moving or speaking so slowly that other people could have noticed. Or the opposite - being so fidgety or restless that you have been moving around a lot more than usual: 0 Thoughts that you would be better off dead, or of hurting yourself in some way: 0 PHQ-9 Total Score: 5 If you checked off any problems, how difficult have these problems made it for you to do your work, take care of things  at home, or get along with other people?: Somewhat difficult  Depression Treatment Depression Interventions/Treatment : Counseling     Goals Addressed             This Visit's Progress    Stay Active               Objective:    Today's Vitals   12/04/23 1409  BP: 119/73  Pulse: 98  Weight: 214 lb (97.1 kg)  Height: 4' 9 (1.448 m)   Body mass index is 46.31 kg/m.  Hearing/Vision screen Hearing  Screening - Comments:: Per pt pt denies hearing dif Vision Screening - Comments:: Pt wear glasses/pt goes to [facility] [address] in [place]/last ov 2025 Immunizations and Health Maintenance Health Maintenance  Topic Date Due   OPHTHALMOLOGY EXAM  09/11/2023   Cervical Cancer Screening (HPV/Pap Cotest)  01/10/2024 (Originally 03/22/2018)   Hepatitis B Vaccines 19-59 Average Risk (1 of 3 - 19+ 3-dose series) 07/09/2024 (Originally 01/26/2003)   HPV VACCINES (1 - Risk 3-dose SCDM series) 07/09/2024 (Originally 01/26/2011)   COVID-19 Vaccine (1 - 2025-26 season) 10/01/2024 (Originally 09/02/2023)   Diabetic kidney evaluation - Urine ACR  01/11/2024   FOOT EXAM  01/11/2024   HEMOGLOBIN A1C  03/15/2024   Diabetic kidney evaluation - eGFR measurement  09/15/2024   Medicare Annual Wellness (AWV)  12/03/2024   DTaP/Tdap/Td (4 - Td or Tdap) 04/07/2029   Pneumococcal Vaccine (3 of 3 - PCV20 or PCV21) 01/25/2034   Influenza Vaccine  Completed   Hepatitis C Screening  Completed   HIV Screening  Completed   Meningococcal B Vaccine  Aged Out        Assessment/Plan:  This is a routine wellness examination for [place].  Patient Care Team: Lavell Bari LABOR, FNP as PCP - General (Family Medicine) Frances Ozell RAMAN, LCSW as [facility] Care Management (Licensed Clinical Social Worker)  I have personally reviewed and noted the following in the patient's chart:   Medical and social history Use of alcohol, tobacco or illicit drugs  Current medications and supplements including opioid prescriptions. Functional ability and status Nutritional status Physical activity Advanced directives List of other physicians Hospitalizations, surgeries, and ER visits in previous 12 months Vitals Screenings to include cognitive, depression, and falls Referrals and appointments  No orders of the defined types were placed in this encounter.  In addition, I have reviewed and discussed with patient certain  preventive protocols, quality metrics, and best practice recommendations. A written personalized care plan for preventive services as well as general preventive health recommendations were provided to patient.   Ozie Ned, CMA   12/04/2023   Return in 1 year (on 12/03/2024).  After Visit Summary: (MyChart) Due to this being a telephonic visit, the after visit summary with patients personalized plan was offered to patient via MyChart   Nurse Notes: pt's mom is aware pt is due the following: Diabetic eye exam--suggest to make appt soon to update, mom agreed.

## 2024-01-05 ENCOUNTER — Other Ambulatory Visit: Payer: Self-pay | Admitting: Family

## 2024-01-06 ENCOUNTER — Other Ambulatory Visit: Payer: Self-pay | Admitting: Family

## 2024-01-06 DIAGNOSIS — F411 Generalized anxiety disorder: Secondary | ICD-10-CM

## 2024-01-13 ENCOUNTER — Encounter: Payer: Self-pay | Admitting: Cardiology

## 2024-01-13 DIAGNOSIS — R011 Cardiac murmur, unspecified: Secondary | ICD-10-CM | POA: Insufficient documentation

## 2024-01-13 DIAGNOSIS — R072 Precordial pain: Secondary | ICD-10-CM | POA: Insufficient documentation

## 2024-01-13 NOTE — Progress Notes (Unsigned)
 "    Cardiology Office Note   Date:  01/15/2024   ID:  Julie Carter, Julie Carter 1985-12-09, MRN [id number]  PCP:  Lavell Bari LABOR, FNP  Cardiologist:   Lynwood Schilling, MD Referring:  Haze Lonni PARAS, MD  Chief Complaint  Patient presents with   Chest Pain      History of Present Illness: Julie Carter is a 39 y.o. female who presents for evaluation of precordial chest pain.  The patient has a history of Down syndrome.  Most of her history comes from her mom.  She states she did have chest pain but she cannot really describe the details of this and cannot quantify or qualify it.  This apparently happened in July.  She went to the emergency room and I did review these records for this visit.  This was thought to be nonanginal only GI.  Enzymes were negative.  EKG was nonacute.  She has not had a prior cardiac workup.  There was a history of a murmur when she was a child and her mom does not remember if she had workup as a baby but she was told there was a benign murmur.  She has never had any other cardiac complaints.  She is very sedentary.  She likes to read books.  She does not want to go up and do exercising.  Her mom says that since this event she has not been having any chest discomfort.  She has not been having any shortness of breath, PND or orthopnea.  Has been no apparent presyncope or syncope.  The been no reported palpitations.  She has no lower extremity swelling.  She has had no change in bowel or bladder.  She has had no cough fevers or chills.       Past Medical History:  Diagnosis Date   Cancer (HCC)    Congenital nystagmus    Diabetes mellitus    Down's syndrome    Leukemia (HCC)     Past Surgical History:  Procedure Laterality Date   HERNIA REPAIR     umbilical     Current Outpatient Medications  Medication Sig Dispense Refill   ACCU-CHEK GUIDE test strip TEST UP TO 4 TIMES DAILY AS DIRECTED 100 strip 3   Accu-Chek Softclix Lancets lancets Test BS up to 4  times daily dx E11.9 400 each 3   Blood Glucose Monitoring Suppl (ACCU-CHEK GUIDE ME) w/Device KIT USE UP TO FOUR TIMES DAILY AS DIRECTED.DX E11.9 1 kit 0   DENTA 5000 PLUS 1.1 % CREA dental cream Take by mouth as directed.     escitalopram  (LEXAPRO ) 10 MG tablet TAKE 1 TABLET BY MOUTH EVERY DAY 90 tablet 0   JARDIANCE  25 MG TABS tablet TAKE 1 TABLET BY MOUTH DAILY BEFORE BREAKFAST. 90 tablet 0   levothyroxine  (SYNTHROID ) 125 MCG tablet Take 1 tablet (125 mcg total) by mouth daily. 90 tablet 1   tirzepatide  (MOUNJARO ) 15 MG/0.5ML Pen Inject 15 mg into the skin once a week. 6 mL 3   triamcinolone  ointment (KENALOG ) 0.5 % Apply 1 Application topically 2 (two) times daily. 30 g 0   Vitamin D , Ergocalciferol , (DRISDOL ) 1.25 MG (50000 UNIT) CAPS capsule TAKE 1 CAPSULE BY MOUTH ONE TIME PER WEEK 12 capsule 3   No current facility-administered medications for this visit.    Allergies:   Patient has no known allergies.    Social History:  The patient  reports that she has never smoked. She has never  used smokeless tobacco. She reports that she does not drink alcohol and does not use drugs.   Family History:  The patient's family history includes Diabetes in her mother; Hypertension in her father and mother.    ROS:  Please see the history of present illness.   Otherwise, review of systems are positive for none.   All other systems are reviewed and negative.    PHYSICAL EXAM: VS:  BP 119/82 (BP Location: Left Arm, Patient Position: Sitting, Cuff Size: Large)   Pulse 85   Resp 16   Ht 4' 9 (1.448 m)   Wt 216 lb 3.2 oz (98.1 kg)   SpO2 93%   BMI 46.79 kg/m  , BMI Body mass index is 46.79 kg/m. GENERAL:  Well appearing HEENT:  Pupils equal round and reactive, fundi not visualized, oral mucosa unremarkable NECK:  No jugular venous distention, waveform within normal limits, carotid upstroke brisk and symmetric, no bruits, no thyromegaly LYMPHATICS:  No cervical, inguinal adenopathy LUNGS:   Clear to auscultation bilaterally BACK:  No CVA tenderness CHEST:  Unremarkable HEART:  PMI not displaced or sustained,S1 and S2 within normal limits, no S3, no S4, no clicks, no rubs, no murmurs ABD:  Flat, positive bowel sounds normal in frequency in pitch, no bruits, no rebound, no guarding, no midline pulsatile mass, no hepatomegaly, no splenomegaly EXT:  2 plus pulses throughout, no edema, no cyanosis no clubbing SKIN:  No rashes no nodules NEURO:  Cranial nerves II through XII grossly intact, motor grossly intact throughout [facility]:  Cognitively intact, oriented to person place and time    EKG:  EKG Interpretation Date/Time:  Wednesday January 15 2024 10:13:40 EST Ventricular Rate:  83 PR Interval:  124 QRS Duration:  84 QT Interval:  380 QTC Calculation: 446 R Axis:   -9  Text Interpretation: Normal sinus rhythm Low voltage QRS Poor anterior R wave progression When compared with ECG of 30-Jul-2023 11:14, No significant change since last tracing Confirmed by Lavona Rattan ([id number]) on 01/15/2024 10:22:37 AM     Recent Labs: 07/30/2023: Hemoglobin 13.8; Platelets 292 09/16/2023: ALT 16; BUN 11; Creatinine, Ser 0.89; Potassium 4.3; Sodium 141 11/18/2023: TSH 10.200    Lipid Panel    Component Value Date/Time   CHOL 192 01/11/2023 0915   CHOL 156 07/15/2012 1229   TRIG 139 01/11/2023 0915   TRIG 152 (H) 07/15/2012 1229   HDL 54 01/11/2023 0915   HDL 50 07/15/2012 1229   CHOLHDL 3.6 01/11/2023 0915   CHOLHDL 3 09/23/2018 1409   VLDL 21.8 09/23/2018 1409   LDLCALC 113 (H) 01/11/2023 0915   LDLCALC 76 07/15/2012 1229      Wt Readings from Last 3 Encounters:  01/15/24 216 lb 3.2 oz (98.1 kg)  12/04/23 214 lb (97.1 kg)  11/18/23 214 lb (97.1 kg)      Other studies Reviewed: Additional studies/ records that were reviewed today include: ED records. Review of the above records demonstrates:  Please see elsewhere in the note.     ASSESSMENT AND PLAN:   Precordial  chest pain: Her chest discomfort was an isolated episode in the summer and her mom says it is not happening anymore.  There is no objective evidence of ischemia.  She does have risk factors but in the absence of more conclusive suggestive findings of obstructive coronary disease I do not think further cardiovascular testing is warranted.  Her mom and let me know if any recurrent symptoms.  Murmur: Certainly she could have  congenital abnormalities related to Down's but I do not hear a murmur and this was apparently evaluated as a child.  No further workup.  She is not having any symptoms.  Current medicines are reviewed at length with the patient today.  The patient does not have concerns regarding medicines.  The following changes have been made:  no change  Labs/ tests ordered today include:   Orders Placed This Encounter  Procedures   EKG 12-Lead     Disposition:   FU with me as needed.     Signed, Lynwood Schilling, MD  01/15/2024 10:38 AM    [facility]    "

## 2024-01-15 ENCOUNTER — Ambulatory Visit: Admitting: Cardiology

## 2024-01-15 ENCOUNTER — Encounter: Payer: Self-pay | Admitting: Cardiology

## 2024-01-15 VITALS — BP 119/82 | HR 85 | Resp 16 | Ht <= 58 in | Wt 216.2 lb

## 2024-01-15 DIAGNOSIS — R011 Cardiac murmur, unspecified: Secondary | ICD-10-CM | POA: Diagnosis not present

## 2024-01-15 DIAGNOSIS — R072 Precordial pain: Secondary | ICD-10-CM

## 2024-01-15 NOTE — Patient Instructions (Addendum)

## 2024-01-21 ENCOUNTER — Ambulatory Visit: Admitting: Family

## 2024-02-18 ENCOUNTER — Ambulatory Visit: Admitting: Family

## 2024-03-06 ENCOUNTER — Ambulatory Visit: Admitting: Podiatry

## 2024-03-17 ENCOUNTER — Ambulatory Visit: Payer: Self-pay | Admitting: Family

## 2024-12-04 ENCOUNTER — Ambulatory Visit

## 2024-12-07 ENCOUNTER — Ambulatory Visit
